# Patient Record
Sex: Female | Born: 1960 | Race: White | Hispanic: No | Marital: Married | Smoking: Current every day smoker
Health system: Southern US, Community
[De-identification: ages and names within clinical notes are randomized; demographics above are authoritative.]

## PROBLEM LIST (undated history)

## (undated) ENCOUNTER — Emergency Department (HOSPITAL_COMMUNITY): Discharge status: Absent without leave | Payer: 59

## (undated) DIAGNOSIS — F419 Anxiety disorder, unspecified: Secondary | ICD-10-CM

## (undated) DIAGNOSIS — J45909 Unspecified asthma, uncomplicated: Secondary | ICD-10-CM

## (undated) DIAGNOSIS — F329 Major depressive disorder, single episode, unspecified: Secondary | ICD-10-CM

## (undated) DIAGNOSIS — L509 Urticaria, unspecified: Secondary | ICD-10-CM

## (undated) DIAGNOSIS — L309 Dermatitis, unspecified: Secondary | ICD-10-CM

## (undated) DIAGNOSIS — F32A Depression, unspecified: Secondary | ICD-10-CM

## (undated) HISTORY — DX: Urticaria, unspecified: L50.9

## (undated) HISTORY — PX: TUBAL LIGATION: SHX77

## (undated) HISTORY — DX: Dermatitis, unspecified: L30.9

## (undated) HISTORY — DX: Unspecified asthma, uncomplicated: J45.909

## (undated) HISTORY — DX: Anxiety disorder, unspecified: F41.9

---

## 2001-12-15 ENCOUNTER — Encounter: Payer: Self-pay | Admitting: Obstetrics and Gynecology

## 2001-12-15 ENCOUNTER — Ambulatory Visit (HOSPITAL_COMMUNITY): Admission: RE | Admit: 2001-12-15 | Discharge: 2001-12-15 | Payer: Self-pay | Admitting: Internal Medicine

## 2005-05-24 ENCOUNTER — Ambulatory Visit (HOSPITAL_COMMUNITY): Admission: RE | Admit: 2005-05-24 | Discharge: 2005-05-24 | Payer: Self-pay | Admitting: Unknown Physician Specialty

## 2007-04-09 ENCOUNTER — Ambulatory Visit (HOSPITAL_COMMUNITY): Admission: RE | Admit: 2007-04-09 | Discharge: 2007-04-09 | Payer: Self-pay | Admitting: Unknown Physician Specialty

## 2008-10-06 ENCOUNTER — Ambulatory Visit (HOSPITAL_COMMUNITY): Admission: RE | Admit: 2008-10-06 | Discharge: 2008-10-06 | Payer: Self-pay | Admitting: Unknown Physician Specialty

## 2009-10-07 ENCOUNTER — Ambulatory Visit (HOSPITAL_COMMUNITY): Admission: RE | Admit: 2009-10-07 | Discharge: 2009-10-07 | Payer: Self-pay | Admitting: Unknown Physician Specialty

## 2009-10-20 ENCOUNTER — Ambulatory Visit (HOSPITAL_COMMUNITY): Admission: RE | Admit: 2009-10-20 | Discharge: 2009-10-20 | Payer: Self-pay | Admitting: Unknown Physician Specialty

## 2010-10-24 ENCOUNTER — Ambulatory Visit (HOSPITAL_COMMUNITY)
Admission: RE | Admit: 2010-10-24 | Discharge: 2010-10-24 | Payer: Self-pay | Source: Home / Self Care | Admitting: Unknown Physician Specialty

## 2011-09-27 ENCOUNTER — Other Ambulatory Visit (HOSPITAL_COMMUNITY): Payer: Self-pay | Admitting: Physician Assistant

## 2011-09-27 DIAGNOSIS — Z139 Encounter for screening, unspecified: Secondary | ICD-10-CM

## 2011-10-29 ENCOUNTER — Ambulatory Visit (HOSPITAL_COMMUNITY)
Admission: RE | Admit: 2011-10-29 | Discharge: 2011-10-29 | Disposition: A | Discharge status: Home / Self Care | Payer: 59 | Source: Ambulatory Visit | Attending: Physician Assistant | Admitting: Physician Assistant

## 2011-10-29 DIAGNOSIS — Z139 Encounter for screening, unspecified: Secondary | ICD-10-CM

## 2011-10-29 DIAGNOSIS — Z1231 Encounter for screening mammogram for malignant neoplasm of breast: Secondary | ICD-10-CM | POA: Insufficient documentation

## 2012-10-22 ENCOUNTER — Other Ambulatory Visit (HOSPITAL_COMMUNITY): Payer: Self-pay | Admitting: Physician Assistant

## 2012-10-22 DIAGNOSIS — Z139 Encounter for screening, unspecified: Secondary | ICD-10-CM

## 2012-11-03 ENCOUNTER — Ambulatory Visit (HOSPITAL_COMMUNITY)
Admission: RE | Admit: 2012-11-03 | Discharge: 2012-11-03 | Disposition: A | Discharge status: Home / Self Care | Payer: 59 | Source: Ambulatory Visit | Attending: Physician Assistant | Admitting: Physician Assistant

## 2012-11-03 DIAGNOSIS — Z1231 Encounter for screening mammogram for malignant neoplasm of breast: Secondary | ICD-10-CM | POA: Insufficient documentation

## 2012-11-03 DIAGNOSIS — Z139 Encounter for screening, unspecified: Secondary | ICD-10-CM

## 2013-03-25 ENCOUNTER — Encounter (INDEPENDENT_AMBULATORY_CARE_PROVIDER_SITE_OTHER): Payer: Self-pay | Admitting: *Deleted

## 2013-04-02 ENCOUNTER — Encounter (INDEPENDENT_AMBULATORY_CARE_PROVIDER_SITE_OTHER): Payer: Self-pay | Admitting: *Deleted

## 2013-04-02 ENCOUNTER — Telehealth (INDEPENDENT_AMBULATORY_CARE_PROVIDER_SITE_OTHER): Payer: Self-pay | Admitting: *Deleted

## 2013-04-02 ENCOUNTER — Other Ambulatory Visit (INDEPENDENT_AMBULATORY_CARE_PROVIDER_SITE_OTHER): Payer: Self-pay | Admitting: *Deleted

## 2013-04-02 DIAGNOSIS — Z1211 Encounter for screening for malignant neoplasm of colon: Secondary | ICD-10-CM

## 2013-04-02 NOTE — Telephone Encounter (Signed)
Patient needs movi prep 

## 2013-04-03 MED ORDER — PEG-KCL-NACL-NASULF-NA ASC-C 100 G PO SOLR: 1.0000 | Freq: Once | ORAL | Status: DC

## 2013-05-11 ENCOUNTER — Encounter (HOSPITAL_COMMUNITY): Payer: Self-pay | Admitting: Pharmacy Technician

## 2013-05-18 ENCOUNTER — Telehealth (INDEPENDENT_AMBULATORY_CARE_PROVIDER_SITE_OTHER): Payer: Self-pay | Admitting: *Deleted

## 2013-05-18 NOTE — Telephone Encounter (Signed)
agree

## 2013-05-18 NOTE — Telephone Encounter (Signed)
  Procedure: tcs  Reason/Indication:  screening  Has patient had this procedure before?  no  If so, when, by whom and where?    Is there a family history of colon cancer?  no  Who?  What age when diagnosed?    Is patient diabetic?   no      Does patient have prosthetic heart valve?  no  Do you have a pacemaker?  no  Has patient ever had endocarditis? no  Has patient had joint replacement within last 12 months?  no  Is patient on Coumadin, Plavix and/or Aspirin? no  Medications: goody powder daily, alprazolam 1 mg daily, zoloft 150 mg, vitamin, zyrtec daily  Allergies: Ceftin, codeine, PCN  Medication Adjustment: goody powder 2 days  Procedure date & time: 06/17/13 at 730

## 2013-06-17 ENCOUNTER — Ambulatory Visit (HOSPITAL_COMMUNITY)
Admission: RE | Admit: 2013-06-17 | Discharge: 2013-06-17 | Disposition: A | Discharge status: Home / Self Care | Payer: 59 | Source: Ambulatory Visit | Attending: Internal Medicine | Admitting: Internal Medicine

## 2013-06-17 ENCOUNTER — Encounter (HOSPITAL_COMMUNITY): Payer: Self-pay | Admitting: *Deleted

## 2013-06-17 ENCOUNTER — Encounter (HOSPITAL_COMMUNITY)
Admission: RE | Disposition: A | Discharge status: Home / Self Care | Payer: Self-pay | Source: Ambulatory Visit | Attending: Internal Medicine

## 2013-06-17 DIAGNOSIS — Z1211 Encounter for screening for malignant neoplasm of colon: Secondary | ICD-10-CM

## 2013-06-17 DIAGNOSIS — D126 Benign neoplasm of colon, unspecified: Secondary | ICD-10-CM | POA: Insufficient documentation

## 2013-06-17 DIAGNOSIS — Q438 Other specified congenital malformations of intestine: Secondary | ICD-10-CM | POA: Insufficient documentation

## 2013-06-17 DIAGNOSIS — K6389 Other specified diseases of intestine: Secondary | ICD-10-CM | POA: Insufficient documentation

## 2013-06-17 DIAGNOSIS — Z83719 Family history of colon polyps, unspecified: Secondary | ICD-10-CM | POA: Insufficient documentation

## 2013-06-17 DIAGNOSIS — K633 Ulcer of intestine: Secondary | ICD-10-CM

## 2013-06-17 DIAGNOSIS — Z8371 Family history of colonic polyps: Secondary | ICD-10-CM | POA: Insufficient documentation

## 2013-06-17 HISTORY — PX: COLONOSCOPY: SHX5424

## 2013-06-17 HISTORY — DX: Depression, unspecified: F32.A

## 2013-06-17 HISTORY — DX: Major depressive disorder, single episode, unspecified: F32.9

## 2013-06-17 SURGERY — COLONOSCOPY
Anesthesia: Moderate Sedation

## 2013-06-17 MED ORDER — MEPERIDINE HCL 50 MG/ML IJ SOLN
INTRAMUSCULAR | Status: AC
  Filled 2013-06-17: qty 1

## 2013-06-17 MED ORDER — MIDAZOLAM HCL 5 MG/5ML IJ SOLN
INTRAMUSCULAR | Status: DC | PRN
  Administered 2013-06-17: 1 mg via INTRAVENOUS
  Administered 2013-06-17: 3 mg via INTRAVENOUS
  Administered 2013-06-17 (×3): 2 mg via INTRAVENOUS

## 2013-06-17 MED ORDER — MEPERIDINE HCL 50 MG/ML IJ SOLN
INTRAMUSCULAR | Status: DC | PRN
  Administered 2013-06-17 (×2): 25 mg via INTRAVENOUS

## 2013-06-17 MED ORDER — STERILE WATER FOR IRRIGATION IR SOLN
Status: DC | PRN
  Administered 2013-06-17: 07:00:00

## 2013-06-17 MED ORDER — SODIUM CHLORIDE 0.9 % IV SOLN
INTRAVENOUS | Status: DC
  Administered 2013-06-17: 1000 mL via INTRAVENOUS

## 2013-06-17 MED ORDER — MIDAZOLAM HCL 5 MG/5ML IJ SOLN
INTRAMUSCULAR | Status: AC
  Filled 2013-06-17: qty 10

## 2013-06-17 NOTE — Op Note (Signed)
COLONOSCOPY PROCEDURE REPORT  PATIENT:  Christina Esparza  MR#:  [id number] Birthdate:  Jan 03, 1961, 52 y.o., female Endoscopist:  Dr. Malissa Hippo, MD Referred By:  Ms. Tanja Port, FNP Procedure Date: 06/17/2013  Procedure:   Colonoscopy  Indications:  Patient is 52 year-old Caucasian female who is undergoing average risk screening colonoscopy. Family history is negative for CRC but her mother has had colonic polyps removed.  Informed Consent:  The procedure and risks were reviewed with the patient and informed consent was obtained.  Medications:  Demerol 50 mg IV Versed 10 mg IV  Description of procedure:  After a digital rectal exam was performed, that colonoscope was advanced from the anus through the rectum and colon to the area of the cecum, ileocecal valve and appendiceal orifice. The cecum was deeply intubated. These structures were well-seen and photographed for the record. From the level of the cecum and ileocecal valve, the scope was slowly and cautiously withdrawn. The mucosal surfaces were carefully surveyed utilizing scope tip to flexion to facilitate fold flattening as needed. The scope was pulled down into the rectum where a thorough exam including retroflexion was performed.  Findings:   Prep satisfactory. Tortuous sigmoid colon. Single small cecal erosion possibly secondary to aspirin use. Two small polyps ablated via cold biopsy and submitted together. These are located at the descending and sigmoid colon. Normal rectal mucosa and anal rectal junction. Slim colonoscope used in order to advance scope through tortuous sigmoid colon.   Therapeutic/Diagnostic Maneuvers Performed:  See above  Complications:  None  Cecal Withdrawal Time:  17 minutes  Impression:  Examination performed to cecum. Single small cecal erosion possibly secondary to aspirin use. Two small polyps ablated via cold biopsy and submitted together(descending and sigmoid  colon).   Recommendations:  Standard instructions given. I will contact patient with biopsy results and further recommendations.  Christina Esparza  06/17/2013 8:18 AM  CC: Dr. Phillips Odor, Chancy Hurter, MD & Dr. Bonnetta Barry ref. provider found

## 2013-06-17 NOTE — H&P (Signed)
Christina Esparza is an 52 y.o. female.   Chief Complaint: Patient is here for colonoscopy. HPI: Patient is-year-old Caucasian female with screening examination. She denies rectal bleeding tarry or constipation abdominal pain. Family history is negative for colorectal carcinoma her mother for a few polyps removed.  Past Medical History  Diagnosis Date  . Depression     Past Surgical History  Procedure Laterality Date  . Tubal ligation      History reviewed. No pertinent family history. Social History:  reports that she has been smoking Cigarettes.  She has been smoking about 1.50 packs per day. She does not have any smokeless tobacco history on file. She reports that she does not drink alcohol or use illicit drugs.  Allergies:  Allergies  Allergen Reactions  . Ceftin (Cefuroxime) Itching  . Codeine Nausea And Vomiting  . Penicillins     Felt like she was choking    Medications Prior to Admission  Medication Sig Dispense Refill  . ALPRAZolam (XANAX) 0.5 MG tablet Take 0.5 mg by mouth every 6 (six) hours as needed for anxiety.      . Aspirin-Acetaminophen (GOODYS BODY PAIN PO) Take 1 packet by mouth every 6 (six) hours as needed (pain).      . cetirizine (ZYRTEC) 10 MG tablet Take 10 mg by mouth daily.      . sertraline (ZOLOFT) 100 MG tablet Take 150 mg by mouth daily.        No results found for this or any previous visit (from the past 48 hour(s)). No results found.  ROS  Blood pressure 116/71, pulse 70, temperature 97.7 F (36.5 C), temperature source Oral, resp. rate 19, height 5\' 1"  (1.549 m), weight 135 lb (61.236 kg), SpO2 92.00%. Physical Exam  Constitutional: She appears well-developed and well-nourished.  HENT:  Mouth/Throat: Oropharynx is clear and moist.  Eyes: Conjunctivae are normal. No scleral icterus.  Neck: No thyromegaly present.  Cardiovascular: Normal rate, regular rhythm and normal heart sounds.   No murmur heard. Respiratory: Effort normal and  breath sounds normal.  GI: Soft. She exhibits no distension and no mass. There is no tenderness.  Musculoskeletal: She exhibits no edema.  Lymphadenopathy:    She has no cervical adenopathy.  Neurological: She is alert.  Skin: Skin is warm and dry.     Assessment/Plan Average risk screening colonoscopy.  REHMAN,NAJEEB U 06/17/2013, 7:25 AM

## 2013-06-18 ENCOUNTER — Encounter (HOSPITAL_COMMUNITY): Payer: Self-pay | Admitting: Internal Medicine

## 2013-06-22 ENCOUNTER — Encounter (INDEPENDENT_AMBULATORY_CARE_PROVIDER_SITE_OTHER): Payer: Self-pay | Admitting: *Deleted

## 2013-06-25 ENCOUNTER — Telehealth (INDEPENDENT_AMBULATORY_CARE_PROVIDER_SITE_OTHER): Payer: Self-pay | Admitting: *Deleted

## 2013-06-25 NOTE — Telephone Encounter (Signed)
Christina Esparza had a TCS last Wed., 06/17/13, and is having a lot of gas. She is having trouble passing gas at times, her stomash just rolls. Would like to know what she should do. The return phone number is [phone].

## 2013-06-25 NOTE — Telephone Encounter (Signed)
She is passing gas at this time. She does not need to take Gas X.

## 2013-10-12 ENCOUNTER — Other Ambulatory Visit (HOSPITAL_COMMUNITY): Payer: Self-pay | Admitting: Unknown Physician Specialty

## 2013-10-12 DIAGNOSIS — Z139 Encounter for screening, unspecified: Secondary | ICD-10-CM

## 2013-11-05 ENCOUNTER — Ambulatory Visit (HOSPITAL_COMMUNITY)
Admission: RE | Admit: 2013-11-05 | Discharge: 2013-11-05 | Disposition: A | Discharge status: Home / Self Care | Payer: 59 | Source: Ambulatory Visit | Attending: Unknown Physician Specialty | Admitting: Unknown Physician Specialty

## 2013-11-05 DIAGNOSIS — Z1231 Encounter for screening mammogram for malignant neoplasm of breast: Secondary | ICD-10-CM | POA: Insufficient documentation

## 2013-11-05 DIAGNOSIS — Z139 Encounter for screening, unspecified: Secondary | ICD-10-CM

## 2013-12-15 ENCOUNTER — Telehealth (INDEPENDENT_AMBULATORY_CARE_PROVIDER_SITE_OTHER): Payer: Self-pay | Admitting: *Deleted

## 2013-12-15 NOTE — Telephone Encounter (Addendum)
Christina Esparza has been having gas since her las TCS. It feels like a knot of gas in her right side rolling around. It starts up right after she eats and has tried everything OTC. Her return phone number is [phone].

## 2013-12-15 NOTE — Telephone Encounter (Signed)
She needs upper abdominal ultrasound to rule out cholelithiasis. If ultrasound is negative she will need office visit. Her colonoscopy was 6 months ago; doubt that her flatulence has anything to do with that procedure. Please call patient

## 2013-12-15 NOTE — Telephone Encounter (Signed)
Forwarded to [place] for review/ recommendation.

## 2013-12-15 NOTE — Telephone Encounter (Signed)
I called the patient to give to her the recommendations per Dr.Rehman. She states that she already had trouble with constipation prior to her Colonoscopy. Since the procedure, when she eats she has pain on her right side and actually feels the stool or something per the patient moving. Eventually she has a BM , and feels better. Then the cycle starts all over again. She states that she has only taken OTC medications for Gas, nothing for Constipation. I ask the patient to take Colace 2 tonight , and that I would address this with Dr.Rehman as he may have further recommendations, Patient will be called tomorrow.

## 2013-12-16 NOTE — Telephone Encounter (Signed)
Per Dr.Rehman the patient may take 1/2 - 1 scoop daily of Miralax. Patient is to let us know via a progress report how she is doing, if the pain continues we will precede with the ultrasound. Patient was called and made aware.

## 2013-12-17 NOTE — Telephone Encounter (Signed)
Patient symptoms possibly related to constipation. She will try MiraLax. Please see Christina Esparza,s note for details the

## 2013-12-28 ENCOUNTER — Telehealth (INDEPENDENT_AMBULATORY_CARE_PROVIDER_SITE_OTHER): Payer: Self-pay | Admitting: *Deleted

## 2013-12-28 NOTE — Telephone Encounter (Signed)
Tanaiya said she needed to speak with [name]. She was to return her all a couple a weeks ago. Her return phone number is [phone]. A telephone encounter is noted for date 12/15/13.

## 2013-12-29 NOTE — Telephone Encounter (Signed)
Azaela returned [name]'s call. If [name] would please try her back.

## 2013-12-29 NOTE — Telephone Encounter (Signed)
Patient was called and I rec'd her voice mail. A message was left for her to call the office back.

## 2013-12-30 NOTE — Telephone Encounter (Signed)
Patient can take MiraLax up to one scoop daily. Please note that she is taking fiber supplement as well.

## 2013-12-30 NOTE — Telephone Encounter (Signed)
Patient states that she has been taking 1/2 scoop of the Miralax daily. It seems to be working. When she eats she does feel a knot/spasm but she feels that the knot has moved.. She is having bowel movements , and she is also able to pass gas now. She also ask if she should take more of the Miralax. Dr.Rehman's note reviewed and the patient ws advised that he had states that she could use 1/2 to 1 scoop. If further recommendations, patient will be called back.

## 2013-12-31 NOTE — Telephone Encounter (Signed)
Patient was called and made aware. 

## 2014-09-29 ENCOUNTER — Other Ambulatory Visit (HOSPITAL_COMMUNITY): Payer: Self-pay | Admitting: Family Medicine

## 2014-09-29 ENCOUNTER — Ambulatory Visit (HOSPITAL_COMMUNITY)
Admission: RE | Admit: 2014-09-29 | Discharge: 2014-09-29 | Disposition: A | Discharge status: Home / Self Care | Payer: 59 | Source: Ambulatory Visit | Attending: Family Medicine | Admitting: Family Medicine

## 2014-09-29 DIAGNOSIS — R05 Cough: Secondary | ICD-10-CM | POA: Insufficient documentation

## 2014-09-29 DIAGNOSIS — R0989 Other specified symptoms and signs involving the circulatory and respiratory systems: Secondary | ICD-10-CM | POA: Diagnosis not present

## 2014-09-29 DIAGNOSIS — J069 Acute upper respiratory infection, unspecified: Secondary | ICD-10-CM

## 2014-10-22 ENCOUNTER — Other Ambulatory Visit (HOSPITAL_COMMUNITY): Payer: Self-pay | Admitting: Unknown Physician Specialty

## 2014-10-22 DIAGNOSIS — Z1231 Encounter for screening mammogram for malignant neoplasm of breast: Secondary | ICD-10-CM

## 2014-11-08 ENCOUNTER — Ambulatory Visit (HOSPITAL_COMMUNITY)
Admission: RE | Admit: 2014-11-08 | Discharge: 2014-11-08 | Disposition: A | Discharge status: Home / Self Care | Payer: 59 | Source: Ambulatory Visit | Attending: Unknown Physician Specialty | Admitting: Unknown Physician Specialty

## 2014-11-08 DIAGNOSIS — Z1231 Encounter for screening mammogram for malignant neoplasm of breast: Secondary | ICD-10-CM | POA: Insufficient documentation

## 2015-05-16 ENCOUNTER — Encounter (INDEPENDENT_AMBULATORY_CARE_PROVIDER_SITE_OTHER): Payer: Self-pay | Admitting: Internal Medicine

## 2015-05-16 ENCOUNTER — Ambulatory Visit (INDEPENDENT_AMBULATORY_CARE_PROVIDER_SITE_OTHER): Payer: 59 | Admitting: Internal Medicine

## 2015-05-16 VITALS — BP 104/58 | HR 72 | Temp 98.1°F | Ht 61.0 in | Wt 127.7 lb

## 2015-05-16 DIAGNOSIS — A059 Bacterial foodborne intoxication, unspecified: Secondary | ICD-10-CM | POA: Diagnosis not present

## 2015-05-16 NOTE — Patient Instructions (Signed)
OV in 4 weeks.  

## 2015-05-16 NOTE — Progress Notes (Signed)
Subjective:    Patient ID: Christina Esparza, female    DOB: June 30, 1961, 54 y.o.   MRN: [id number]  HPI Presents today with c/o that 2 1/2 months ago, she ate eggs, and gravy. An hour she became sick and nauseated. The next day she had diarrhea. She says every thing she ate would come back up into her mouth. Symptoms lasted for about 2 weeks.  She saw her GYN  and was given Omeprazole which helped.  After starting the Omeprazole she felt better. She had diarrhea for 5 days.  Later she was treated for a sinus infection.She c/o headache, and some hearing loss.  Started on a Z pak by Dr. Hilma Favors.   She says her mouth was on fire. She followed up with her dentist Clotrimazole 10mg  which helped. Her appetite is good.  There has been no recent diarrhea.  She has no GI complaints today.       06/17/2013:  Colonoscopy  Indications: Patient is 54 year-old Caucasian female who is undergoing average risk screening colonoscopy. Family history is negative for CRC but her mother has had colonic polyps removed.  Impression:  Examination performed to cecum. Single small cecal erosion possibly secondary to aspirin use. Two small polyps ablated via cold biopsy and submitted together(descending and sigmoid colon).  She had 2 small polyps removed and there non-adenomatous. Results reviewed with patient. Will bring her back for her next exam in 7 years because of mother's history significant for advanced adenoma.   Review of Systems Married. No children. Unemployed.    Past Medical History  Diagnosis Date  . Depression     Past Surgical History  Procedure Laterality Date  . Tubal ligation    . Colonoscopy N/A 06/17/2013    Procedure: COLONOSCOPY;  Surgeon: Rogene Houston, MD;  Location: [facility];  Service: Endoscopy;  Laterality: N/A;  730    Allergies  Allergen Reactions  . Ceftin [Cefuroxime] Itching  . Codeine Nausea And Vomiting  . Levaquin [Levofloxacin In D5w]     dream  .  Penicillins     Felt like she was choking    Current Outpatient Prescriptions on File Prior to Visit  Medication Sig Dispense Refill  . ALPRAZolam (XANAX) 0.5 MG tablet Take 0.5 mg by mouth every 6 (six) hours as needed for anxiety.    . Aspirin-Acetaminophen (GOODYS BODY PAIN PO) Take 1 packet by mouth every 6 (six) hours as needed (pain).    . cetirizine (ZYRTEC) 10 MG tablet Take 10 mg by mouth daily.    . sertraline (ZOLOFT) 100 MG tablet Take 150 mg by mouth daily.     No current facility-administered medications on file prior to visit.     Objective:   Physical ExamBlood pressure 104/58, pulse 72, temperature 98.1 F (36.7 C), height 5\' 1"  (1.549 m), weight 127 lb 11.2 oz (57.924 kg).  Alert and oriented. Skin warm and dry. Oral mucosa is moist.   . Sclera anicteric, conjunctivae is pink. Thyroid not enlarged. No cervical lymphadenopathy. Lungs clear. Heart regular rate and rhythm.  Abdomen is soft. Bowel sounds are positive. No hepatomegaly. No abdominal masses felt. No tenderness.  No edema to lower extremities.         Assessment & Plan:  GERD. She possible had food poisoning. No problems at this times. She was recently treated for a sinus infection with a Z pak.  She is taking Omeprazole daily. OV 4 weeks to be sure all of  her symptoms have resolved.

## 2015-06-13 ENCOUNTER — Encounter (INDEPENDENT_AMBULATORY_CARE_PROVIDER_SITE_OTHER): Payer: Self-pay | Admitting: Internal Medicine

## 2015-06-13 ENCOUNTER — Ambulatory Visit (INDEPENDENT_AMBULATORY_CARE_PROVIDER_SITE_OTHER): Payer: 59 | Admitting: Internal Medicine

## 2015-06-13 VITALS — BP 118/54 | HR 80 | Temp 98.2°F | Ht 61.0 in | Wt 132.0 lb

## 2015-06-13 DIAGNOSIS — B370 Candidal stomatitis: Secondary | ICD-10-CM

## 2015-06-13 NOTE — Progress Notes (Signed)
   Subjective:    Patient ID: Christina Esparza, female    DOB: [birth year], 54 y.o.   MRN: [id number]  HPIHere today for f/u. She was last seen in June for nausea and diarrhea after eating eggs and gravy.   Her symptoms last for about 2 weeks. She says she thinks she had food poison.  When in office she had no GI complaints.   She tells me now she feels like she has a lot of acid in her mouth. She says her lips are cracked at the corners. She says she cannot eat tomatoes or drink diet Pepsi.  She has burning in her mouth and cheeks.  She tells me she saw Dr Hilma Favors and was treated for a sinus infection with a Z Pak.  She says her mouth has been on fire since she became sick in June. .  She followed up with her dentist Clotrimazole 10mg  which helped.  She has gained 5 pounds since her visit in June.  She denies acid reflux.   06/17/2013 Colonoscopy Indications: Patient is 54 year-old Caucasian female who is undergoing average risk screening colonoscopy. Family history is negative for CRC but her mother has had colonic polyps removed.  Impression:  Examination performed to cecum. Single small cecal erosion possibly secondary to aspirin use. Two small polyps ablated via cold biopsy and submitted together(descending and sigmoid colon).  Review of Systems Past Medical History  Diagnosis Date  . Depression     Past Surgical History  Procedure Laterality Date  . Tubal ligation    . Colonoscopy N/A 06/17/2013    Procedure: COLONOSCOPY;  Surgeon: Rogene Houston, MD;  Location: [facility];  Service: Endoscopy;  Laterality: N/A;  730    Allergies  Allergen Reactions  . Ceftin [Cefuroxime] Itching  . Codeine Nausea And Vomiting  . Levaquin [Levofloxacin In D5w]     dream  . Penicillins     Felt like she was choking    Current Outpatient Prescriptions on File Prior to Visit  Medication Sig Dispense Refill  . ALPRAZolam (XANAX) 0.5 MG tablet Take 0.5 mg by mouth every 6 (six) hours  as needed for anxiety.    . Aspirin-Acetaminophen (GOODYS BODY PAIN PO) Take 1 packet by mouth every 6 (six) hours as needed (pain).    . cetirizine (ZYRTEC) 10 MG tablet Take 10 mg by mouth daily.    Marland Kitchen omeprazole (PRILOSEC) 40 MG capsule Take 40 mg by mouth daily.    . sertraline (ZOLOFT) 100 MG tablet Take 150 mg by mouth daily.     No current facility-administered medications on file prior to visit.        Objective:   Physical ExamBlood pressure 118/54, pulse 80, temperature 98.2 F (36.8 C), height 5\' 1"  (1.549 m), weight 132 lb (59.875 kg).  Alert and oriented. Skin warm and dry. Oral mucosa is moist.  Angular cheilitis. White patches to tongues.   . Sclera anicteric, conjunctivae is pink. Thyroid not enlarged. No cervical lymphadenopathy. Lungs clear. Heart regular rate and rhythm.  Abdomen is soft. Bowel sounds are positive. No hepatomegaly. No abdominal masses felt. No tenderness.  No edema to lower extremities.         Assessment & Plan:  Soremouth syndrome.  Thrush. Magic Mouth Wash QID called to [facility] [facility].

## 2015-06-13 NOTE — Patient Instructions (Signed)
Magic Mouth Wash

## 2015-06-14 ENCOUNTER — Telehealth (INDEPENDENT_AMBULATORY_CARE_PROVIDER_SITE_OTHER): Payer: Self-pay | Admitting: *Deleted

## 2015-06-14 NOTE — Telephone Encounter (Signed)
Parish seen Terri yesterday and she forgot to call her medicines in. The return phone number is [phone].

## 2015-06-14 NOTE — Telephone Encounter (Signed)
I called her medicine in yesterday as I said I would do

## 2015-08-05 ENCOUNTER — Other Ambulatory Visit (INDEPENDENT_AMBULATORY_CARE_PROVIDER_SITE_OTHER): Payer: Self-pay | Admitting: Internal Medicine

## 2015-08-24 ENCOUNTER — Encounter (INDEPENDENT_AMBULATORY_CARE_PROVIDER_SITE_OTHER): Payer: Self-pay | Admitting: Internal Medicine

## 2015-08-24 ENCOUNTER — Ambulatory Visit (INDEPENDENT_AMBULATORY_CARE_PROVIDER_SITE_OTHER): Payer: 59 | Admitting: Internal Medicine

## 2015-08-24 VITALS — BP 126/70 | HR 84 | Temp 97.2°F | Ht 61.0 in | Wt 132.0 lb

## 2015-08-24 DIAGNOSIS — K5909 Other constipation: Secondary | ICD-10-CM

## 2015-08-24 DIAGNOSIS — F419 Anxiety disorder, unspecified: Secondary | ICD-10-CM | POA: Insufficient documentation

## 2015-08-24 DIAGNOSIS — F411 Generalized anxiety disorder: Secondary | ICD-10-CM | POA: Diagnosis not present

## 2015-08-24 MED ORDER — LUBIPROSTONE 24 MCG PO CAPS: 24.0000 ug | ORAL_CAPSULE | Freq: Two times a day (BID) | ORAL | Status: DC

## 2015-08-24 NOTE — Progress Notes (Signed)
   Subjective:    Patient ID: Christina Esparza, female    DOB: 1961/07/31, 54 y.o.   MRN: [id number]  HPI Presents today with c/o that for about a month. She tells me she is constipated.  She has a BM once a week if she takes something.  This is not a new symptom. She has tried Miralax for a couple of weeks but she was not satisfied with it. Miralax caused ger flatus. She has tried Colace for a week but did not help. She took a laxative but her stool was loose. No weight loss.  No melena or BRRB. She is not having any acid reflux.          06/17/2013 Colonoscopy  Indications: Patient is 54 year-old Caucasian female who is undergoing average risk screening colonoscopy. Family history is negative for CRC but her mother has had colonic polyps removed.  Impression:  Examination performed to cecum. Single small cecal erosion possibly secondary to aspirin use. Two small polyps ablated via cold biopsy and submitted together(descending and sigmoid colon). Biopsy She had 2 small polyps removed and there non-adenomatous. Results reviewed with patient. Will bring her back for her next exam in 7 years because of mother's history significant for advanced adenoma.     Review of Systems Past Medical History  Diagnosis Date  . Depression     Past Surgical History  Procedure Laterality Date  . Tubal ligation    . Colonoscopy N/A 06/17/2013    Procedure: COLONOSCOPY;  Surgeon: Rogene Houston, MD;  Location: [facility];  Service: Endoscopy;  Laterality: N/A;  730    Allergies  Allergen Reactions  . Ceftin [Cefuroxime] Itching  . Codeine Nausea And Vomiting  . Levaquin [Levofloxacin In D5w]     dream  . Penicillins     Felt like she was choking    Current Outpatient Prescriptions on File Prior to Visit  Medication Sig Dispense Refill  . ALPRAZolam (XANAX) 0.5 MG tablet Take 0.5 mg by mouth every 6 (six) hours as needed for anxiety.    . Aspirin-Acetaminophen (GOODYS BODY  PAIN PO) Take 1 packet by mouth every 6 (six) hours as needed (pain).    . cetirizine (ZYRTEC) 10 MG tablet Take 10 mg by mouth daily.    Marland Kitchen nystatin (MYCOSTATIN) 100000 UNIT/ML suspension USE 5 ML BY MOUTH EVERY 6 HOURS AS NEEDED 180 mL 0  . omeprazole (PRILOSEC) 40 MG capsule Take 40 mg by mouth daily.    . sertraline (ZOLOFT) 100 MG tablet Take 150 mg by mouth daily.     No current facility-administered medications on file prior to visit.        Objective:   Physical Exam Blood pressure 126/70, pulse 84, temperature 97.2 F (36.2 C), height 5\' 1"  (1.549 m), weight 132 lb (59.875 kg).  Alert and oriented. Skin warm and dry. Oral mucosa is moist.   . Sclera anicteric, conjunctivae is pink. Thyroid not enlarged. No cervical lymphadenopathy. Lungs clear. Heart regular rate and rhythm.  Abdomen is soft. Bowel sounds are positive. No hepatomegaly. No abdominal masses felt. No tenderness.  No edema to lower extremities.        Assessment & Plan:  Constipation. Am going to try her on Amitiza 8mg  BID.(Samples x 4 boxes). Rx for Amitiza 12mcg sent to her pharmacy. She will call with a PR in 2 weeks.

## 2015-08-24 NOTE — Patient Instructions (Signed)
Amitiza. Once a day. May use twice a day.

## 2015-09-07 ENCOUNTER — Telehealth (INDEPENDENT_AMBULATORY_CARE_PROVIDER_SITE_OTHER): Payer: Self-pay | Admitting: *Deleted

## 2015-09-07 NOTE — Telephone Encounter (Signed)
Christina Esparza would like to know if Christina Esparza has any more samples of Amitza 8? She is still itching and isn't sure it was that now.  The medication she changed to, isn't working as good. The return phone number is [phone] or [phone].

## 2015-09-09 NOTE — Telephone Encounter (Signed)
Samples of Amitiza to patient x 3 boxes

## 2015-09-09 NOTE — Telephone Encounter (Signed)
Samples of Amitiza to front desk. I spoke with patient

## 2015-09-13 ENCOUNTER — Encounter (INDEPENDENT_AMBULATORY_CARE_PROVIDER_SITE_OTHER): Payer: Self-pay | Admitting: *Deleted

## 2015-09-13 NOTE — Telephone Encounter (Signed)
Advised to try Miralax and stop the Amitiza

## 2015-09-13 NOTE — Telephone Encounter (Signed)
This encounter was created in error - please disregard.

## 2015-09-13 NOTE — Telephone Encounter (Addendum)
[place] has started burning/stinging and itching again after starting the Amitiza.  The return phone number is [phone].

## 2015-10-05 ENCOUNTER — Other Ambulatory Visit (HOSPITAL_COMMUNITY): Payer: Self-pay | Admitting: Unknown Physician Specialty

## 2015-10-05 DIAGNOSIS — Z1231 Encounter for screening mammogram for malignant neoplasm of breast: Secondary | ICD-10-CM

## 2015-11-01 ENCOUNTER — Ambulatory Visit (INDEPENDENT_AMBULATORY_CARE_PROVIDER_SITE_OTHER): Payer: 59 | Admitting: Internal Medicine

## 2015-11-01 ENCOUNTER — Encounter (INDEPENDENT_AMBULATORY_CARE_PROVIDER_SITE_OTHER): Payer: Self-pay | Admitting: *Deleted

## 2015-11-01 ENCOUNTER — Encounter (INDEPENDENT_AMBULATORY_CARE_PROVIDER_SITE_OTHER): Payer: Self-pay | Admitting: Internal Medicine

## 2015-11-01 VITALS — BP 130/80 | HR 78 | Temp 98.2°F | Resp 18 | Ht 61.0 in | Wt 133.5 lb

## 2015-11-01 DIAGNOSIS — K219 Gastro-esophageal reflux disease without esophagitis: Secondary | ICD-10-CM

## 2015-11-01 DIAGNOSIS — R1319 Other dysphagia: Secondary | ICD-10-CM

## 2015-11-01 DIAGNOSIS — K5900 Constipation, unspecified: Secondary | ICD-10-CM

## 2015-11-01 DIAGNOSIS — R1314 Dysphagia, pharyngoesophageal phase: Secondary | ICD-10-CM

## 2015-11-01 DIAGNOSIS — R1310 Dysphagia, unspecified: Secondary | ICD-10-CM

## 2015-11-01 NOTE — Progress Notes (Signed)
Presenting complaint;  Intermittent heartburn and epigastric pain and solid food dysphagia. Follow-up for chronic constipation.  Subjective:  Patient is 11 old Caucasian female who is here for scheduled visit. She was last seen on 08/24/2015 for constipation and given prescription for Amitiza. It work but she developed pins and needles all over her body and had to stopped this medication. She was then given Linzess by her PCP and every time she took it it made her sick on his stomach. She therefore stopped this medication and now is using OTC laxative on as-needed basis. She states that she take this medication she has a normal bowel movement and she is not experiencing any side effects. She has had problems with heartburn and epigastric pain ever since she had food poisoning in March 2016. She had nausea vomiting and diarrhea. While her acute symptoms are resolved she had postprandial regurgitation and vomiting. She also had rawness in her throat and retrosternal area. She was begun on omeprazole by Dr. Santo Held. She also be treated multiple times with Magic mouthwash and once with Mycostatin. Rawness in her throat has resolved but she has burning and retrosternal area couple of times a week and epigastric pain. She has some burping. For the last 3 months she has noted dysphagia primarily with solids. She points to lower sternal area site of bolus obstruction. She denies melena anorexia or weight loss. Last colonoscopy was in July 2014.    Current Medications: Outpatient Encounter Prescriptions as of 11/01/2015  Medication Sig  . ALPRAZolam (XANAX) 0.5 MG tablet Take 0.5 mg by mouth every 6 (six) hours as needed for anxiety.  . cetirizine (ZYRTEC) 10 MG tablet Take 10 mg by mouth daily.  . fexofenadine (ALLEGRA) 180 MG tablet Take 180 mg by mouth daily.  Marland Kitchen omeprazole (PRILOSEC) 40 MG capsule Take 40 mg by mouth daily.  Marland Kitchen OVER THE COUNTER MEDICATION Colon Clenz - Herbal Colon Formula -  Patient states that she takes 1 by mouth every other day.  . sertraline (ZOLOFT) 100 MG tablet Take 150 mg by mouth daily.  . SUMAtriptan (IMITREX) 50 MG tablet TK 1 T PO D PRF MIGRAINE  . [DISCONTINUED] lubiprostone (AMITIZA) 24 MCG capsule Take 1 capsule (24 mcg total) by mouth 2 (two) times daily with a meal. (Patient not taking: Reported on 11/01/2015)  . [DISCONTINUED] nystatin (MYCOSTATIN) 100000 UNIT/ML suspension USE 5 ML BY MOUTH EVERY 6 HOURS AS NEEDED (Patient not taking: Reported on 08/24/2015)   No facility-administered encounter medications on file as of 11/01/2015.     Objective: Blood pressure 130/80, pulse 78, temperature 98.2 F (36.8 C), temperature source Oral, resp. rate 18, height 5\' 1"  (1.549 m), weight 133 lb 8 oz (60.555 kg). Patient is alert and in no acute distress. Conjunctiva is pink. Sclera is nonicteric Oropharyngeal mucosa is normal. No neck masses or thyromegaly noted. Cardiac exam with regular rhythm normal S1 and S2. No murmur or gallop noted. Lungs are clear to auscultation. Abdomen is symmetrical soft and nontender without organomegaly or masses.  No LE edema or clubbing noted.   Assessment:  #1. GERD. Symptoms started back in March this year when she suffered an episode of gastroenteritis or food poisoning. She has been on PPI for 7 months and symptom control is not satisfactory as she still having intermittent heartburn and epigastric pain. #2. Solid food dysphagia. She may have developed esophageal ring and/or stricture. #3. Chronic constipation. Amitiza work but she developed side effects and similarly Linzess made  her sick. She is doing well with when necessary laxative but she needs to increase intake of fiber rich foods. Last colonoscopy was in July 2014 with removal of 2 small polyps and these were hyperplastic. Next colonoscopy due in July 2021.   Plan:  Esophagogastroduodenoscopy with esophageal dilation in near future. Patient advised  to increase intake of fiber rich foods. Office visit in 6 months.

## 2015-11-01 NOTE — Patient Instructions (Signed)
High fiber diet. Use Colon Clenz on as-needed basis or every third day. Esophagogastroduodenoscopy and esophageal dilation to be scheduled.

## 2015-11-10 ENCOUNTER — Ambulatory Visit (HOSPITAL_COMMUNITY)
Admission: RE | Admit: 2015-11-10 | Discharge: 2015-11-10 | Disposition: A | Discharge status: Home / Self Care | Payer: 59 | Source: Ambulatory Visit | Attending: Unknown Physician Specialty | Admitting: Unknown Physician Specialty

## 2015-11-10 DIAGNOSIS — Z1231 Encounter for screening mammogram for malignant neoplasm of breast: Secondary | ICD-10-CM | POA: Diagnosis not present

## 2015-11-15 ENCOUNTER — Encounter (HOSPITAL_COMMUNITY)
Admission: RE | Disposition: A | Discharge status: Home / Self Care | Payer: Self-pay | Source: Ambulatory Visit | Attending: Internal Medicine

## 2015-11-15 ENCOUNTER — Ambulatory Visit (HOSPITAL_COMMUNITY)
Admission: RE | Admit: 2015-11-15 | Discharge: 2015-11-15 | Disposition: A | Discharge status: Home / Self Care | Payer: 59 | Source: Ambulatory Visit | Attending: Internal Medicine | Admitting: Internal Medicine

## 2015-11-15 ENCOUNTER — Encounter (HOSPITAL_COMMUNITY): Payer: Self-pay | Admitting: *Deleted

## 2015-11-15 DIAGNOSIS — K2210 Ulcer of esophagus without bleeding: Secondary | ICD-10-CM | POA: Diagnosis not present

## 2015-11-15 DIAGNOSIS — K449 Diaphragmatic hernia without obstruction or gangrene: Secondary | ICD-10-CM | POA: Diagnosis not present

## 2015-11-15 DIAGNOSIS — Z79899 Other long term (current) drug therapy: Secondary | ICD-10-CM | POA: Insufficient documentation

## 2015-11-15 DIAGNOSIS — F329 Major depressive disorder, single episode, unspecified: Secondary | ICD-10-CM | POA: Diagnosis not present

## 2015-11-15 DIAGNOSIS — K259 Gastric ulcer, unspecified as acute or chronic, without hemorrhage or perforation: Secondary | ICD-10-CM | POA: Diagnosis not present

## 2015-11-15 DIAGNOSIS — K269 Duodenal ulcer, unspecified as acute or chronic, without hemorrhage or perforation: Secondary | ICD-10-CM | POA: Diagnosis not present

## 2015-11-15 DIAGNOSIS — K210 Gastro-esophageal reflux disease with esophagitis: Secondary | ICD-10-CM | POA: Diagnosis not present

## 2015-11-15 DIAGNOSIS — R1013 Epigastric pain: Secondary | ICD-10-CM | POA: Diagnosis not present

## 2015-11-15 DIAGNOSIS — F419 Anxiety disorder, unspecified: Secondary | ICD-10-CM | POA: Diagnosis not present

## 2015-11-15 DIAGNOSIS — K2960 Other gastritis without bleeding: Secondary | ICD-10-CM | POA: Diagnosis not present

## 2015-11-15 DIAGNOSIS — R1310 Dysphagia, unspecified: Secondary | ICD-10-CM | POA: Diagnosis not present

## 2015-11-15 DIAGNOSIS — K222 Esophageal obstruction: Secondary | ICD-10-CM | POA: Diagnosis not present

## 2015-11-15 DIAGNOSIS — F17210 Nicotine dependence, cigarettes, uncomplicated: Secondary | ICD-10-CM | POA: Diagnosis not present

## 2015-11-15 HISTORY — PX: ESOPHAGEAL DILATION: SHX303

## 2015-11-15 HISTORY — PX: ESOPHAGOGASTRODUODENOSCOPY: SHX5428

## 2015-11-15 LAB — COMPREHENSIVE METABOLIC PANEL
ALBUMIN: 4.1 g/dL (ref 3.5–5.0)
ALK PHOS: 54 U/L (ref 38–126)
ALT: 10 U/L — AB (ref 14–54)
AST: 16 U/L (ref 15–41)
Anion gap: 7 (ref 5–15)
BUN: 11 mg/dL (ref 6–20)
CHLORIDE: 108 mmol/L (ref 101–111)
CO2: 26 mmol/L (ref 22–32)
CREATININE: 0.73 mg/dL (ref 0.44–1.00)
Calcium: 8.9 mg/dL (ref 8.9–10.3)
GFR calc non Af Amer: >60 mL/min (ref >60)
GLUCOSE: 76 mg/dL (ref 65–99)
Potassium: 3.9 mmol/L (ref 3.5–5.1)
SODIUM: 141 mmol/L (ref 135–145)
Total Bilirubin: 0.4 mg/dL (ref 0.3–1.2)
Total Protein: 6.9 g/dL (ref 6.5–8.1)

## 2015-11-15 LAB — KOH PREP: KOH Prep: NONE SEEN

## 2015-11-15 SURGERY — EGD (ESOPHAGOGASTRODUODENOSCOPY)
Anesthesia: Moderate Sedation

## 2015-11-15 MED ORDER — STERILE WATER FOR IRRIGATION IR SOLN
Status: DC | PRN
  Administered 2015-11-15: 14:00:00

## 2015-11-15 MED ORDER — MIDAZOLAM HCL 5 MG/5ML IJ SOLN
INTRAMUSCULAR | Status: DC | PRN
  Administered 2015-11-15: 2 mg via INTRAVENOUS
  Administered 2015-11-15: 1 mg via INTRAVENOUS
  Administered 2015-11-15: 3 mg via INTRAVENOUS
  Administered 2015-11-15 (×4): 2 mg via INTRAVENOUS

## 2015-11-15 MED ORDER — SODIUM CHLORIDE 0.9 % IJ SOLN
INTRAMUSCULAR | Status: AC
  Filled 2015-11-15: qty 3

## 2015-11-15 MED ORDER — MIDAZOLAM HCL 5 MG/5ML IJ SOLN
INTRAMUSCULAR | Status: AC
  Filled 2015-11-15: qty 5

## 2015-11-15 MED ORDER — MEPERIDINE HCL 50 MG/ML IJ SOLN
INTRAMUSCULAR | Status: AC
  Filled 2015-11-15: qty 1

## 2015-11-15 MED ORDER — MEPERIDINE HCL 50 MG/ML IJ SOLN
INTRAMUSCULAR | Status: DC | PRN
  Administered 2015-11-15 (×2): 25 mg via INTRAVENOUS

## 2015-11-15 MED ORDER — PANTOPRAZOLE SODIUM 40 MG PO TBEC: 40.0000 mg | DELAYED_RELEASE_TABLET | Freq: Two times a day (BID) | ORAL | Status: DC

## 2015-11-15 MED ORDER — BUTAMBEN-TETRACAINE-BENZOCAINE 2-2-14 % EX AERO
INHALATION_SPRAY | CUTANEOUS | Status: DC | PRN
  Administered 2015-11-15: 2 via TOPICAL

## 2015-11-15 MED ORDER — MIDAZOLAM HCL 5 MG/5ML IJ SOLN
INTRAMUSCULAR | Status: AC
  Filled 2015-11-15: qty 10

## 2015-11-15 MED ORDER — SODIUM CHLORIDE 0.9 % IV SOLN
INTRAVENOUS | Status: DC
  Administered 2015-11-15: 14:00:00 via INTRAVENOUS

## 2015-11-15 NOTE — Op Note (Signed)
EGD PROCEDURE REPORT  PATIENT:  Christina Esparza  MR#:  [id number] Birthdate:  [birth year], 54 y.o., female Endoscopist:  Dr. Rogene Houston, MD Referred By:  Dr. Procedure Date: 11/15/2015  Procedure:   EGD duodenal stricture dilation.  Indications:  Patient is 81 old Caucasian female who presents with intermittent heartburn regurgitation solid food dysphagia as well as epigastric pain. She states symptoms started about 10 months ago when she had food poisoning but it has never fully recovered. She has been on omeprazole for over 6 months.            Informed Consent:  The risks, benefits, alternatives & imponderables which include, but are not limited to, bleeding, infection, perforation, drug reaction and potential missed lesion have been reviewed.  The potential for biopsy, lesion removal, esophageal dilation, etc. have also been discussed.  Questions have been answered.  All parties agreeable.  Please see history & physical in medical record for more information.  Medications:  Demerol 50 mg IV Versed 15 mg IV Cetacaine spray topically for oropharyngeal anesthesia  Description of procedure:  The endoscope was introduced through the mouth and advanced to the second portion of the duodenum without difficulty or limitations. The mucosal surfaces were surveyed very carefully during advancement of the scope and upon withdrawal.  Findings:   Esophagus: Mucosa of proximal middle segment was normal. Linear erosions noted involving distal 2 cm of esophageal mucosa. Was patchy coating of distal esophageal mucosa with whitish material.  GEJ:  36 cm Stomach:  Stomach was empty and distended very well with insufflation. Folds in the proximal stomach were normal. Examination mucosa gastric body was normal. Scattered antral erosions noted. Pyloric channel was patent. Angularis fundus and cardia were unremarkable. Duodenum:  Bulbar mucosa was normal. High-grade stricture noted at and below the duodenum  along with semi-lunar ulcer. Scope was slowly advanced across the stricture post dilation as reviewed below.  Therapeutic/Diagnostic Maneuvers Performed:   Brushing taken for KOH prep from distal esophagus. Post bulbar duodenal stricture was dilated with balloon dilator. Balloon.was advanced across the stricture under direct vision and insufflated to a diameter of 10 mm and subsequently to 12 mm and maintained for 60 seconds. Balloon was deflated and withdrawn. Post dilation now was able to pass the scope into second part of duodenum.  Complications:  none  EBL: minimal  Impression: Erosive reflux esophagitis involving distal 2 cm of esophagus. Brushing taken from mucosa of distal esophagus for KOH prep. Erosive antral gastritis. Post bulbar semilunar ulcer with high-grade stricture. This stricture was dilated with balloon dilator from 10 to 12 mm.  Recommendations:  H. pylori serology and comprehensive chemistry panel. Discontinue omeprazole. Pantoprazole 40 mg by mouth twice a day. Patient advised not to take OTC NSAIDs. I will be contacting patient with results of pending studies. Will plan to redilate duodenal stricture in 4-6 weeks.    REHMAN,NAJEEB U  11/15/2015  2:56 PM  CC: Dr. Hilma Favors, Betsy Coder, MD & Dr. Rayne Du ref. provider found

## 2015-11-15 NOTE — H&P (Signed)
Christina Esparza is an 54 y.o. female.   Chief Complaint: Patient's here for EGD and ED. HPI: She is 64 old Caucasian female who presents with three-month history of intermittent solid food dysphagia. She was in usual state of health until about 9 months ago when she developed nausea vomiting and diarrhea felt to be food poisoning. The diarrhea resolved she has remained with epigastric pain heartburn regurgitation rawness in her throat and dysphagia started few months ago as above. She has been on omeprazole since April 2016. She denies hematemesis melena nausea or vomiting.  Past Medical History  Diagnosis Date  . Depression   . Anxiety     Past Surgical History  Procedure Laterality Date  . Tubal ligation    . Colonoscopy N/A 06/17/2013    Procedure: COLONOSCOPY;  Surgeon: Rogene Houston, MD;  Location: [facility];  Service: Endoscopy;  Laterality: N/A;  730    Family History  Problem Relation Age of Onset  . Colon polyps Mother   . Colon cancer Neg Hx    Social History:  reports that she has been smoking Cigarettes.  She has been smoking about 1.50 packs per day. She has never used smokeless tobacco. She reports that she does not drink alcohol or use illicit drugs.  Allergies:  Allergies  Allergen Reactions  . Amitiza [Lubiprostone] Itching    Patient states that she experienced a pin sticking sensation, prickling all over.  . Linaclotide Nausea Only  . Ceftin [Cefuroxime] Itching  . Codeine Nausea And Vomiting  . Levaquin [Levofloxacin In D5w]     dream  . Penicillins     Felt like she was choking    Medications Prior to Admission  Medication Sig Dispense Refill  . ALPRAZolam (XANAX) 0.5 MG tablet Take 0.5 mg by mouth every 6 (six) hours as needed for anxiety.    . cetirizine (ZYRTEC) 10 MG tablet Take 10 mg by mouth daily.    Marland Kitchen omeprazole (PRILOSEC) 40 MG capsule Take 40 mg by mouth daily.    . sertraline (ZOLOFT) 100 MG tablet Take 150 mg by mouth daily.    .  fexofenadine (ALLEGRA) 180 MG tablet Take 180 mg by mouth daily.    Marland Kitchen OVER THE COUNTER MEDICATION Colon Clenz - Herbal Colon Formula - Patient states that she takes 1 by mouth every other day.    . SUMAtriptan (IMITREX) 50 MG tablet TK 1 T PO D PRF MIGRAINE  0    No results found for this or any previous visit (from the past 110 hour(s)). No results found.  ROS  Blood pressure 123/78, pulse 74, temperature 97.4 F (36.3 C), temperature source Oral, resp. rate 17, SpO2 96 %. Physical Exam  Constitutional: She appears well-developed and well-nourished.  HENT:  Mouth/Throat: Oropharynx is clear and moist.  Eyes: Conjunctivae are normal. No scleral icterus.  Neck: No thyromegaly present.  Cardiovascular: Normal rate, regular rhythm and normal heart sounds.   No murmur heard. Respiratory: Effort normal and breath sounds normal.  GI: Soft. She exhibits no distension and no mass. There is no tenderness.  Musculoskeletal: She exhibits no edema.  Lymphadenopathy:    She has no cervical adenopathy.  Neurological: She is alert.  Skin: Skin is warm and dry.     Assessment/Plan Solid food dysphagia. GERD symptoms and epigastric pain since episode of gastroenteritis over 9 months ago. EGD with ED.  REHMAN,NAJEEB U 11/15/2015, 2:00 PM

## 2015-11-15 NOTE — Discharge Instructions (Signed)
No aspirin or other NSAIDs. Discontinue omeprazole. Pantoprazole 40 mg by mouth 30 minutes before breakfast and evening meal daily. No driving for 24 hours. Physician will call with results of blood tests.  Gastrointestinal Endoscopy, Care After Refer to this sheet in the next few weeks. These instructions provide you with information on caring for yourself after your procedure. Your caregiver may also give you more specific instructions. Your treatment has been planned according to current medical practices, but problems sometimes occur. Call your caregiver if you have any problems or questions after your procedure. HOME CARE INSTRUCTIONS  If you were given medicine to help you relax (sedative), do not drive, operate machinery, or sign important documents for 24 hours.  Avoid alcohol and hot or warm beverages for the first 24 hours after the procedure.  Only take over-the-counter or prescription medicines for pain, discomfort, or fever as directed by your caregiver. You may resume taking your normal medicines unless your caregiver tells you otherwise. Ask your caregiver when you may resume taking medicines that may cause bleeding, such as aspirin, clopidogrel, or warfarin.  You may return to your normal diet and activities on the day after your procedure, or as directed by your caregiver. Walking may help to reduce any bloated feeling in your abdomen.  Drink enough fluids to keep your urine clear or pale yellow.  You may gargle with salt water if you have a sore throat. SEEK IMMEDIATE MEDICAL CARE IF:  You have severe nausea or vomiting.  You have severe abdominal pain, abdominal cramps that last longer than 6 hours, or abdominal swelling (distention).  You have severe shoulder or back pain.  You have trouble swallowing.  You have shortness of breath, your breathing is shallow, or you are breathing faster than normal.  You have a fever or a rapid heartbeat.  You vomit blood or  material that looks like coffee grounds.  You have bloody, black, or tarry stools. MAKE SURE YOU:  Understand these instructions.  Will watch your condition.  Will get help right away if you are not doing well or get worse.   This information is not intended to replace advice given to you by your health care provider. Make sure you discuss any questions you have with your health care provider.   Document Released: 06/19/2004 Document Revised: 11/26/2014 Document Reviewed: 02/05/2012 Elsevier Interactive Patient Education [employer].

## 2015-11-16 LAB — H. PYLORI ANTIBODY, IGG

## 2015-11-22 ENCOUNTER — Encounter (HOSPITAL_COMMUNITY): Payer: Self-pay | Admitting: Internal Medicine

## 2015-12-09 ENCOUNTER — Telehealth (INDEPENDENT_AMBULATORY_CARE_PROVIDER_SITE_OTHER): Payer: Self-pay | Admitting: *Deleted

## 2015-12-09 ENCOUNTER — Other Ambulatory Visit (INDEPENDENT_AMBULATORY_CARE_PROVIDER_SITE_OTHER): Payer: Self-pay | Admitting: *Deleted

## 2015-12-09 MED ORDER — OMEPRAZOLE 20 MG PO CPDR: 20.0000 mg | DELAYED_RELEASE_CAPSULE | Freq: Two times a day (BID) | ORAL | Status: DC

## 2015-12-09 NOTE — Telephone Encounter (Signed)
Per Dr.Rehman - May call change to Omeprazole 20 mg BID. This has been e scribed.

## 2015-12-09 NOTE — Telephone Encounter (Signed)
Per Dr.Rehman patient is to stop the Pantoprazole. She may resume the Omeprazole 40 mg take 1 by mouth BID. Patient was called and made aware.

## 2015-12-09 NOTE — Telephone Encounter (Signed)
Talked with the patient. She states that she had a procedure about 3 weeks ago. She was put on Pantoprazole 40 mg - she has been taking it twice a day. She states that she has experineced Chest Pain , Fast Heart Rate and weakness. I ask that she not take anymore until this is addressed with Dr.Rehman.

## 2015-12-28 ENCOUNTER — Telehealth (INDEPENDENT_AMBULATORY_CARE_PROVIDER_SITE_OTHER): Payer: Self-pay | Admitting: *Deleted

## 2015-12-28 NOTE — Telephone Encounter (Signed)
I would suggest she should not take NSAID until next EGD. She needs to talk with PCP about other treatment options. [name], please call patient.

## 2015-12-28 NOTE — Telephone Encounter (Signed)
Patient went to PCP today and was told she needed to take an antiflammatory medicine for arthritis -- PCP suggested mobic 15 mg, can she take this, if not what can she take -- please advise

## 2016-01-04 NOTE — Telephone Encounter (Signed)
Patient was called and made aware via a message that Dr.Rehman ask that she talk with her PCP. Prefers that she not take NSAIDS until after the next EGD.

## 2016-05-01 ENCOUNTER — Ambulatory Visit (INDEPENDENT_AMBULATORY_CARE_PROVIDER_SITE_OTHER): Payer: 59 | Admitting: Internal Medicine

## 2016-11-06 ENCOUNTER — Other Ambulatory Visit (HOSPITAL_COMMUNITY): Payer: Self-pay | Admitting: Unknown Physician Specialty

## 2016-11-06 DIAGNOSIS — Z1231 Encounter for screening mammogram for malignant neoplasm of breast: Secondary | ICD-10-CM

## 2016-11-15 ENCOUNTER — Ambulatory Visit (HOSPITAL_COMMUNITY)
Admission: RE | Admit: 2016-11-15 | Discharge: 2016-11-15 | Disposition: A | Discharge status: Home / Self Care | Payer: 59 | Source: Ambulatory Visit | Attending: Unknown Physician Specialty | Admitting: Unknown Physician Specialty

## 2016-11-15 DIAGNOSIS — R928 Other abnormal and inconclusive findings on diagnostic imaging of breast: Secondary | ICD-10-CM | POA: Insufficient documentation

## 2016-11-15 DIAGNOSIS — Z1231 Encounter for screening mammogram for malignant neoplasm of breast: Secondary | ICD-10-CM | POA: Insufficient documentation

## 2016-11-16 ENCOUNTER — Other Ambulatory Visit: Payer: Self-pay | Admitting: Unknown Physician Specialty

## 2016-11-16 DIAGNOSIS — R928 Other abnormal and inconclusive findings on diagnostic imaging of breast: Secondary | ICD-10-CM

## 2016-11-22 ENCOUNTER — Ambulatory Visit
Admission: RE | Admit: 2016-11-22 | Discharge: 2016-11-22 | Disposition: A | Discharge status: Home / Self Care | Payer: 59 | Source: Ambulatory Visit | Attending: Unknown Physician Specialty | Admitting: Unknown Physician Specialty

## 2016-11-22 DIAGNOSIS — R928 Other abnormal and inconclusive findings on diagnostic imaging of breast: Secondary | ICD-10-CM

## 2016-11-27 ENCOUNTER — Encounter (HOSPITAL_COMMUNITY): Payer: 59

## 2017-09-17 DIAGNOSIS — M1811 Unilateral primary osteoarthritis of first carpometacarpal joint, right hand: Secondary | ICD-10-CM | POA: Insufficient documentation

## 2017-11-15 ENCOUNTER — Other Ambulatory Visit (HOSPITAL_COMMUNITY): Payer: Self-pay | Admitting: Family Medicine

## 2017-11-15 ENCOUNTER — Ambulatory Visit (HOSPITAL_COMMUNITY)
Admission: RE | Admit: 2017-11-15 | Discharge: 2017-11-15 | Disposition: A | Discharge status: Home / Self Care | Payer: POS | Source: Ambulatory Visit | Attending: Family Medicine | Admitting: Family Medicine

## 2017-11-15 DIAGNOSIS — J069 Acute upper respiratory infection, unspecified: Secondary | ICD-10-CM

## 2018-01-02 ENCOUNTER — Other Ambulatory Visit (HOSPITAL_COMMUNITY): Payer: Self-pay | Admitting: Unknown Physician Specialty

## 2018-01-02 DIAGNOSIS — Z1231 Encounter for screening mammogram for malignant neoplasm of breast: Secondary | ICD-10-CM

## 2018-01-08 ENCOUNTER — Ambulatory Visit (HOSPITAL_COMMUNITY)
Admission: RE | Admit: 2018-01-08 | Discharge: 2018-01-08 | Disposition: A | Discharge status: Home / Self Care | Payer: POS | Source: Ambulatory Visit | Attending: Unknown Physician Specialty | Admitting: Unknown Physician Specialty

## 2018-01-08 DIAGNOSIS — Z1231 Encounter for screening mammogram for malignant neoplasm of breast: Secondary | ICD-10-CM | POA: Diagnosis not present

## 2018-03-04 ENCOUNTER — Ambulatory Visit (HOSPITAL_COMMUNITY)
Admission: RE | Admit: 2018-03-04 | Discharge: 2018-03-04 | Disposition: A | Discharge status: Home / Self Care | Payer: POS | Source: Ambulatory Visit | Attending: Physician Assistant | Admitting: Physician Assistant

## 2018-03-04 ENCOUNTER — Other Ambulatory Visit (HOSPITAL_COMMUNITY): Payer: Self-pay | Admitting: Physician Assistant

## 2018-03-04 DIAGNOSIS — M25461 Effusion, right knee: Secondary | ICD-10-CM | POA: Diagnosis not present

## 2018-03-04 DIAGNOSIS — M25561 Pain in right knee: Secondary | ICD-10-CM | POA: Insufficient documentation

## 2018-03-04 DIAGNOSIS — Z1389 Encounter for screening for other disorder: Secondary | ICD-10-CM | POA: Insufficient documentation

## 2018-08-11 ENCOUNTER — Ambulatory Visit (INDEPENDENT_AMBULATORY_CARE_PROVIDER_SITE_OTHER): Payer: POS | Admitting: Otolaryngology

## 2018-08-11 ENCOUNTER — Other Ambulatory Visit (INDEPENDENT_AMBULATORY_CARE_PROVIDER_SITE_OTHER): Payer: Self-pay | Admitting: Otolaryngology

## 2018-08-11 DIAGNOSIS — J342 Deviated nasal septum: Secondary | ICD-10-CM | POA: Diagnosis not present

## 2018-08-11 DIAGNOSIS — J310 Chronic rhinitis: Secondary | ICD-10-CM

## 2018-08-11 DIAGNOSIS — Q759 Congenital malformation of skull and face bones, unspecified: Secondary | ICD-10-CM

## 2018-08-11 DIAGNOSIS — J343 Hypertrophy of nasal turbinates: Secondary | ICD-10-CM

## 2018-08-19 ENCOUNTER — Ambulatory Visit (HOSPITAL_COMMUNITY)
Admission: RE | Admit: 2018-08-19 | Discharge: 2018-08-19 | Disposition: A | Discharge status: Home / Self Care | Payer: POS | Source: Ambulatory Visit | Attending: Otolaryngology | Admitting: Otolaryngology

## 2018-08-19 DIAGNOSIS — J3489 Other specified disorders of nose and nasal sinuses: Secondary | ICD-10-CM | POA: Insufficient documentation

## 2018-08-19 DIAGNOSIS — Q759 Congenital malformation of skull and face bones, unspecified: Secondary | ICD-10-CM | POA: Insufficient documentation

## 2018-08-19 DIAGNOSIS — J342 Deviated nasal septum: Secondary | ICD-10-CM | POA: Insufficient documentation

## 2018-09-15 ENCOUNTER — Ambulatory Visit (INDEPENDENT_AMBULATORY_CARE_PROVIDER_SITE_OTHER): Payer: POS | Admitting: Otolaryngology

## 2018-09-15 DIAGNOSIS — J342 Deviated nasal septum: Secondary | ICD-10-CM | POA: Diagnosis not present

## 2018-09-15 DIAGNOSIS — J343 Hypertrophy of nasal turbinates: Secondary | ICD-10-CM | POA: Diagnosis not present

## 2018-09-15 DIAGNOSIS — J310 Chronic rhinitis: Secondary | ICD-10-CM | POA: Diagnosis not present

## 2018-10-02 ENCOUNTER — Ambulatory Visit (INDEPENDENT_AMBULATORY_CARE_PROVIDER_SITE_OTHER): Payer: POS | Admitting: Otolaryngology

## 2018-10-02 DIAGNOSIS — J310 Chronic rhinitis: Secondary | ICD-10-CM | POA: Diagnosis not present

## 2018-10-02 DIAGNOSIS — J342 Deviated nasal septum: Secondary | ICD-10-CM

## 2018-10-02 DIAGNOSIS — J343 Hypertrophy of nasal turbinates: Secondary | ICD-10-CM | POA: Diagnosis not present

## 2018-11-04 ENCOUNTER — Other Ambulatory Visit: Payer: Self-pay | Admitting: Otolaryngology

## 2018-11-06 ENCOUNTER — Other Ambulatory Visit: Payer: Self-pay

## 2018-11-06 ENCOUNTER — Encounter (HOSPITAL_BASED_OUTPATIENT_CLINIC_OR_DEPARTMENT_OTHER): Payer: Self-pay | Admitting: *Deleted

## 2018-11-17 NOTE — Anesthesia Preprocedure Evaluation (Addendum)
Anesthesia Evaluation  Patient identified by MRN, date of birth, ID band Patient awake    Reviewed: Allergy & Precautions, NPO status , Patient's Chart, lab work & pertinent test results  Airway Mallampati: II  TM Distance: >3 FB Neck ROM: Full    Dental  (+) Chipped, Dental Advisory Given, Teeth Intact,    Pulmonary neg pulmonary ROS, Current Smoker,    Pulmonary exam normal breath sounds clear to auscultation       Cardiovascular negative cardio ROS Normal cardiovascular exam Rhythm:Regular Rate:Normal     Neuro/Psych PSYCHIATRIC DISORDERS Anxiety Depression negative neurological ROS     GI/Hepatic negative GI ROS, Neg liver ROS,   Endo/Other  negative endocrine ROS  Renal/GU negative Renal ROS     Musculoskeletal negative musculoskeletal ROS (+)   Abdominal   Peds  Hematology negative hematology ROS (+)   Anesthesia Other Findings   Reproductive/Obstetrics negative OB ROS                           Anesthesia Physical Anesthesia Plan  ASA: II  Anesthesia Plan: General   Post-op Pain Management:    Induction: Intravenous  PONV Risk Score and Plan: 4 or greater and Ondansetron, Dexamethasone, Scopolamine patch - Pre-op and Midazolam  Airway Management Planned: Oral ETT  Additional Equipment: None  Intra-op Plan:   Post-operative Plan: Extubation in OR  Informed Consent: I have reviewed the patients History and Physical, chart, labs and discussed the procedure including the risks, benefits and alternatives for the proposed anesthesia with the patient or authorized representative who has indicated his/her understanding and acceptance.   Dental advisory given  Plan Discussed with: CRNA  Anesthesia Plan Comments:         Anesthesia Quick Evaluation

## 2018-11-18 ENCOUNTER — Encounter (HOSPITAL_BASED_OUTPATIENT_CLINIC_OR_DEPARTMENT_OTHER)
Admission: RE | Disposition: A | Discharge status: Home / Self Care | Payer: Self-pay | Source: Home / Self Care | Attending: Otolaryngology

## 2018-11-18 ENCOUNTER — Encounter (HOSPITAL_BASED_OUTPATIENT_CLINIC_OR_DEPARTMENT_OTHER): Payer: Self-pay | Admitting: Anesthesiology

## 2018-11-18 ENCOUNTER — Ambulatory Visit (HOSPITAL_BASED_OUTPATIENT_CLINIC_OR_DEPARTMENT_OTHER): Payer: Managed Care, Other (non HMO) | Admitting: Anesthesiology

## 2018-11-18 ENCOUNTER — Ambulatory Visit (HOSPITAL_BASED_OUTPATIENT_CLINIC_OR_DEPARTMENT_OTHER)
Admission: RE | Admit: 2018-11-18 | Discharge: 2018-11-18 | Disposition: A | Discharge status: Home / Self Care | Payer: Managed Care, Other (non HMO) | Attending: Otolaryngology | Admitting: Otolaryngology

## 2018-11-18 DIAGNOSIS — F418 Other specified anxiety disorders: Secondary | ICD-10-CM | POA: Diagnosis not present

## 2018-11-18 DIAGNOSIS — J342 Deviated nasal septum: Secondary | ICD-10-CM | POA: Insufficient documentation

## 2018-11-18 DIAGNOSIS — J310 Chronic rhinitis: Secondary | ICD-10-CM | POA: Diagnosis not present

## 2018-11-18 DIAGNOSIS — J343 Hypertrophy of nasal turbinates: Secondary | ICD-10-CM | POA: Diagnosis not present

## 2018-11-18 DIAGNOSIS — Z7951 Long term (current) use of inhaled steroids: Secondary | ICD-10-CM | POA: Diagnosis not present

## 2018-11-18 DIAGNOSIS — Z79899 Other long term (current) drug therapy: Secondary | ICD-10-CM | POA: Diagnosis not present

## 2018-11-18 DIAGNOSIS — F17200 Nicotine dependence, unspecified, uncomplicated: Secondary | ICD-10-CM | POA: Diagnosis not present

## 2018-11-18 HISTORY — PX: NASAL SEPTOPLASTY W/ TURBINOPLASTY: SHX2070

## 2018-11-18 SURGERY — SEPTOPLASTY, NOSE, WITH NASAL TURBINATE REDUCTION
Anesthesia: General | Site: Nose | Laterality: Bilateral

## 2018-11-18 MED ORDER — MEPERIDINE HCL 25 MG/ML IJ SOLN: 6.2500 mg | INTRAMUSCULAR | Status: DC | PRN

## 2018-11-18 MED ORDER — ONDANSETRON HCL 4 MG/2ML IJ SOLN
INTRAMUSCULAR | Status: AC
  Filled 2018-11-18: qty 2

## 2018-11-18 MED ORDER — ROCURONIUM BROMIDE 50 MG/5ML IV SOSY
PREFILLED_SYRINGE | INTRAVENOUS | Status: AC
  Filled 2018-11-18: qty 5

## 2018-11-18 MED ORDER — LIDOCAINE HCL (CARDIAC) PF 100 MG/5ML IV SOSY
PREFILLED_SYRINGE | INTRAVENOUS | Status: DC | PRN
  Administered 2018-11-18: 100 mg via INTRAVENOUS

## 2018-11-18 MED ORDER — ROCURONIUM BROMIDE 100 MG/10ML IV SOLN
INTRAVENOUS | Status: DC | PRN
  Administered 2018-11-18: 30 mg via INTRAVENOUS

## 2018-11-18 MED ORDER — CLINDAMYCIN PHOSPHATE 600 MG/50ML IV SOLN
INTRAVENOUS | Status: AC
  Filled 2018-11-18: qty 50

## 2018-11-18 MED ORDER — OXYCODONE HCL 5 MG/5ML PO SOLN: 5.0000 mg | Freq: Once | ORAL | Status: DC | PRN

## 2018-11-18 MED ORDER — FENTANYL CITRATE (PF) 100 MCG/2ML IJ SOLN
50.0000 ug | INTRAMUSCULAR | Status: DC | PRN
  Administered 2018-11-18: 50 ug via INTRAVENOUS

## 2018-11-18 MED ORDER — PROPOFOL 500 MG/50ML IV EMUL
INTRAVENOUS | Status: AC
  Filled 2018-11-18: qty 50

## 2018-11-18 MED ORDER — PROMETHAZINE HCL 25 MG/ML IJ SOLN: 6.2500 mg | INTRAMUSCULAR | Status: DC | PRN

## 2018-11-18 MED ORDER — DEXAMETHASONE SODIUM PHOSPHATE 10 MG/ML IJ SOLN
INTRAMUSCULAR | Status: AC
  Filled 2018-11-18: qty 1

## 2018-11-18 MED ORDER — OXYMETAZOLINE HCL 0.05 % NA SOLN
NASAL | Status: AC
  Filled 2018-11-18: qty 15

## 2018-11-18 MED ORDER — FENTANYL CITRATE (PF) 100 MCG/2ML IJ SOLN
INTRAMUSCULAR | Status: AC
  Filled 2018-11-18: qty 2

## 2018-11-18 MED ORDER — LACTATED RINGERS IV SOLN
INTRAVENOUS | Status: DC
  Administered 2018-11-18 (×2): via INTRAVENOUS

## 2018-11-18 MED ORDER — MIDAZOLAM HCL 2 MG/2ML IJ SOLN
1.0000 mg | INTRAMUSCULAR | Status: DC | PRN
  Administered 2018-11-18: 2 mg via INTRAVENOUS

## 2018-11-18 MED ORDER — ARTIFICIAL TEARS OPHTHALMIC OINT
TOPICAL_OINTMENT | OPHTHALMIC | Status: AC
  Filled 2018-11-18: qty 3.5

## 2018-11-18 MED ORDER — PROPOFOL 10 MG/ML IV BOLUS
INTRAVENOUS | Status: DC | PRN
  Administered 2018-11-18: 150 mg via INTRAVENOUS

## 2018-11-18 MED ORDER — LIDOCAINE 2% (20 MG/ML) 5 ML SYRINGE
INTRAMUSCULAR | Status: AC
  Filled 2018-11-18: qty 5

## 2018-11-18 MED ORDER — LIDOCAINE-EPINEPHRINE 1 %-1:100000 IJ SOLN
INTRAMUSCULAR | Status: AC
  Filled 2018-11-18: qty 1

## 2018-11-18 MED ORDER — SUGAMMADEX SODIUM 200 MG/2ML IV SOLN
INTRAVENOUS | Status: AC
  Filled 2018-11-18: qty 2

## 2018-11-18 MED ORDER — CLINDAMYCIN PHOSPHATE 600 MG/50ML IV SOLN
INTRAVENOUS | Status: DC | PRN
  Administered 2018-11-18: 600 mg via INTRAVENOUS

## 2018-11-18 MED ORDER — MIDAZOLAM HCL 2 MG/2ML IJ SOLN
INTRAMUSCULAR | Status: AC
  Filled 2018-11-18: qty 2

## 2018-11-18 MED ORDER — CLINDAMYCIN HCL 300 MG PO CAPS: 300.0000 mg | ORAL_CAPSULE | Freq: Three times a day (TID) | ORAL | 0 refills | Status: AC

## 2018-11-18 MED ORDER — SUGAMMADEX SODIUM 200 MG/2ML IV SOLN
INTRAVENOUS | Status: DC | PRN
  Administered 2018-11-18: 200 mg via INTRAVENOUS

## 2018-11-18 MED ORDER — MUPIROCIN 2 % EX OINT
TOPICAL_OINTMENT | CUTANEOUS | Status: DC | PRN
  Administered 2018-11-18: 1 via NASAL

## 2018-11-18 MED ORDER — ONDANSETRON HCL 4 MG/2ML IJ SOLN
INTRAMUSCULAR | Status: DC | PRN
  Administered 2018-11-18: 4 mg via INTRAVENOUS

## 2018-11-18 MED ORDER — OXYCODONE-ACETAMINOPHEN 5-325 MG PO TABS: 1.0000 | ORAL_TABLET | ORAL | 0 refills | Status: DC | PRN

## 2018-11-18 MED ORDER — DEXAMETHASONE SODIUM PHOSPHATE 4 MG/ML IJ SOLN
INTRAMUSCULAR | Status: DC | PRN
  Administered 2018-11-18: 10 mg via INTRAVENOUS

## 2018-11-18 MED ORDER — OXYMETAZOLINE HCL 0.05 % NA SOLN
NASAL | Status: DC | PRN
  Administered 2018-11-18: 1 via TOPICAL

## 2018-11-18 MED ORDER — SCOPOLAMINE 1 MG/3DAYS TD PT72
1.0000 | MEDICATED_PATCH | Freq: Once | TRANSDERMAL | Status: AC | PRN
  Administered 2018-11-18: 1 via TRANSDERMAL

## 2018-11-18 MED ORDER — LIDOCAINE-EPINEPHRINE 1 %-1:100000 IJ SOLN
INTRAMUSCULAR | Status: DC | PRN
  Administered 2018-11-18: 4 mL

## 2018-11-18 MED ORDER — PHENYLEPHRINE HCL 10 MG/ML IJ SOLN
INTRAMUSCULAR | Status: AC
  Filled 2018-11-18: qty 1

## 2018-11-18 MED ORDER — SCOPOLAMINE 1 MG/3DAYS TD PT72
MEDICATED_PATCH | TRANSDERMAL | Status: AC
  Filled 2018-11-18: qty 1

## 2018-11-18 MED ORDER — PHENYLEPHRINE 40 MCG/ML (10ML) SYRINGE FOR IV PUSH (FOR BLOOD PRESSURE SUPPORT)
PREFILLED_SYRINGE | INTRAVENOUS | Status: DC | PRN
  Administered 2018-11-18 (×2): 80 ug via INTRAVENOUS

## 2018-11-18 MED ORDER — OXYCODONE HCL 5 MG PO TABS: 5.0000 mg | ORAL_TABLET | Freq: Once | ORAL | Status: DC | PRN

## 2018-11-18 MED ORDER — FENTANYL CITRATE (PF) 100 MCG/2ML IJ SOLN: 25.0000 ug | INTRAMUSCULAR | Status: DC | PRN

## 2018-11-18 MED ORDER — MUPIROCIN 2 % EX OINT
TOPICAL_OINTMENT | CUTANEOUS | Status: AC
  Filled 2018-11-18: qty 22

## 2018-11-18 SURGICAL SUPPLY — 35 items
ATTRACTOMAT 16X20 MAGNETIC DRP (DRAPES) IMPLANT
CANISTER SUCT 1200ML W/VALVE (MISCELLANEOUS) ×2 IMPLANT
COAGULATOR SUCT 8FR VV (MISCELLANEOUS) ×2 IMPLANT
COVER WAND RF STERILE (DRAPES) IMPLANT
DECANTER SPIKE VIAL GLASS SM (MISCELLANEOUS) IMPLANT
DRSG NASOPORE 8CM (GAUZE/BANDAGES/DRESSINGS) IMPLANT
DRSG TELFA 3X8 NADH (GAUZE/BANDAGES/DRESSINGS) IMPLANT
ELECT REM PT RETURN 9FT ADLT (ELECTROSURGICAL) ×2
ELECTRODE REM PT RTRN 9FT ADLT (ELECTROSURGICAL) ×1 IMPLANT
GLOVE BIO SURGEON STRL SZ 6.5 (GLOVE) ×1 IMPLANT
GLOVE BIO SURGEON STRL SZ7.5 (GLOVE) ×2 IMPLANT
GLOVE BIOGEL PI IND STRL 7.0 (GLOVE) IMPLANT
GLOVE BIOGEL PI INDICATOR 7.0 (GLOVE) ×1
GOWN STRL REUS W/ TWL LRG LVL3 (GOWN DISPOSABLE) ×2 IMPLANT
GOWN STRL REUS W/TWL LRG LVL3 (GOWN DISPOSABLE) ×4
NDL HYPO 25X1 1.5 SAFETY (NEEDLE) ×1 IMPLANT
NEEDLE HYPO 25X1 1.5 SAFETY (NEEDLE) ×2 IMPLANT
NS IRRIG 1000ML POUR BTL (IV SOLUTION) ×2 IMPLANT
PACK BASIN DAY SURGERY FS (CUSTOM PROCEDURE TRAY) ×2 IMPLANT
PACK ENT DAY SURGERY (CUSTOM PROCEDURE TRAY) ×2 IMPLANT
PAD DRESSING TELFA 3X8 NADH (GAUZE/BANDAGES/DRESSINGS) IMPLANT
SLEEVE SCD COMPRESS KNEE MED (MISCELLANEOUS) IMPLANT
SOLUTION BUTLER CLEAR DIP (MISCELLANEOUS) ×2 IMPLANT
SPLINT NASAL AIRWAY SILICONE (MISCELLANEOUS) IMPLANT
SPONGE GAUZE 2X2 8PLY STRL LF (GAUZE/BANDAGES/DRESSINGS) ×2 IMPLANT
SPONGE NEURO XRAY DETECT 1X3 (DISPOSABLE) ×2 IMPLANT
SUT CHROMIC 4 0 P 3 18 (SUTURE) ×2 IMPLANT
SUT PLAIN 4 0 ~~LOC~~ 1 (SUTURE) ×2 IMPLANT
SUT PROLENE 3 0 PS 2 (SUTURE) ×2 IMPLANT
SUT VIC AB 4-0 P-3 18XBRD (SUTURE) IMPLANT
SUT VIC AB 4-0 P3 18 (SUTURE)
TOWEL GREEN STERILE FF (TOWEL DISPOSABLE) ×3 IMPLANT
TUBE SALEM SUMP 12R W/ARV (TUBING) IMPLANT
TUBE SALEM SUMP 16 FR W/ARV (TUBING) ×2 IMPLANT
YANKAUER SUCT BULB TIP NO VENT (SUCTIONS) ×2 IMPLANT

## 2018-11-18 NOTE — Anesthesia Procedure Notes (Signed)
Procedure Name: Intubation Date/Time: 11/18/2018 7:37 AM Performed by: Lyndee Leo, CRNA Pre-anesthesia Checklist: Patient identified, Emergency Drugs available, Suction available and Patient being monitored Patient Re-evaluated:Patient Re-evaluated prior to induction Oxygen Delivery Method: Circle system utilized Preoxygenation: Pre-oxygenation with 100% oxygen Induction Type: IV induction Ventilation: Mask ventilation without difficulty Laryngoscope Size: Mac and 3 Grade View: Grade I Tube type: Oral Rae Tube size: 7.0 mm Number of attempts: 1 Airway Equipment and Method: Stylet and Oral airway Placement Confirmation: ETT inserted through vocal cords under direct vision,  positive ETCO2 and breath sounds checked- equal and bilateral Secured at: 20 cm Tube secured with: Tape Dental Injury: Teeth and Oropharynx as per pre-operative assessment

## 2018-11-18 NOTE — Discharge Instructions (Addendum)

## 2018-11-18 NOTE — H&P (Signed)
Cc: Chronic nasal obstruction  HPI: The patient is a 57 year old female who returns today with questions regarding surgery and new complaint of left ear discomfort. The patient was last seen 2 weeks ago to review her sinus CT. The CT showed no acute or chronic sinus diseases. However, she has significant leftward nasal septal deviation and septal spur. The option of septoplasty and bilateral inferior turbinate reduction was discussed. The patient returns today noting persistent nasal congestion. This is causing dry mouth and loud snoring. The patient is also concerned that something is wrong with her left ear. She feels like there is a mass behind her ear. No otorrhea or hearing loss is noted. No other ENT, GI, or respiratory issue noted since the last visit.   Exam: General: Communicates without difficulty, well nourished, no acute distress. Head: Normocephalic, no evidence injury, no tenderness, facial buttresses intact without stepoff. Eyes: PERRL, EOMI. No scleral icterus, conjunctivae clear. Neuro: CN II exam reveals vision grossly intact. No nystagmus at any point of gaze. Ears: Auricles well formed without lesions. Ear canals are intact without mass or lesion. No erythema or edema is appreciated. The TMs are intact without fluid. Nose: External evaluation reveals normal support and skin without lesions. Dorsum is intact. Anterior rhinoscopy reveals congested and edematous mucosa over anterior aspect of the inferior turbinates and deviated nasal septum. No purulence is noted. Oral:  Oral cavity and oropharynx are intact, symmetric, without erythema or edema. Mucosa is moist without lesions. Neck: Full range of motion without pain. There is no significant lymphadenopathy. No masses palpable. Thyroid bed within normal limits to palpation. Parotid glands and submandibular glands equal bilaterally without mass. Trachea is midline. Neuro:  CN 2-12 grossly intact. Gait normal. Vestibular: No nystagmus at any  point of gaze.   Assessment  1.  Chronic rhinitis, with nasal septal deviation and bilateral inferior turbinate hypertrophy.   2.  No acute or chronic sinusitis is noted on sinus CT scan.  3.  The patient has a normal otologic evaluation today.   Plan: 1.  The physical exam findings are reviewed with the patient. The patient is reassured that her otologic exam is normal. 2.  The patient should continue with her Flonase nasal spray and Singulair.  3.  Nasal saline irrigation prn nasal symptoms.  4.  The option of septoplasty/turbinate reduction to improve her nasal passageways is discussed.  The risks, benefits, and details of the procedure are reviewed with the patient.  5.  The patient would like to proceed with the procedure.

## 2018-11-18 NOTE — Anesthesia Postprocedure Evaluation (Signed)
Anesthesia Post Note  Patient: Christina Esparza  Procedure(s) Performed: NASAL SEPTOPLASTY WITH BILATERAL TURBINATE REDUCTION (Bilateral Nose)     Patient location during evaluation: PACU Anesthesia Type: General Level of consciousness: sedated and patient cooperative Pain management: pain level controlled Vital Signs Assessment: post-procedure vital signs reviewed and stable Respiratory status: spontaneous breathing Cardiovascular status: stable Anesthetic complications: no    Last Vitals:  Vitals:   11/18/18 0915 11/18/18 0930  BP: 132/79 131/81  Pulse: 73 73  Resp: 19 16  Temp:  36.7 C  SpO2: 92% 96%    Last Pain:  Vitals:   11/18/18 0930  TempSrc:   PainSc: [place]

## 2018-11-18 NOTE — Transfer of Care (Signed)
Immediate Anesthesia Transfer of Care Note  Patient: Christina Esparza  Procedure(s) Performed: NASAL SEPTOPLASTY WITH BILATERAL TURBINATE REDUCTION (Bilateral Nose)  Patient Location: PACU  Anesthesia Type:General  Level of Consciousness: awake, oriented and sedated  Airway & Oxygen Therapy: Patient Spontanous Breathing and Patient connected to face mask oxygen  Post-op Assessment: Report given to RN and Post -op Vital signs reviewed and stable  Post vital signs: Reviewed and stable  Last Vitals:  Vitals Value Taken Time  BP    Temp    Pulse    Resp    SpO2      Last Pain:  Vitals:   11/18/18 0653  TempSrc: Oral  PainSc: 0-No pain         Complications: No apparent anesthesia complications

## 2018-11-18 NOTE — Op Note (Signed)
DATE OF PROCEDURE: 11/18/2018  OPERATIVE REPORT   SURGEON: Leta Baptist, MD   PREOPERATIVE DIAGNOSES:  1. Severe nasal septal deviation.  2. Bilateral inferior turbinate hypertrophy.  3. Chronic nasal obstruction.  POSTOPERATIVE DIAGNOSES:  1. Severe nasal septal deviation.  2. Bilateral inferior turbinate hypertrophy.  3. Chronic nasal obstruction.  PROCEDURE PERFORMED:  1. Septoplasty.  2. Bilateral partial inferior turbinate resection.   ANESTHESIA: General endotracheal tube anesthesia.   COMPLICATIONS: None.   ESTIMATED BLOOD LOSS: 150 mL.   INDICATION FOR PROCEDURE: Christina Esparza is a 57 y.o. female with a history of chronic nasal obstruction. The patient was  treated with antihistamine, decongestant, and steroid nasal spray. However, the patient continued to be symptomatic. On examination, the patient was noted to have bilateral severe inferior turbinate hypertrophy and significant nasal septal deviation, causing significant nasal obstruction. Based on the above findings, the decision was made for the patient to undergo the above-stated procedures. The risks, benefits, alternatives, and details of the procedures were discussed with the patient. Questions were invited and answered. Informed consent was obtained.   DESCRIPTION OF PROCEDURE: The patient was taken to the operating room and placed supine on the operating table. General endotracheal tube anesthesia was administered by the anesthesiologist. The patient was positioned, and prepped and draped in the standard fashion for nasal surgery. Pledgets soaked with Afrin were placed in both nasal cavities for decongestion. The pledgets were subsequently removed. The above mentioned severe septal deviation was again noted. 1% lidocaine with 1:100,000 epinephrine was injected onto the nasal septum bilaterally. A hemitransfixion incision was made on the left side. The mucosal flap was carefully elevated on the left side. A cartilaginous  incision was made 1 cm superior to the caudal margin of the nasal septum. Mucosal flap was also elevated on the right side in the similar fashion. It should be noted that due to the severe septal deviation, the deviated portion of the cartilaginous and bony septum had to be removed in piecemeal fashion. Once the deviated portions were removed, a straight midline septum was achieved. The septum was then quilted with 4-0 plain gut sutures. The hemitransfixion incision was closed with interrupted 4-0 chromic sutures. Doyle splints were applied.   Prior to the [facility] splint application, the inferior one half of both hypertrophied inferior turbinate was crossclamped with a Kelly clamp. The inferior one half of each inferior turbinate was then resected with a pair of cross cutting scissors. Hemostasis was achieved with a suction cautery device.   The care of the patient was turned over to the anesthesiologist. The patient was awakened from anesthesia without difficulty. The patient was extubated and transferred to the recovery room in good condition.   OPERATIVE FINDINGS: Severe nasal septal deviation and bilateral inferior turbinate hypertrophy.   SPECIMEN: None.   FOLLOWUP CARE: The patient be discharged home once she is awake and alert. The patient will be placed on Percocet 1 tablets p.o. q.4 hours p.r.n. pain, and clindamycin TID for 3 days. The patient will follow up in my office in 2 days for splint removal.   Hermen Mario Raynelle Bring, MD

## 2018-11-20 ENCOUNTER — Ambulatory Visit (INDEPENDENT_AMBULATORY_CARE_PROVIDER_SITE_OTHER): Payer: Managed Care, Other (non HMO) | Admitting: Otolaryngology

## 2018-11-20 ENCOUNTER — Encounter (HOSPITAL_BASED_OUTPATIENT_CLINIC_OR_DEPARTMENT_OTHER): Payer: Self-pay | Admitting: Otolaryngology

## 2018-12-04 ENCOUNTER — Ambulatory Visit (INDEPENDENT_AMBULATORY_CARE_PROVIDER_SITE_OTHER): Payer: Managed Care, Other (non HMO) | Admitting: Otolaryngology

## 2019-02-17 IMAGING — CT CT MAXILLOFACIAL W/O CM
3 series · 14 of 47 positions shown, 16 images · non-contrast
Comparison: None.

CLINICAL DATA: "Fusion, skull, imperfect."

Maxillary sinus pain and pressure with nasal sores for 1 year
EXAM:
CT MAXILLOFACIAL WITHOUT CONTRAST
TECHNIQUE: Multidetector CT images of the paranasal sinuses were obtained using
the standard protocol without intravenous contrast.

[Series 2: standard · axial · 0.33mm/px · z∈[+1321,+1420]mm · 8 of 115 slices shown, 10 images]
[im 8/115  brain]
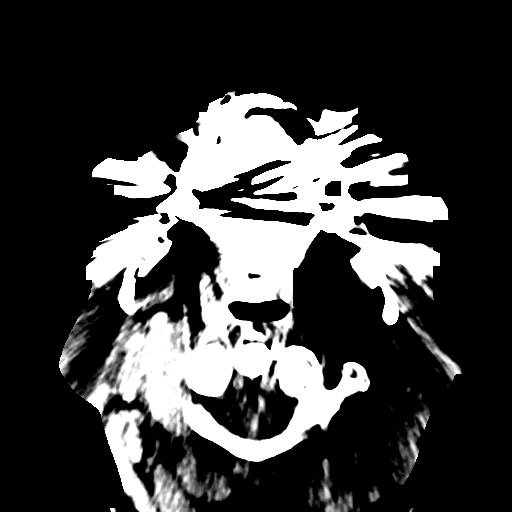
[im 8/115  bone]
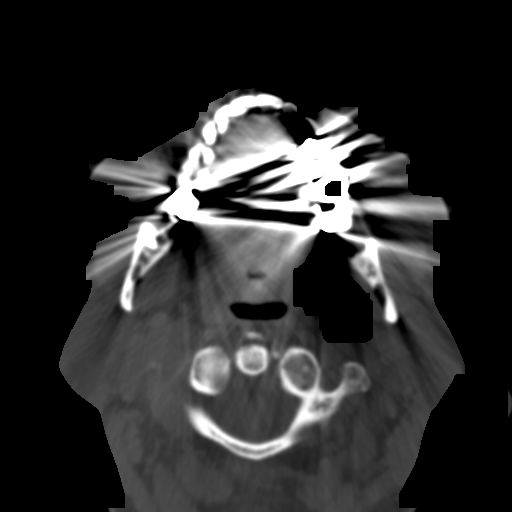
[im 24/115  bone]
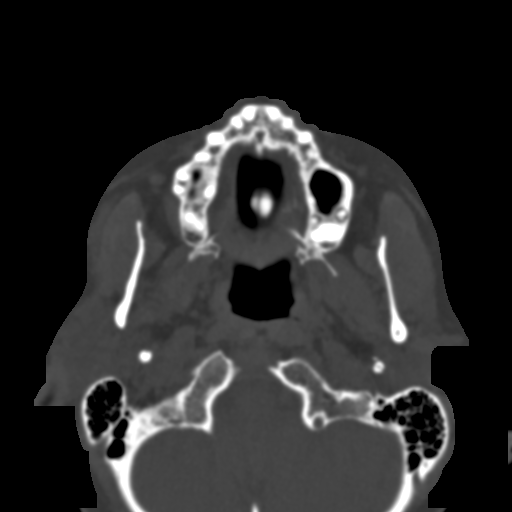
[im 36/115  bone]
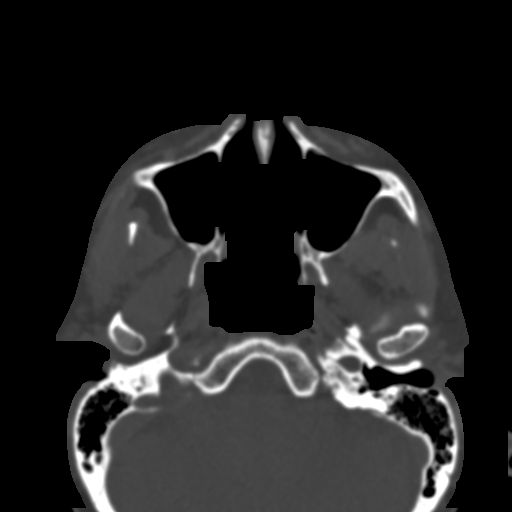
[im 52/115  bone]
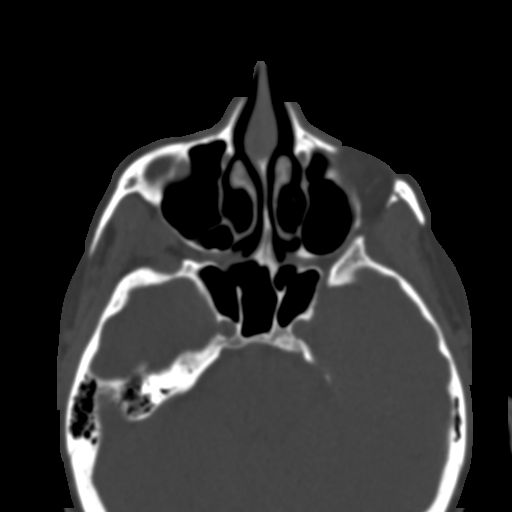
[im 63/115  brain]
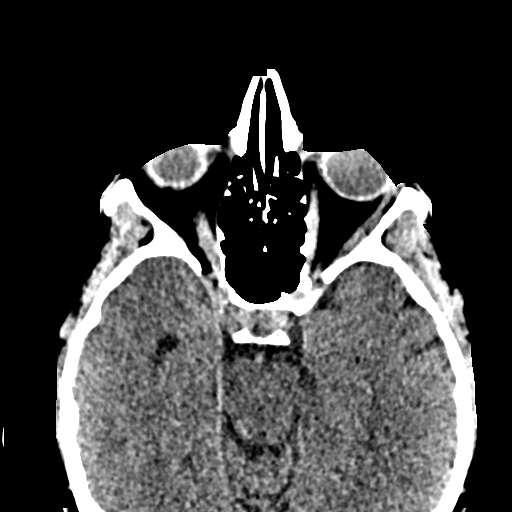
[im 63/115  bone]
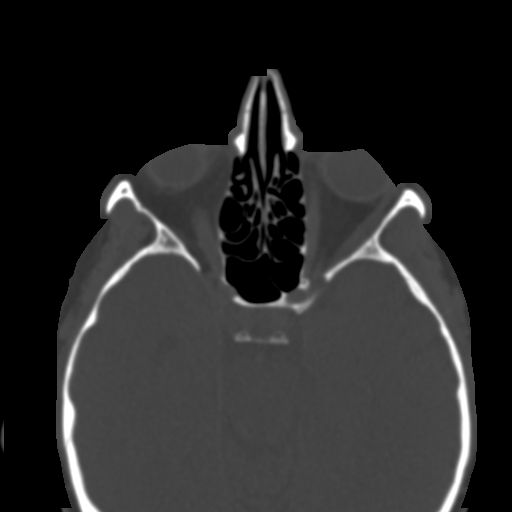
[im 79/115  bone]
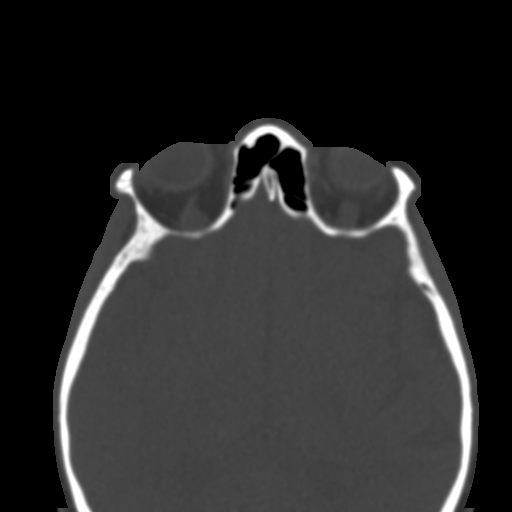
[im 91/115  bone]
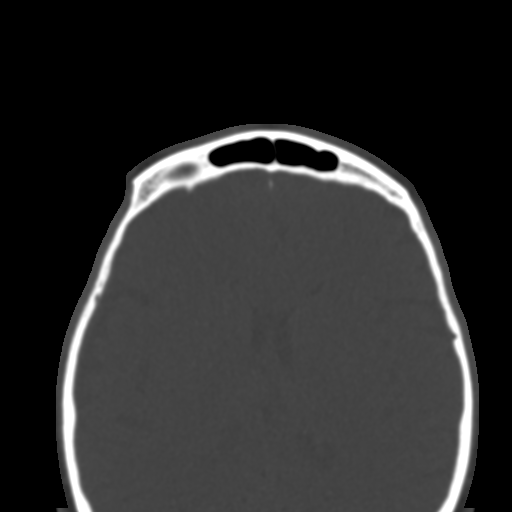
[im 107/115  bone]
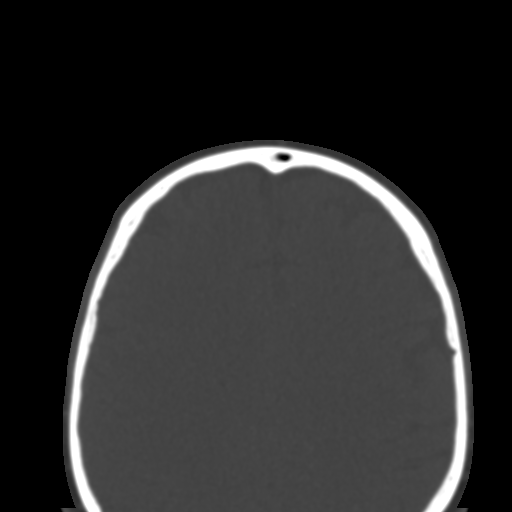

[Series 4: coronal · coronal · 0.22mm/px · 3 of 81 slices shown]
[im 36/81  bone]
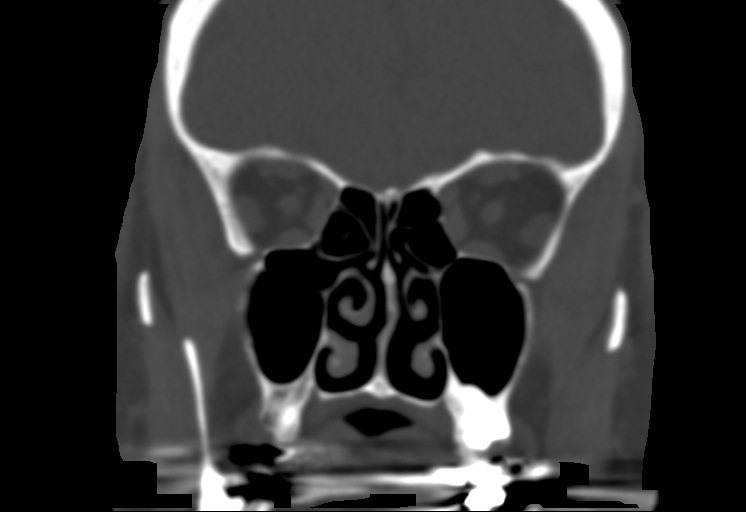
[im 45/81  bone]
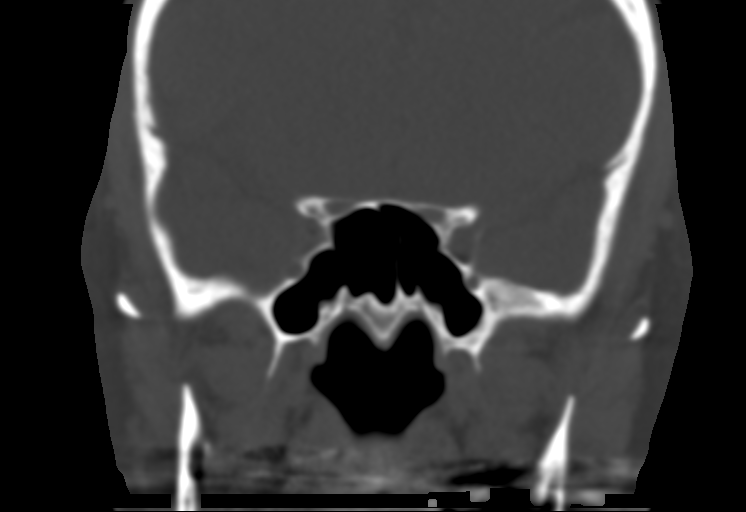
[im 54/81  bone]
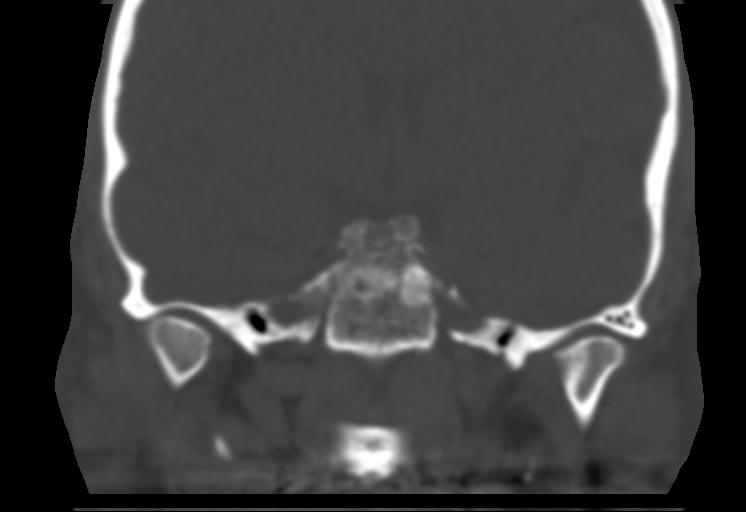

[Series 5: sagittal · sagittal · 0.22mm/px · 3 of 84 slices shown]
[im 28/84  bone]
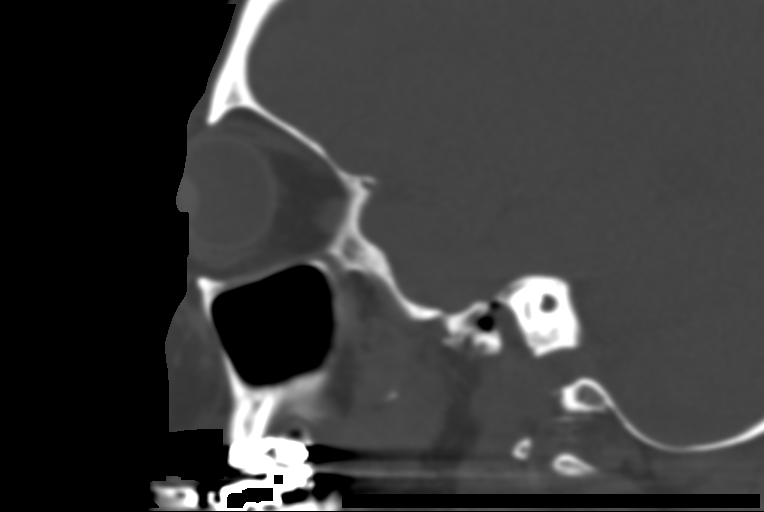
[im 42/84  bone]
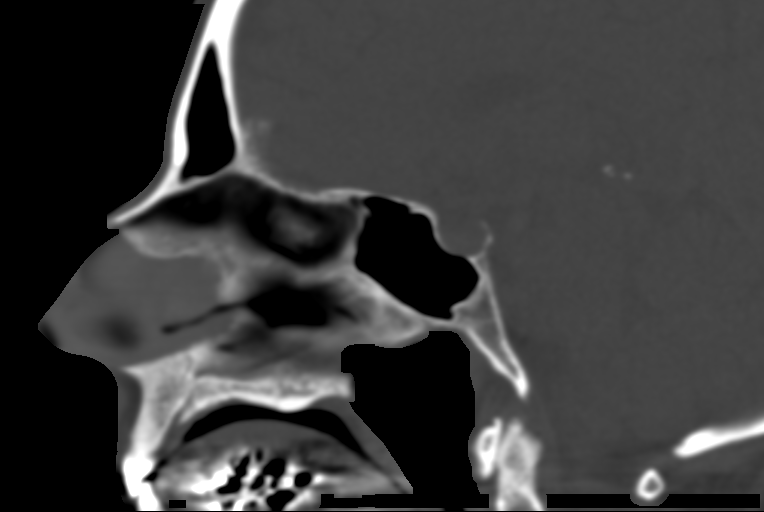
[im 56/84  bone]
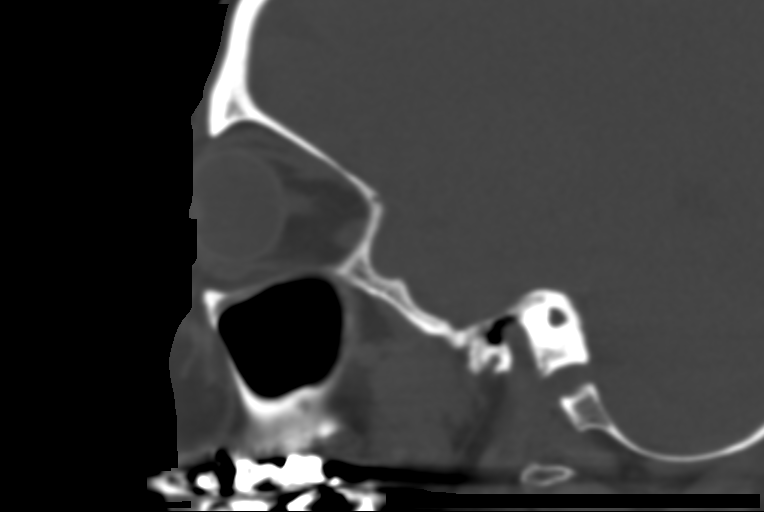

[14 of 47 positions shown; findings below may reference images not displayed]

FINDINGS: Paranasal sinuses:

Frontal: Normally aerated. Patent frontal sinus drainage pathways.

Ethmoid: Normally aerated.

Maxillary: Normally aerated.

Sphenoid: Normally aerated. Patent sphenoethmoidal recesses.

Right ostiomeatal unit: Patent.

Left ostiomeatal unit: Patent.

Nasal passages: Patent. Nasal septum is deviated to the left with
spur contacting the middle turbinate.

Anatomy: No pneumatization superior to anterior ethmoid notches.
Sellar sphenoid pneumatization pattern. No dehiscence of carotid or
optic canals. No onodi cell.

Other: Orbits and intracranial compartment are unremarkable. Visible
mastoid air cells are normally aerated. Small sclerotic focus in the
left upper clivus, benign and stable.

No bone windows, local protocol.
IMPRESSION: 1. Normally aerated paranasal sinuses. Patent sinus drainage
pathways.
2. Leftward nasal septal deviation and spurring.

## 2019-04-01 ENCOUNTER — Other Ambulatory Visit: Payer: Self-pay

## 2019-04-01 ENCOUNTER — Other Ambulatory Visit (HOSPITAL_COMMUNITY): Payer: Self-pay | Admitting: Internal Medicine

## 2019-04-01 ENCOUNTER — Ambulatory Visit (HOSPITAL_COMMUNITY)
Admission: RE | Admit: 2019-04-01 | Discharge: 2019-04-01 | Disposition: A | Discharge status: Home / Self Care | Payer: 59 | Source: Ambulatory Visit | Attending: Internal Medicine | Admitting: Internal Medicine

## 2019-04-01 DIAGNOSIS — R0600 Dyspnea, unspecified: Secondary | ICD-10-CM

## 2019-04-09 ENCOUNTER — Other Ambulatory Visit: Payer: Self-pay

## 2019-04-09 ENCOUNTER — Ambulatory Visit (INDEPENDENT_AMBULATORY_CARE_PROVIDER_SITE_OTHER): Payer: 59 | Admitting: Otolaryngology

## 2019-04-09 DIAGNOSIS — J310 Chronic rhinitis: Secondary | ICD-10-CM | POA: Diagnosis not present

## 2019-04-09 DIAGNOSIS — K219 Gastro-esophageal reflux disease without esophagitis: Secondary | ICD-10-CM | POA: Diagnosis not present

## 2019-04-20 ENCOUNTER — Other Ambulatory Visit (HOSPITAL_COMMUNITY): Payer: Self-pay | Admitting: Unknown Physician Specialty

## 2019-04-20 DIAGNOSIS — Z1231 Encounter for screening mammogram for malignant neoplasm of breast: Secondary | ICD-10-CM

## 2019-05-06 ENCOUNTER — Ambulatory Visit (HOSPITAL_COMMUNITY)
Admission: RE | Admit: 2019-05-06 | Discharge: 2019-05-06 | Disposition: A | Discharge status: Home / Self Care | Payer: 59 | Source: Ambulatory Visit | Attending: Unknown Physician Specialty | Admitting: Unknown Physician Specialty

## 2019-05-06 ENCOUNTER — Other Ambulatory Visit: Payer: Self-pay

## 2019-05-06 DIAGNOSIS — Z1231 Encounter for screening mammogram for malignant neoplasm of breast: Secondary | ICD-10-CM | POA: Insufficient documentation

## 2019-10-20 ENCOUNTER — Ambulatory Visit
Admission: EM | Admit: 2019-10-20 | Discharge: 2019-10-20 | Disposition: A | Discharge status: Home / Self Care | Payer: 59

## 2019-10-20 ENCOUNTER — Other Ambulatory Visit: Payer: Self-pay

## 2019-10-20 DIAGNOSIS — K219 Gastro-esophageal reflux disease without esophagitis: Secondary | ICD-10-CM | POA: Diagnosis not present

## 2019-10-20 DIAGNOSIS — R0602 Shortness of breath: Secondary | ICD-10-CM

## 2019-10-20 NOTE — Discharge Instructions (Signed)
Continue with acid reflux medication as directed Trial prednisone as prescribed Avoid eating 2-3 hours before bed Elevate head bed.  Avoid chocolate, caffeine, alcohol, onion, and mint prior to bed.  This relaxes the bottom part of your esophagus and can make your symptoms worse.  Follow up with GI specialist for further evaluation and managment Return or go to the ED if you have any new or worsening symptoms fever, chills, nausea, vomiting, abdominal pain, changes in bowel or bladder function, chest pain, worsening shortness of breath, etc..Marland Kitchen

## 2019-10-20 NOTE — ED Triage Notes (Signed)
Pt presents with c/o of shortness of breath that she has had for a while, pt had septoplasty last year and has had recurrent episodes of sob , pt states she feels like she cant get a deep breath

## 2019-10-20 NOTE — ED Provider Notes (Signed)
[place]   [id number] 10/20/19 Arrival Time: [id number]  CC: SOB and esophageal spasm  SUBJECTIVE:  Christina Esparza is a 58 y.o. female who presents with complaint of subjective SOB, and "esophageal spasm" x couple of months with recent exacerbation over the last few weeks.  Denies a precipitating event, trauma.  Patient's hx is significant for acid reflux.  Patient also states she has been under a "tremendous" amount of stress recently.  Localizes discomfort to esophagus  Describes as worsening, intermittent and spasm in character.  Was seen in the ED on 09/13/2019 for similar symptoms and had blood work, EKG, and CXR done.  All were normal per patient.  ED had recommended follow up with PCP for pulmonary function test, concern for COPD with smoking hx.  PFTs were within normal limits, and patient received a steroid shot with temporary relief of symptoms with PCP.  Patient then scheduled an appt with GI, but unable to be seen until next month.  Has been taking acid reflux medication without relief.  PCP called in RX of prednisone, has not started taking.  Denies aggravating factors.    Of note, patient was also seen by ENT and had a limited scope "just to the throat," which was normal other than some acid reflux "pooling" in the back of her throat.  Also states symptoms worsened symptoms after having scope.    Denies fever, chills, rhinorrhea, congestion, sore throat, nausea, vomiting, chest pain, diarrhea, constipation, hematochezia, melena, dysuria, difficulty urinating, increased frequency or urgency.    No LMP recorded. Patient is postmenopausal.  ROS: As per HPI.  All other pertinent ROS negative.     Past Medical History:  Diagnosis Date  . Anxiety   . Depression    Past Surgical History:  Procedure Laterality Date  . COLONOSCOPY N/A 06/17/2013   Procedure: COLONOSCOPY;  Surgeon: Rogene Houston, MD;  [facility];  Service: Endoscopy;  Laterality: N/A;  730  .  ESOPHAGEAL DILATION N/A 11/15/2015   Procedure: ESOPHAGEAL DILATION;  Surgeon: Rogene Houston, MD;  [facility];  Service: Endoscopy;  Laterality: N/A;  . ESOPHAGOGASTRODUODENOSCOPY N/A 11/15/2015   Procedure: ESOPHAGOGASTRODUODENOSCOPY (EGD);  Surgeon: Rogene Houston, MD;  [facility];  Service: Endoscopy;  Laterality: N/A;  . NASAL SEPTOPLASTY W/ TURBINOPLASTY Bilateral 11/18/2018   Procedure: NASAL SEPTOPLASTY WITH BILATERAL TURBINATE REDUCTION;  Surgeon: Leta Baptist, MD;  Location: [facility];  Service: ENT;  Laterality: Bilateral;  . TUBAL LIGATION     Allergies  Allergen Reactions  . Amitiza [Lubiprostone] Itching    Patient states that she experienced a pin sticking sensation, prickling all over.  . Linaclotide Nausea Only  . Ceftin [Cefuroxime] Itching  . Codeine Nausea And Vomiting  . Levaquin [Levofloxacin In D5w]     dream  . Penicillins     Felt like she was choking   No current facility-administered medications on file prior to encounter.    Current Outpatient Medications on File Prior to Encounter  Medication Sig Dispense Refill  . ALPRAZolam (XANAX) 0.5 MG tablet Take 0.5 mg by mouth every 6 (six) hours as needed for anxiety.    . cetirizine (ZYRTEC) 10 MG tablet Take 10 mg by mouth daily.    . fexofenadine (ALLEGRA) 180 MG tablet Take 180 mg by mouth daily.    Marland Kitchen omeprazole (PRILOSEC) 20 MG capsule Take 1 capsule (20 mg total) by mouth 2 (two) times daily before a meal. 60  capsule 5  . sertraline (ZOLOFT) 100 MG tablet Take 150 mg by mouth daily.    . SUMAtriptan (IMITREX) 50 MG tablet TK 1 T PO D PRF MIGRAINE  0   Social History   Socioeconomic History  . Marital status: Married    Spouse name: Not on file  . Number of children: Not on file  . Years of education: Not on file  . Highest education level: Not on file  Occupational History  . Not on file  Social Needs  . Financial resource strain: Not on file  . Food  insecurity    Worry: Not on file    Inability: Not on file  . Transportation needs    Medical: Not on file    Non-medical: Not on file  Tobacco Use  . Smoking status: Current Every Day Smoker    Packs/day: 1.00    Types: Cigarettes  . Smokeless tobacco: Never Used  . Tobacco comment: over 20 yrs.   Substance and Sexual Activity  . Alcohol use: No    Alcohol/week: 0.0 standard drinks  . Drug use: No  . Sexual activity: Not on file  Lifestyle  . Physical activity    Days per week: Not on file    Minutes per session: Not on file  . Stress: Not on file  Relationships  . Social Herbalist on phone: Not on file    Gets together: Not on file    Attends religious service: Not on file    Active member of club or organization: Not on file    Attends meetings of clubs or organizations: Not on file    Relationship status: Not on file  . Intimate partner violence    Fear of current or ex partner: Not on file    Emotionally abused: Not on file    Physically abused: Not on file    Forced sexual activity: Not on file  Other Topics Concern  . Not on file  Social History Narrative  . Not on file   Family History  Problem Relation Age of Onset  . Colon polyps Mother   . Colon cancer Neg Hx      OBJECTIVE:  Vitals:   10/20/19 1448  BP: 125/78  Pulse: (!) 102  Resp: 20  Temp: 98 F (36.7 C)  SpO2: 98%    General appearance: Alert; NAD HEENT: NCAT.  PERRL, EOMI grossly; Oropharynx clear.  Lungs: clear to auscultation bilaterally without adventitious breath sounds Heart: regular rate and rhythm.  Radial pulses 2+ symmetrical bilaterally Abdomen: soft, non-distended; normal active bowel sounds; non-tender to light and deep palpation; nontender at McBurney's point; negative Murphy's sign; no guarding Extremities: no edema; symmetrical with no gross deformities Skin: warm and dry Neurologic: normal gait Psychological: alert and cooperative; normal mood and affect   ASSESSMENT & PLAN:  1. Gastroesophageal reflux disease, unspecified whether esophagitis present   2. Shortness of breath    Continue with acid reflux medication as directed Trial prednisone as prescribed Avoid eating 2-3 hours before bed Elevate head bed.  Avoid chocolate, caffeine, alcohol, onion, and mint prior to bed.  This relaxes the bottom part of your esophagus and can make your symptoms worse.  Follow up with GI specialist for further evaluation and managment Return or go to the ED if you have any new or worsening symptoms fever, chills, nausea, vomiting, abdominal pain, changes in bowel or bladder function, chest pain, worsening shortness of breath, etc..Marland Kitchen  Reviewed expectations re: course of current medical issues. Questions answered. Outlined signs and symptoms indicating need for more acute intervention. Patient verbalized understanding. After Visit Summary given.   Lestine Box, PA-C 10/20/19 1626

## 2019-10-21 ENCOUNTER — Encounter (INDEPENDENT_AMBULATORY_CARE_PROVIDER_SITE_OTHER): Payer: Self-pay | Admitting: Nurse Practitioner

## 2019-10-21 ENCOUNTER — Ambulatory Visit (INDEPENDENT_AMBULATORY_CARE_PROVIDER_SITE_OTHER): Payer: 59 | Admitting: Nurse Practitioner

## 2019-10-21 DIAGNOSIS — R0602 Shortness of breath: Secondary | ICD-10-CM | POA: Insufficient documentation

## 2019-10-21 DIAGNOSIS — K219 Gastro-esophageal reflux disease without esophagitis: Secondary | ICD-10-CM

## 2019-10-21 MED ORDER — PANTOPRAZOLE SODIUM 40 MG PO TBEC: 40.0000 mg | DELAYED_RELEASE_TABLET | Freq: Two times a day (BID) | ORAL | 1 refills | Status: DC

## 2019-10-21 NOTE — Patient Instructions (Signed)
1.  Stop taking Goody powder.  No aspirin, Advil, Motrin, Aleve or Naproxen.  2.  Schedule a chest CT with IV contrast  3.  Schedule an EGD  4.  Increase pantoprazole 40 mg 1 capsule by mouth to be taken 30 minutes before breakfast and 1 capsule 30 minutes before dinner  5.  Smoking cessation encouraged  6.  Further follow-up to be determined after the above evaluation completed

## 2019-10-21 NOTE — Progress Notes (Addendum)
Subjective:    Patient ID: Christina Esparza, female    DOB: 03/06/1961, 58 y.o.   MRN: [id number]  HPI Christina Esparza is a 58 year old female with a past medical history of anxiety and depression. Past surgical history deviated septum 11/18/2018 and tubal ligation.  She presents today with complaints of reflux symptoms that have progressively worsened.  She reported having shortness of breath, describes having difficulty getting sufficient amount of air in with each breath which has persisted for the past year.  She reports having a nonproductive cough, her throat constantly feels raw and she has voice hoarseness.  She attributes all of the symptoms to having acid reflux.  She was seen by an ENT 10/2018 who recommended a deviated septal repair surgery which was done 11/18/2018.  She stated her symptoms did not improve after having this surgery.  Was taking omeprazole 40 mg once daily.  She recently saw another ENT in [place] who changed her Omeprazole to Pantoprazole 40 mg once daily.  A laryngoscopy was done which identified evidence of acid reflux.  She was referred to a pulmonologist, however, her primary care physician was able to complete PFTs in the office which were normal.  Therefore, she  did not pursue any further pulmonary evaluation.  She smokes 1 pack of cigarettes daily for the past 40 years.  She presented to [facility] emergency room on 09/13/2019 with shortness of breath.  No chest pain.  An EKG and chest x-ray were normal.  Her lab results including a D-dimer were normal as well.  She is quite frustrated regarding her continued shortness of breath and reflux symptoms.  She was only prescribed a course of Prednisone by her PCP without improvement.  She has a history of arthritis specifically to her hands.  She reports taking 2 packets of Goody powder daily for the past 2 to 3 years.  No alcohol use.  No drug use. She denies having any upper or lower abdominal pain.  She is  passing a normal formed brown bowel movement daily.  No rectal bleeding or melena.  She underwent a colonoscopy by Dr. Melony Overly 06/17/2013 which identified one polyp, however, biopsy showed polypoid tissue not a true polyp.  No family history of colorectal cancer.  Her mother is age 9 with Lewy body dementia and history of colon polyps.  Her father is age 71 with history of diabetes and sepsis.   Colonoscopy by Dr. Laural Golden 06/17/2013: Examination performed to cecum. Single small cecal erosion possibly secondary to aspirin use. Two small polyps ablated via cold biopsy and submitted together(descending and sigmoid colon). Biopsy showed polypoid mucosa and not true colon polyps Colonoscopy recall in 7 years  Past Medical History:  Diagnosis Date   Anxiety    Depression    Past Surgical History:  Procedure Laterality Date   COLONOSCOPY N/A 06/17/2013   Procedure: COLONOSCOPY;  Surgeon: Rogene Houston, MD;  [facility];  Service: Endoscopy;  Laterality: N/A;  730   ESOPHAGEAL DILATION N/A 11/15/2015   Procedure: ESOPHAGEAL DILATION;  Surgeon: Rogene Houston, MD;  [facility];  Service: Endoscopy;  Laterality: N/A;   ESOPHAGOGASTRODUODENOSCOPY N/A 11/15/2015   Procedure: ESOPHAGOGASTRODUODENOSCOPY (EGD);  Surgeon: Rogene Houston, MD;  [facility];  Service: Endoscopy;  Laterality: N/A;   NASAL SEPTOPLASTY W/ TURBINOPLASTY Bilateral 11/18/2018   Procedure: NASAL SEPTOPLASTY WITH BILATERAL TURBINATE REDUCTION;  Surgeon: Leta Baptist, MD;  Location: [facility];  Service:  ENT;  Laterality: Bilateral;   TUBAL LIGATION     Current Outpatient Medications on File Prior to Visit  Medication Sig Dispense Refill   ALPRAZolam (XANAX) 0.5 MG tablet Take 0.5 mg by mouth every 6 (six) hours as needed for anxiety.     predniSONE (DELTASONE) 5 MG tablet Take 5 mg by mouth daily with breakfast. Patient is currently taking 6 per day     sertraline (ZOLOFT)  100 MG tablet Take 150 mg by mouth daily.     SUMAtriptan (IMITREX) 50 MG tablet TK 1 T PO D PRF MIGRAINE  0   No current facility-administered medications on file prior to visit.    Allergies  Allergen Reactions   Amitiza [Lubiprostone] Itching    Patient states that she experienced a pin sticking sensation, prickling all over.   Linaclotide Nausea Only   Ceftin [Cefuroxime] Itching   Codeine Nausea And Vomiting   Levaquin [Levofloxacin In D5w]     dream   Penicillins     Felt like she was choking    Family History  Problem Relation Age of Onset   Colon polyps Mother    Colon cancer Neg Hx    Social History   Socioeconomic History   Marital status: Married    Spouse name: Not on file   Number of children: Not on file   Years of education: Not on file   Highest education level: Not on file  Occupational History   Not on file  Social Needs   Financial resource strain: Not on file   Food insecurity    Worry: Not on file    Inability: Not on file   Transportation needs    Medical: Not on file    Non-medical: Not on file  Tobacco Use   Smoking status: Current Every Day Smoker    Packs/day: 1.00    Types: Cigarettes   Smokeless tobacco: Never Used   Tobacco comment: over 20 yrs.   Substance and Sexual Activity   Alcohol use: No    Alcohol/week: 0.0 standard drinks   Drug use: No   Sexual activity: Not on file  Lifestyle   Physical activity    Days per week: Not on file    Minutes per session: Not on file   Stress: Not on file  Relationships   Social connections    Talks on phone: Not on file    Gets together: Not on file    Attends religious service: Not on file    Active member of club or organization: Not on file    Attends meetings of clubs or organizations: Not on file    Relationship status: Not on file   Intimate partner violence    Fear of current or ex partner: Not on file    Emotionally abused: Not on file     Physically abused: Not on file    Forced sexual activity: Not on file  Other Topics Concern   Not on file  Social History Narrative   Not on file    Review of Systems see HPI, all other systems reviewed and are negative     Objective:   Physical Exam  BP 126/83 (BP Location: Right Arm, Patient Position: Sitting, Cuff Size: Normal)    Pulse (!) 103    Temp 98.1 F (36.7 C) (Oral)    Ht 5\' 1"  (1.549 m)    Wt 117 lb 6.4 oz (53.3 kg)    BMI 22.18 kg/m  General: 58 year old female alert in no acute distress Eyes: Sclera nonicteric, conjunctiva pink Mouth: No ulcers or lesions Neck: Supple, no lymphadenopathy or thyromegaly Heart: Regular rate and rhythm, no murmurs Lungs: Breath sounds clear throughout Abdomen: Soft, nontender, no masses or organomegaly Extremities: No edema Neuro: Alert and oriented x4, no focal deficits     Assessment & Plan:   44.  58 year old female with laryngeal reflux confirmed by laryngeal Skippy by ENT.   -EGD to rule out reflux esophagitis, eosinophilic esophagitis, candidiasis esophagitis and upper GI malignancy.  EGD benefits and risk discussed including risk with sedation, risk of bleeding, perforation and infection -Pantoprazole 40 mg 1 p.o. twice daily -Stop Goody powder.  No NSAIDs discussed in full detail with patient. -Further follow-up to be determined after EGD completed  2.  Chronic shortness of breath.  Smoker 1 pack/day x 40 years. -Chest CT with IV contrast -smoking cessation encouraged  3.  Family history of colon polyp -Next colonoscopy due July 2021

## 2019-10-22 ENCOUNTER — Encounter (INDEPENDENT_AMBULATORY_CARE_PROVIDER_SITE_OTHER): Payer: Self-pay | Admitting: *Deleted

## 2019-10-22 ENCOUNTER — Other Ambulatory Visit (INDEPENDENT_AMBULATORY_CARE_PROVIDER_SITE_OTHER): Payer: Self-pay | Admitting: *Deleted

## 2019-10-22 DIAGNOSIS — K219 Gastro-esophageal reflux disease without esophagitis: Secondary | ICD-10-CM | POA: Insufficient documentation

## 2019-10-27 ENCOUNTER — Telehealth (INDEPENDENT_AMBULATORY_CARE_PROVIDER_SITE_OTHER): Payer: Self-pay | Admitting: Nurse Practitioner

## 2019-10-27 NOTE — Telephone Encounter (Signed)
Patient left voice mail message stating her acid reflux is getting worse - also stated she doesn't know if she can wait until January 20th to have her procedure - please advise - ph# [phone]

## 2019-10-28 ENCOUNTER — Other Ambulatory Visit (INDEPENDENT_AMBULATORY_CARE_PROVIDER_SITE_OTHER): Payer: Self-pay | Admitting: Nurse Practitioner

## 2019-10-28 MED ORDER — SUCRALFATE 1 G PO TABS: 1.0000 g | ORAL_TABLET | Freq: Two times a day (BID) | ORAL | 1 refills | Status: DC

## 2019-10-28 NOTE — Telephone Encounter (Signed)
I called the patient and she verified she is taking pantoprazole 40 mg twice daily as ordered at the time of her last office visit.  I will add Carafate 1 g p.o. twice daily not to be taken within 2 hours of any other medication.  If her symptoms worsen she will call our office and we will determine if an earlier EGD can be completed.  Ann if there are any earlier cancellations the patient would like her EGD earlier than her January scheduled date thanks

## 2019-10-29 NOTE — Telephone Encounter (Signed)
EGD moved to 12/28, patient aware

## 2019-11-09 ENCOUNTER — Other Ambulatory Visit: Payer: Self-pay

## 2019-11-09 ENCOUNTER — Ambulatory Visit (HOSPITAL_COMMUNITY)
Admission: RE | Admit: 2019-11-09 | Discharge: 2019-11-09 | Disposition: A | Discharge status: Home / Self Care | Payer: 59 | Source: Ambulatory Visit | Attending: Nurse Practitioner | Admitting: Nurse Practitioner

## 2019-11-09 DIAGNOSIS — R0602 Shortness of breath: Secondary | ICD-10-CM

## 2019-11-09 LAB — POCT I-STAT CREATININE: Creatinine, Ser: 1.1 mg/dL — ABNORMAL HIGH (ref 0.44–1.00)

## 2019-11-09 MED ORDER — IOHEXOL 300 MG/ML  SOLN
75.0000 mL | Freq: Once | INTRAMUSCULAR | Status: AC | PRN
  Administered 2019-11-09: 09:00:00 75 mL via INTRAVENOUS

## 2019-11-11 ENCOUNTER — Other Ambulatory Visit: Payer: Self-pay

## 2019-11-16 ENCOUNTER — Other Ambulatory Visit: Payer: Self-pay

## 2019-11-16 ENCOUNTER — Other Ambulatory Visit (HOSPITAL_COMMUNITY)
Admission: RE | Admit: 2019-11-16 | Discharge: 2019-11-16 | Disposition: A | Discharge status: Home / Self Care | Payer: 59 | Source: Ambulatory Visit | Attending: Internal Medicine | Admitting: Internal Medicine

## 2019-11-16 DIAGNOSIS — Z01812 Encounter for preprocedural laboratory examination: Secondary | ICD-10-CM | POA: Diagnosis not present

## 2019-11-16 DIAGNOSIS — Z20828 Contact with and (suspected) exposure to other viral communicable diseases: Secondary | ICD-10-CM | POA: Insufficient documentation

## 2019-11-16 LAB — SARS CORONAVIRUS 2 (TAT 6-24 HRS): SARS Coronavirus 2: NEGATIVE

## 2019-11-18 ENCOUNTER — Ambulatory Visit (HOSPITAL_COMMUNITY)
Admission: RE | Admit: 2019-11-18 | Discharge: 2019-11-18 | Disposition: A | Discharge status: Home / Self Care | Payer: 59 | Attending: Internal Medicine | Admitting: Internal Medicine

## 2019-11-18 ENCOUNTER — Encounter (HOSPITAL_COMMUNITY)
Admission: RE | Disposition: A | Discharge status: Home / Self Care | Payer: Self-pay | Source: Home / Self Care | Attending: Internal Medicine

## 2019-11-18 ENCOUNTER — Other Ambulatory Visit: Payer: Self-pay

## 2019-11-18 ENCOUNTER — Encounter (HOSPITAL_COMMUNITY): Payer: Self-pay | Admitting: Internal Medicine

## 2019-11-18 DIAGNOSIS — F17210 Nicotine dependence, cigarettes, uncomplicated: Secondary | ICD-10-CM | POA: Diagnosis not present

## 2019-11-18 DIAGNOSIS — K229 Disease of esophagus, unspecified: Secondary | ICD-10-CM | POA: Insufficient documentation

## 2019-11-18 DIAGNOSIS — K2100 Gastro-esophageal reflux disease with esophagitis, without bleeding: Secondary | ICD-10-CM | POA: Diagnosis not present

## 2019-11-18 DIAGNOSIS — F419 Anxiety disorder, unspecified: Secondary | ICD-10-CM | POA: Diagnosis not present

## 2019-11-18 DIAGNOSIS — K219 Gastro-esophageal reflux disease without esophagitis: Secondary | ICD-10-CM | POA: Diagnosis not present

## 2019-11-18 DIAGNOSIS — K315 Obstruction of duodenum: Secondary | ICD-10-CM | POA: Diagnosis not present

## 2019-11-18 DIAGNOSIS — K228 Other specified diseases of esophagus: Secondary | ICD-10-CM | POA: Diagnosis not present

## 2019-11-18 DIAGNOSIS — Z7952 Long term (current) use of systemic steroids: Secondary | ICD-10-CM | POA: Diagnosis not present

## 2019-11-18 DIAGNOSIS — F329 Major depressive disorder, single episode, unspecified: Secondary | ICD-10-CM | POA: Insufficient documentation

## 2019-11-18 DIAGNOSIS — K3189 Other diseases of stomach and duodenum: Secondary | ICD-10-CM

## 2019-11-18 DIAGNOSIS — Z79899 Other long term (current) drug therapy: Secondary | ICD-10-CM | POA: Insufficient documentation

## 2019-11-18 HISTORY — PX: ESOPHAGOGASTRODUODENOSCOPY: SHX5428

## 2019-11-18 HISTORY — PX: ESOPHAGEAL BRUSHING: SHX6842

## 2019-11-18 LAB — KOH PREP: KOH Prep: NONE SEEN

## 2019-11-18 SURGERY — EGD (ESOPHAGOGASTRODUODENOSCOPY)
Anesthesia: Moderate Sedation

## 2019-11-18 MED ORDER — MEPERIDINE HCL 50 MG/ML IJ SOLN
INTRAMUSCULAR | Status: DC | PRN
  Administered 2019-11-18 (×2): 25 mg via INTRAVENOUS

## 2019-11-18 MED ORDER — LIDOCAINE VISCOUS HCL 2 % MT SOLN
OROMUCOSAL | Status: AC
  Filled 2019-11-18: qty 15

## 2019-11-18 MED ORDER — MIDAZOLAM HCL 5 MG/5ML IJ SOLN
INTRAMUSCULAR | Status: DC | PRN
  Administered 2019-11-18 (×2): 2 mg via INTRAVENOUS
  Administered 2019-11-18: 1 mg via INTRAVENOUS
  Administered 2019-11-18: 2 mg via INTRAVENOUS
  Administered 2019-11-18: 1 mg via INTRAVENOUS

## 2019-11-18 MED ORDER — MIDAZOLAM HCL 5 MG/5ML IJ SOLN
INTRAMUSCULAR | Status: AC
  Filled 2019-11-18: qty 10

## 2019-11-18 MED ORDER — ESOMEPRAZOLE MAGNESIUM 40 MG PO CPDR: 40.0000 mg | DELAYED_RELEASE_CAPSULE | Freq: Every day | ORAL | 11 refills | Status: DC

## 2019-11-18 MED ORDER — SODIUM CHLORIDE 0.9 % IV SOLN
INTRAVENOUS | Status: DC
  Administered 2019-11-18: 1000 mL via INTRAVENOUS

## 2019-11-18 MED ORDER — METOCLOPRAMIDE HCL 10 MG PO TABS: 10.0000 mg | ORAL_TABLET | Freq: Two times a day (BID) | ORAL | 0 refills | Status: DC

## 2019-11-18 MED ORDER — MEPERIDINE HCL 50 MG/ML IJ SOLN
INTRAMUSCULAR | Status: AC
  Filled 2019-11-18: qty 1

## 2019-11-18 MED ORDER — FAMOTIDINE 20 MG PO TABS: 20.0000 mg | ORAL_TABLET | Freq: Every day | ORAL | Status: DC

## 2019-11-18 MED ORDER — STERILE WATER FOR IRRIGATION IR SOLN
Status: DC | PRN
  Administered 2019-11-18: 1.5 mL

## 2019-11-18 NOTE — Op Note (Signed)
[facility] Patient Name: Christina Esparza Procedure Date: 11/18/2019 11:35 AM MRN: [id number] Date of Birth: [birth year] Attending MD: Hildred Laser , MD CSN: [id number] Age: 58 Admit Type: Outpatient Procedure:                Upper GI endoscopy Indications:              Esophageal reflux symptoms that persist despite                            appropriate therapy Providers:                Hildred Laser, MD, Otis Peak B. Sharon Seller, RN, Raphael Gibney, Technician Referring MD:             Halford Chessman , MD Medicines:                Lidocaine spray, Meperidine 50 mg IV, Midazolam 8                            mg IV Complications:            No immediate complications. Estimated Blood Loss:     Estimated blood loss: none. Procedure:                Pre-Anesthesia Assessment:                           - Prior to the procedure, a History and Physical                            was performed, and patient medications and                            allergies were reviewed. The patient's tolerance of                            previous anesthesia was also reviewed. The risks                            and benefits of the procedure and the sedation                            options and risks were discussed with the patient.                            All questions were answered, and informed consent                            was obtained. Prior Anticoagulants: The patient has                            taken no previous anticoagulant or antiplatelet  agents. ASA Grade Assessment: II - A patient with                            mild systemic disease. After reviewing the risks                            and benefits, the patient was deemed in                            satisfactory condition to undergo the procedure.                           After obtaining informed consent, the endoscope was                            passed under direct  vision. Throughout the                            procedure, the patient's blood pressure, pulse, and                            oxygen saturations were monitored continuously. The                            [id number] ID:[id number]) scope was introduced through the                            mouth, and advanced to the second part of duodenum.                            The upper GI endoscopy was accomplished without                            difficulty. The patient tolerated the procedure                            well. Scope In: 12:04:02 PM Scope Out: 12:15:33 PM Total Procedure Duration: 0 hours 11 minutes 31 seconds  Findings:      The hypopharynx was normal.      Localized plaques were found in the distal esophagus.      LA Grade B (one or more mucosal breaks greater than 5 mm, not extending       between the tops of two mucosal folds) esophagitis was found 37 to 38 cm       from the incisors.      The Z-line was irregular and was found 38 cm from the incisors.      Localized mildly erythematous mucosa without bleeding was found in the       prepyloric region of the stomach.      The exam of the stomach was otherwise normal.      The duodenal bulb was normal.      An acquired benign-appearing, intrinsic mild stenosis was found in the       second portion of the duodenum and was traversed.      The second  portion of the duodenum was normal. Impression:               - Normal hypopharynx.                           - localized coating of mucosa with yellowish                            material possibly sucralfate. Brushing taken for                            KOH. Doubt candidiasis.                           - LA Grade B reflux esophagitis.                           - Z-line irregular, 38 cm from the incisors.                           - Erythematous mucosa in the prepyloric region of                            the stomach.                           - Normal duodenal bulb.                            - Acquired duodenal stenosis.                           - Normal second portion of the duodenum.                           - No specimens collected. Moderate Sedation:      Moderate (conscious) sedation was administered by the endoscopy nurse       and supervised by the endoscopist. The following parameters were       monitored: oxygen saturation, heart rate, blood pressure, CO2       capnography and response to care. Total physician intraservice time was       20 minutes. Recommendation:           - Patient has a contact number available for                            emergencies. The signs and symptoms of potential                            delayed complications were discussed with the                            patient. Return to normal activities tomorrow.                            Written discharge instructions were provided to the  patient.                           - Discontinue {Pantoprazole and sucralfate but                            continue other medications.                           - Resume previous diet today.                           - Continue present medications.                           - Use Nexium (esomeprazole) 40 mg PO daily today.                           - Famotidine OTC 20 mg po qhs.                           - Metoclopromide 10 mg po bid.                           - OV in one month. Procedure Code(s):        --- Professional ---                           [phone], Esophagogastroduodenoscopy, flexible,                            transoral; diagnostic, including collection of                            specimen(s) by brushing or washing, when performed                            (separate procedure)                           G0500, Moderate sedation services provided by the                            same physician or other qualified health care                            professional performing a gastrointestinal                             endoscopic service that sedation supports,                            requiring the presence of an independent trained                            observer to assist in the monitoring of the  patient's level of consciousness and physiological                            status; initial 15 minutes of intra-service time;                            patient age 43 years or older (additional time may                            be reported with [phone], as appropriate) Diagnosis Code(s):        --- Professional ---                           K22.9, Disease of esophagus, unspecified                           K21.00, Gastro-esophageal reflux disease with                            esophagitis, without bleeding                           K22.8, Other specified diseases of esophagus                           K31.89, Other diseases of stomach and duodenum                           K31.5, Obstruction of duodenum CPT copyright 2019 [employer]. All rights reserved. The codes documented in this report are preliminary and upon coder review may  be revised to meet current compliance requirements. Hildred Laser, MD Hildred Laser, MD 11/18/2019 12:33:45 PM This report has been signed electronically. Number of Addenda: 0

## 2019-11-18 NOTE — Discharge Instructions (Signed)
Discontinue pantoprazole and sucralfate. Take esomeprazole/Nexium 40 mg by mouth 30 minutes before breakfast daily. Pepcid/famotidine OTC 20 mg by mouth daily at bedtime. Metoclopramide 10 mg by mouth 30 minutes before breakfast and evening meal.  This is a short-term medication.  If you experience any side effects stop the medication and call office. Resume diet as before. Antireflux measures sheet Physician will call with results of KOH prep(to check for yeast) Office visit in 4 weeks.   Upper Endoscopy, Adult, Care After This sheet gives you information about how to care for yourself after your procedure. Your health care provider may also give you more specific instructions. If you have problems or questions, contact your health care provider. What can I expect after the procedure? After the procedure, it is common to have:  A sore throat.  Mild stomach pain or discomfort.  Bloating.  Nausea. Follow these instructions at home:   Follow instructions from your health care provider about what to eat or drink after your procedure.  Return to your normal activities as told by your health care provider. Ask your health care provider what activities are safe for you.  Take over-the-counter and prescription medicines only as told by your health care provider.  Do not drive for 24 hours if you were given a sedative during your procedure.  Keep all follow-up visits as told by your health care provider. This is important. Contact a health care provider if you have:  A sore throat that lasts longer than one day.  Trouble swallowing. Get help right away if:  You vomit blood or your vomit looks like coffee grounds.  You have: ? A fever. ? Bloody, black, or tarry stools. ? A severe sore throat or you cannot swallow. ? Difficulty breathing. ? Severe pain in your chest or abdomen. Summary  After the procedure, it is common to have a sore throat, mild stomach discomfort,  bloating, and nausea.  Do not drive for 24 hours if you were given a sedative during the procedure.  Follow instructions from your health care provider about what to eat or drink after your procedure.  Return to your normal activities as told by your health care provider. This information is not intended to replace advice given to you by your health care provider. Make sure you discuss any questions you have with your health care provider. Document Released: 05/06/2012 Document Revised: 04/29/2018 Document Reviewed: 04/07/2018 Elsevier Patient Education  2020 [employer].  Gastroesophageal Reflux Disease, Adult Gastroesophageal reflux (GER) happens when acid from the stomach flows up into the tube that connects the mouth and the stomach (esophagus). Normally, food travels down the esophagus and stays in the stomach to be digested. However, when a person has GER, food and stomach acid sometimes move back up into the esophagus. If this becomes a more serious problem, the person may be diagnosed with a disease called gastroesophageal reflux disease (GERD). GERD occurs when the reflux:  Happens often.  Causes frequent or severe symptoms.  Causes problems such as damage to the esophagus. When stomach acid comes in contact with the esophagus, the acid may cause soreness (inflammation) in the esophagus. Over time, GERD may create small holes (ulcers) in the lining of the esophagus. What are the causes? This condition is caused by a problem with the muscle between the esophagus and the stomach (lower esophageal sphincter, or LES). Normally, the LES muscle closes after food passes through the esophagus to the stomach. When the LES is weakened or  abnormal, it does not close properly, and that allows food and stomach acid to go back up into the esophagus. The LES can be weakened by certain dietary substances, medicines, and medical conditions, including:  Tobacco use.  Pregnancy.  Having a hiatal  hernia.  Alcohol use.  Certain foods and beverages, such as coffee, chocolate, onions, and peppermint. What increases the risk? You are more likely to develop this condition if you:  Have an increased body weight.  Have a connective tissue disorder.  Use NSAID medicines. What are the signs or symptoms? Symptoms of this condition include:  Heartburn.  Difficult or painful swallowing.  The feeling of having a lump in the throat.  Abitter taste in the mouth.  Bad breath.  Having a large amount of saliva.  Having an upset or bloated stomach.  Belching.  Chest pain. Different conditions can cause chest pain. Make sure you see your health care provider if you experience chest pain.  Shortness of breath or wheezing.  Ongoing (chronic) cough or a night-time cough.  Wearing away of tooth enamel.  Weight loss. How is this diagnosed? Your health care provider will take a medical history and perform a physical exam. To determine if you have mild or severe GERD, your health care provider may also monitor how you respond to treatment. You may also have tests, including:  A test to examine your stomach and esophagus with a small camera (endoscopy).  A test thatmeasures the acidity level in your esophagus.  A test thatmeasures how much pressure is on your esophagus.  A barium swallow or modified barium swallow test to show the shape, size, and functioning of your esophagus. How is this treated? The goal of treatment is to help relieve your symptoms and to prevent complications. Treatment for this condition may vary depending on how severe your symptoms are. Your health care provider may recommend:  Changes to your diet.  Medicine.  Surgery. Follow these instructions at home: Eating and drinking   Follow a diet as recommended by your health care provider. This may involve avoiding foods and drinks such as: ? Coffee and tea (with or without caffeine). ? Drinks that  containalcohol. ? Energy drinks and sports drinks. ? Carbonated drinks or sodas. ? Chocolate and cocoa. ? Peppermint and mint flavorings. ? Garlic and onions. ? Horseradish. ? Spicy and acidic foods, including peppers, chili powder, curry powder, vinegar, hot sauces, and barbecue sauce. ? Citrus fruit juices and citrus fruits, such as oranges, lemons, and limes. ? Tomato-based foods, such as red sauce, chili, salsa, and pizza with red sauce. ? Fried and fatty foods, such as donuts, french fries, potato chips, and high-fat dressings. ? High-fat meats, such as hot dogs and fatty cuts of red and white meats, such as rib eye steak, sausage, ham, and bacon. ? High-fat dairy items, such as whole milk, butter, and cream cheese.  Eat small, frequent meals instead of large meals.  Avoid drinking large amounts of liquid with your meals.  Avoid eating meals during the 2-3 hours before bedtime.  Avoid lying down right after you eat.  Do not exercise right after you eat. Lifestyle   Do not use any products that contain nicotine or tobacco, such as cigarettes, e-cigarettes, and chewing tobacco. If you need help quitting, ask your health care provider.  Try to reduce your stress by using methods such as yoga or meditation. If you need help reducing stress, ask your health care provider.  If you are  overweight, reduce your weight to an amount that is healthy for you. Ask your health care provider for guidance about a safe weight loss goal. General instructions  Pay attention to any changes in your symptoms.  Take over-the-counter and prescription medicines only as told by your health care provider. Do not take aspirin, ibuprofen, or other NSAIDs unless your health care provider told you to do so.  Wear loose-fitting clothing. Do not wear anything tight around your waist that causes pressure on your abdomen.  Raise (elevate) the head of your bed about 6 inches (15 cm).  Avoid bending over if  this makes your symptoms worse.  Keep all follow-up visits as told by your health care provider. This is important. Contact a health care provider if:  You have: ? New symptoms. ? Unexplained weight loss. ? Difficulty swallowing or it hurts to swallow. ? Wheezing or a persistent cough. ? A hoarse voice.  Your symptoms do not improve with treatment. Get help right away if you:  Have pain in your arms, neck, jaw, teeth, or back.  Feel sweaty, dizzy, or light-headed.  Have chest pain or shortness of breath.  Vomit and your vomit looks like blood or coffee grounds.  Faint.  Have stool that is bloody or black.  Cannot swallow, drink, or eat. Summary  Gastroesophageal reflux happens when acid from the stomach flows up into the esophagus. GERD is a disease in which the reflux happens often, causes frequent or severe symptoms, or causes problems such as damage to the esophagus.  Treatment for this condition may vary depending on how severe your symptoms are. Your health care provider may recommend diet and lifestyle changes, medicine, or surgery.  Contact a health care provider if you have new or worsening symptoms.  Take over-the-counter and prescription medicines only as told by your health care provider. Do not take aspirin, ibuprofen, or other NSAIDs unless your health care provider told you to do so.  Keep all follow-up visits as told by your health care provider. This is important. This information is not intended to replace advice given to you by your health care provider. Make sure you discuss any questions you have with your health care provider. Document Released: 08/15/2005 Document Revised: 05/14/2018 Document Reviewed: 05/14/2018 Elsevier Patient Education  2020 [employer].

## 2019-11-18 NOTE — H&P (Addendum)
Christina Esparza is an 58 y.o. female.   Chief Complaint: Christina Esparza is here for esophagogastroduodenoscopy HPI: Christina Esparza is 58 year old Caucasian female who has chronic GERD and not responding to therapy.  She says she is not having heartburn while on PPI but she has throat symptoms.  She has hoarseness sore throat and feeling of lump in her throat.  At times she feels as if she has caught in her throat.  She denies dysphagia or heartburn.  She did not respond to 40 mg of pantoprazole daily.  She was unable to tolerate 80 mg/day because of dizziness.   She denies nausea vomiting abdominal pain or melena.  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 Christina Esparza does not drink alcohol.  She smokes cigarettes.   Past Medical History:  Diagnosis Date  . Anxiety   . Depression     Past Surgical History:  Procedure Laterality Date  . COLONOSCOPY N/A 06/17/2013   Procedure: COLONOSCOPY;  Surgeon: Rogene Houston, MD;  [facility];  Service: Endoscopy;  Laterality: N/A;  730  . ESOPHAGEAL DILATION N/A 11/15/2015   Procedure: ESOPHAGEAL DILATION;  Surgeon: Rogene Houston, MD;  [facility];  Service: Endoscopy;  Laterality: N/A;  . ESOPHAGOGASTRODUODENOSCOPY N/A 11/15/2015   Procedure: ESOPHAGOGASTRODUODENOSCOPY (EGD);  Surgeon: Rogene Houston, MD;  [facility];  Service: Endoscopy;  Laterality: N/A;  . NASAL SEPTOPLASTY W/ TURBINOPLASTY Bilateral 11/18/2018   Procedure: NASAL SEPTOPLASTY WITH BILATERAL TURBINATE REDUCTION;  Surgeon: Leta Baptist, MD;  Location: [facility];  Service: ENT;  Laterality: Bilateral;  . TUBAL LIGATION      Family History  Problem Relation Age of Onset  . Colon polyps Mother   . Prostate cancer Father   . Colon cancer Neg Hx    Social History:  reports that she has been smoking cigarettes. She has been smoking about 1.00 pack per day. She has never used smokeless tobacco. She  reports that she does not drink alcohol or use drugs.  Allergies:  Allergies  Allergen Reactions  . Amitiza [Lubiprostone] Itching    Christina Esparza states that she experienced a pin sticking sensation, prickling all over.  . Linaclotide Nausea Only  . Ceftin [Cefuroxime] Itching  . Codeine Nausea And Vomiting  . Levaquin [Levofloxacin In D5w]     dream  . Penicillins Other (See Comments)    Felt like she was choking Did it involve swelling of the face/tongue/throat, SOB, or low BP? Unknown Did it involve sudden or severe rash/hives, skin peeling, or any reaction on the inside of your mouth or nose? No Did you need to seek medical attention at a hospital or doctor's office? No When did it last happen?~10 years ago If all above answers are "NO", may proceed with cephalosporin use.     Medications Prior to Admission  Medication Sig Dispense Refill  . ALPRAZolam (XANAX) 1 MG tablet Take 1 mg by mouth 3 (three) times daily as needed for anxiety.    . carboxymethylcellulose (REFRESH PLUS) 0.5 % SOLN 1 drop 3 (three) times daily as needed.    . cyclobenzaprine (FLEXERIL) 10 MG tablet Take 10 mg by mouth at bedtime as needed for spasms.    Marland Kitchen dicyclomine (BENTYL) 10 MG capsule Take 10 mg by mouth 4 (four) times daily as needed (abdominal bloating/spasms.).     Marland Kitchen naphazoline-pheniramine (ALLERGY EYE) 0.025-0.3 % ophthalmic solution Place 1-2 drops into both  eyes 4 (four) times daily as needed for eye irritation or allergies.    . pantoprazole (PROTONIX) 40 MG tablet Take 1 tablet (40 mg total) by mouth 2 (two) times daily. 60 tablet 1  . PROAIR HFA 108 (90 Base) MCG/ACT inhaler Inhale 2 puffs into the lungs every 4 (four) hours as needed for wheezing or shortness of breath.    . sertraline (ZOLOFT) 50 MG tablet Take 150 mg by mouth every evening.    . sucralfate (CARAFATE) 1 g tablet Take 1 tablet (1 g total) by mouth 2 (two) times daily. Do not take within 2 hours of any other medications 30 tablet  1  . predniSONE (DELTASONE) 5 MG tablet Take 5 mg by mouth daily with breakfast. Christina Esparza is currently taking 6 per day      No results found for this or any previous visit (from the past 48 hour(s)). No results found.  Review of Systems  Blood pressure 136/79, pulse 62, temperature (!) 96.6 F (35.9 C), temperature source Axillary, resp. rate 17, height 5\' 1"  (1.549 m), weight 53.1 kg, SpO2 100 %. Physical Exam  Constitutional: She appears well-developed and well-nourished.  HENT:  Mouth/Throat: Oropharynx is clear and moist.  Eyes: Conjunctivae are normal. No scleral icterus.  Neck: No thyromegaly present.  Cardiovascular: Normal rate, regular rhythm and normal heart sounds.  No murmur heard. Respiratory: Effort normal and breath sounds normal.  GI: Soft. She exhibits no distension and no mass. There is no abdominal tenderness.  Musculoskeletal:        General: No edema.  Lymphadenopathy:    She has no cervical adenopathy.  Neurological: She is alert.  Skin: Skin is warm.     Assessment/Plan  GERD with atypical symptoms unresponsive to therapy. Diagnostic esophagogastroduodenoscopy.  Hildred Laser, MD 11/18/2019, 11:50 AM

## 2019-11-26 ENCOUNTER — Ambulatory Visit (INDEPENDENT_AMBULATORY_CARE_PROVIDER_SITE_OTHER): Payer: 59 | Admitting: Gastroenterology

## 2019-12-07 ENCOUNTER — Encounter (INDEPENDENT_AMBULATORY_CARE_PROVIDER_SITE_OTHER): Payer: Self-pay

## 2019-12-07 ENCOUNTER — Other Ambulatory Visit (HOSPITAL_COMMUNITY): Payer: 59

## 2019-12-17 ENCOUNTER — Ambulatory Visit (INDEPENDENT_AMBULATORY_CARE_PROVIDER_SITE_OTHER): Payer: 59 | Admitting: Gastroenterology

## 2019-12-17 ENCOUNTER — Encounter (INDEPENDENT_AMBULATORY_CARE_PROVIDER_SITE_OTHER): Payer: Self-pay | Admitting: Gastroenterology

## 2019-12-17 ENCOUNTER — Other Ambulatory Visit: Payer: Self-pay

## 2019-12-17 VITALS — BP 139/82 | HR 89 | Temp 96.6°F | Ht 61.0 in | Wt 117.4 lb

## 2019-12-17 DIAGNOSIS — R0989 Other specified symptoms and signs involving the circulatory and respiratory systems: Secondary | ICD-10-CM

## 2019-12-17 DIAGNOSIS — Z86010 Personal history of colonic polyps: Secondary | ICD-10-CM

## 2019-12-17 DIAGNOSIS — K219 Gastro-esophageal reflux disease without esophagitis: Secondary | ICD-10-CM | POA: Diagnosis not present

## 2019-12-17 MED ORDER — OMEPRAZOLE 40 MG PO CPDR: 40.0000 mg | DELAYED_RELEASE_CAPSULE | Freq: Two times a day (BID) | ORAL | 3 refills | Status: DC

## 2019-12-17 NOTE — Progress Notes (Signed)
Patient profile: Christina Esparza is a 59 y.o. female seen for f/up of GERD/throat pain.   History of Present Illness: Christina Esparza is seen today for continued symptoms of having a "lump in her throat"  She denies any specific dysphagia with a globus sensation.  The symptoms started after her deviated septum surgery approximately 13 months ago and has become worsening.  She has seen 2 ENT physicians, reports she was initially diagnosed with silent reflux by 1 ENT physician.  She started pantoprazole and was taking 1 a day which did not seem to help symptoms.  She increased pantoprazole to twice a day which did help her lump and coughing up phlegm sensation some better but caused her to be very dizzy.  Sometimes feels like she cannot get a good breath due to the lump sensation.  She denies any nausea or vomiting.  She has very rare heartburn currently on 40 mg omeprazole 1x/day.  She has no dysphagia or epigastric pain.  Does feel some phlegm draining down the back of her throat, worse when she wakes up in the mornings.  She eats dinner at 5-6 PM and goes to bed at 11.  She does not lie flat after meals.  She has no lower GI complaints.  Denies constipation, diarrhea, melena, rectal bleeding.  She has chronic issues with abdominal pain and uses Bentyl a few times a year with complete resolution of symptoms.  She smokes approximately a pack and half a day, she is usually drinking 3 sodas a day.  She does admit to taking Goody's typically twice a day for hand pain.  Wt Readings from Last 3 Encounters:  12/17/19 117 lb 6.4 oz (53.3 kg)  11/18/19 117 lb (53.1 kg)  10/21/19 117 lb 6.4 oz (53.3 kg)     Last Colonoscopy: Findings:   05/2013- Prep satisfactory. Tortuous sigmoid colon. Single small cecal erosion possibly secondary to aspirin use. Two small polyps ablated via cold biopsy and submitted together. These are located at the descending and sigmoid colon. Normal rectal mucosa and anal  rectal junction. Slim colonoscope used in order to advance scope through tortuous sigmoid colon. Path---7 year f/up recommended n   Last Endoscopy: 11/18/19---Normal hypopharynx. - localized coating of mucosa with yellowish material possibly sucralfate. Brushing taken for KOH. Doubt candidiasis. - LA Grade B reflux esophagitis. - Z-line irregular, 38 cm from the incisors. - Erythematous mucosa in the prepyloric region of the stomach. - Normal duodenal bulb. - Acquired duodenal stenosis. - Normal second portion of the duodenum. - No specimens collected. Path--- Specimen Description ESOPHAGUS BRUSHING   Special Requests NONE   KOH Prep NO YEAST OR FUNGAL ELEMENTS SEEN      Past Medical History:  Past Medical History:  Diagnosis Date  . Anxiety   . Depression     Problem List: Patient Active Problem List   Diagnosis Date Noted  . Gastroesophageal reflux disease 10/22/2019  . GERD (gastroesophageal reflux disease) 10/21/2019  . Shortness of breath 10/21/2019  . Anxiety state 08/24/2015    Past Surgical History: Past Surgical History:  Procedure Laterality Date  . COLONOSCOPY N/A 06/17/2013   Procedure: COLONOSCOPY;  Surgeon: Rogene Houston, MD;  [facility];  Service: Endoscopy;  Laterality: N/A;  730  . ESOPHAGEAL BRUSHING  11/18/2019   Procedure: ESOPHAGEAL BRUSHING;  Surgeon: Rogene Houston, MD;  [facility];  Service: Endoscopy;;  . ESOPHAGEAL DILATION N/A 11/15/2015   Procedure: ESOPHAGEAL DILATION;  Surgeon: Rogene Houston, MD;  [facility];  Service: Endoscopy;  Laterality: N/A;  . ESOPHAGOGASTRODUODENOSCOPY N/A 11/15/2015   Procedure: ESOPHAGOGASTRODUODENOSCOPY (EGD);  Surgeon: Rogene Houston, MD;  [facility];  Service: Endoscopy;  Laterality: N/A;  . ESOPHAGOGASTRODUODENOSCOPY N/A 11/18/2019   Procedure: ESOPHAGOGASTRODUODENOSCOPY (EGD);  Surgeon: Rogene Houston, MD;  [facility];  Service:  Endoscopy;  Laterality: N/A;  2:25  . NASAL SEPTOPLASTY W/ TURBINOPLASTY Bilateral 11/18/2018   Procedure: NASAL SEPTOPLASTY WITH BILATERAL TURBINATE REDUCTION;  Surgeon: Leta Baptist, MD;  Location: [facility];  Service: ENT;  Laterality: Bilateral;  . TUBAL LIGATION      Allergies: Allergies  Allergen Reactions  . Amitiza [Lubiprostone] Itching    Patient states that she experienced a pin sticking sensation, prickling all over.  . Linaclotide Nausea Only  . Ceftin [Cefuroxime] Itching  . Codeine Nausea And Vomiting  . Levaquin [Levofloxacin In D5w]     dream  . Penicillins Other (See Comments)    Felt like she was choking Did it involve swelling of the face/tongue/throat, SOB, or low BP? Unknown Did it involve sudden or severe rash/hives, skin peeling, or any reaction on the inside of your mouth or nose? No Did you need to seek medical attention at a hospital or doctor's office? No When did it last happen?~10 years ago If all above answers are "NO", may proceed with cephalosporin use.       Home Medications:  Current Outpatient Medications:  .  ALPRAZolam (XANAX) 1 MG tablet, Take 1 mg by mouth 3 (three) times daily as needed for anxiety., Disp: , Rfl:  .  carboxymethylcellulose (REFRESH PLUS) 0.5 % SOLN, 1 drop 3 (three) times daily as needed., Disp: , Rfl:  .  chlorhexidine (PERIDEX) 0.12 % solution, SMARTSIG:15 Milliliter(s) By Mouth Morning-Night, Disp: , Rfl:  .  cyclobenzaprine (FLEXERIL) 10 MG tablet, Take 10 mg by mouth at bedtime as needed for spasms., Disp: , Rfl:  .  dicyclomine (BENTYL) 10 MG capsule, Take 10 mg by mouth 4 (four) times daily as needed (abdominal bloating/spasms.). , Disp: , Rfl:  .  esomeprazole (NEXIUM) 40 MG capsule, Take 1 capsule (40 mg total) by mouth daily before breakfast., Disp: 30 capsule, Rfl: 11 .  famotidine (PEPCID) 20 MG tablet, Take 1 tablet (20 mg total) by mouth at bedtime., Disp: , Rfl:  .  metoCLOPramide (REGLAN) 10 MG  tablet, Take 1 tablet (10 mg total) by mouth 2 (two) times daily after a meal., Disp: 60 tablet, Rfl: 0 .  naphazoline-pheniramine (ALLERGY EYE) 0.025-0.3 % ophthalmic solution, Place 1-2 drops into both eyes 4 (four) times daily as needed for eye irritation or allergies., Disp: , Rfl:  .  PROAIR HFA 108 (90 Base) MCG/ACT inhaler, Inhale 2 puffs into the lungs every 4 (four) hours as needed for wheezing or shortness of breath., Disp: , Rfl:  .  sertraline (ZOLOFT) 50 MG tablet, Take 150 mg by mouth every evening., Disp: , Rfl:    Family History: family history includes Colon polyps in her mother; Prostate cancer in her father.    Social History:   reports that she has been smoking cigarettes. She has been smoking about 1.00 pack per day. She has never used smokeless tobacco. She reports that she does not drink alcohol or use drugs.   Review of Systems: Constitutional: Denies weight loss/weight gain  Eyes: No changes in vision. ENT: No oral lesions, sore throat.  GI: see  HPI.  Heme/Lymph: No easy bruising.  CV: No chest pain.  GU: No hematuria.  Integumentary: No rashes.  Neuro: No headaches.  Psych: No depression/anxiety.  Endocrine: No heat/cold intolerance.  Allergic/Immunologic: No urticaria.  Resp: No cough, SOB.  Musculoskeletal: No joint swelling.    Physical Examination: BP 139/82 (BP Location: Right Arm, Patient Position: Sitting, Cuff Size: Normal)   Pulse 89   Temp (!) 96.6 F (35.9 C) (Temporal)   Ht 5\' 1"  (1.549 m)   Wt 117 lb 6.4 oz (53.3 kg)   BMI 22.18 kg/m  Gen: NAD, alert and oriented x 4 HEENT: PEERLA, EOMI, Neck: supple, no JVD Chest: CTA bilaterally, no wheezes, crackles, or other adventitious sounds CV: RRR, no m/g/c/r Abd: soft, NT, ND, +BS in all four quadrants; no HSM, guarding, ridigity, or rebound tenderness Ext: no edema, well perfused with 2+ pulses, Skin: no rash or lesions noted on observed skin Lymph: no noted LAD  Data  Reviewed:  EGD/colonsocopy as above;   Assessment/Plan: Ms. Bruun is a 59 y.o. female   1. GERD-she is on omeprazole 40 mg daily, she felt that pantoprazole 40 mg twice daily helped her reflux symptoms more but unfortunately caused severe dizziness.  We will try increasing omeprazole to twice a day given her grade B esophagitis on recent EGD, she is notify me if dizziness recurs.  We did have an extensive discussion about her multiple risk factors worsening GERD including daily use of Goody powders, 1.5 pack a day tobacco, several sodas a day.  She is going to try elevating her head of bed and diet modification. Currently on reglan bid but no improvement and no early satiety, nausea, etc so she will stop this.   2.  Globus/congestion/sore throate-EGD overall unremarkable for etiology of globus. Also negative ENT eval x 2.  This seem to worsen after her nasal septum surgery.  I have recommended she try an antihistamine to see if will help symptoms.   3.  History of colon polyps-due for colonoscopy July 2021.  F/up in office 3 months, call sooner if needed. Due for colonoscopy this summer.    25 min total pt care >50% face to face   I personally performed the service, non-incident to. (WP)  Laurine Blazer, [facility]

## 2019-12-17 NOTE — Patient Instructions (Addendum)
-  We are increasing omeprazole 40mg  to twice a day (take 30 min before meals)   -Okay to stop Reglan for now - if symptoms worsen can restart   - Try to decrease goody's/bcs - this is important   -For post nasal drip like symptoms okay to try allergy medication (zyrtec, allegra, Claritin, etc)   - Limit tobacco, sodas, etc. Remain upright after meals

## 2019-12-23 ENCOUNTER — Other Ambulatory Visit (INDEPENDENT_AMBULATORY_CARE_PROVIDER_SITE_OTHER): Payer: Self-pay | Admitting: Nurse Practitioner

## 2019-12-23 DIAGNOSIS — K219 Gastro-esophageal reflux disease without esophagitis: Secondary | ICD-10-CM

## 2019-12-23 NOTE — Telephone Encounter (Signed)
Janice pls mange rx request thx

## 2020-01-18 ENCOUNTER — Telehealth (INDEPENDENT_AMBULATORY_CARE_PROVIDER_SITE_OTHER): Payer: Self-pay | Admitting: Gastroenterology

## 2020-01-18 NOTE — Telephone Encounter (Signed)
Patient left message stating Thayer Headings had prescribed her some medication that is not working - please advise - [phone]

## 2020-01-20 ENCOUNTER — Encounter (INDEPENDENT_AMBULATORY_CARE_PROVIDER_SITE_OTHER): Payer: Self-pay | Admitting: *Deleted

## 2020-01-20 NOTE — Telephone Encounter (Signed)
Patient has been sent a My Chart Message asking that she follow up with her Insurance Company to see which PPI they will cover. Once we get that inforamation a choice will be made for her.

## 2020-01-21 NOTE — Telephone Encounter (Signed)
Patient called and a vociemail was left. Ask patient to share questions/issues through My Chart.

## 2020-01-21 NOTE — Telephone Encounter (Signed)
Patient left message stated you gave her a Dahlia Client to go - she needs you to call her about this issue - ph# [phone]

## 2020-01-25 ENCOUNTER — Other Ambulatory Visit: Payer: Self-pay

## 2020-01-25 ENCOUNTER — Ambulatory Visit
Admission: EM | Admit: 2020-01-25 | Discharge: 2020-01-25 | Disposition: A | Discharge status: Home / Self Care | Payer: 59 | Attending: Emergency Medicine | Admitting: Emergency Medicine

## 2020-01-25 DIAGNOSIS — Z20822 Contact with and (suspected) exposure to covid-19: Secondary | ICD-10-CM | POA: Diagnosis not present

## 2020-01-25 DIAGNOSIS — J029 Acute pharyngitis, unspecified: Secondary | ICD-10-CM

## 2020-01-25 LAB — POCT RAPID STREP A (OFFICE): Rapid Strep A Screen: NEGATIVE

## 2020-01-25 MED ORDER — LIDOCAINE VISCOUS HCL 2 % MT SOLN: 15.0000 mL | OROMUCOSAL | 1 refills | Status: DC | PRN

## 2020-01-25 MED ORDER — FLUTICASONE PROPIONATE 50 MCG/ACT NA SUSP: 1.0000 | Freq: Every day | NASAL | 0 refills | Status: DC

## 2020-01-25 NOTE — Discharge Instructions (Signed)
Strep test was negative Continue to ice your face medication as directed Viscous Lidocaine was prescribed Avoid chocolate caffeinated drink alcohol and mint Follow-up with GI specialist for further evaluation and management Return for worsening of symptoms Covid test will take 2 to 7 days for results to return.  Someone will call you if your result is positive.

## 2020-01-25 NOTE — ED Provider Notes (Signed)
[facility]    CSN: [id number] Arrival date & time: 01/25/20  1801      History   Chief Complaint Chief Complaint  Patient presents with  . Sore Throat  . Otalgia    HPI Christina Esparza is a 59 y.o. female.   With history of GERD presented to the urgent care with a complaint of sore throat,cough and ear pain for the past 2-3 days.  Reported she has started lansoprazole and develop these symptoms.  Was off PPI medication last week. Localized discomfort to throat and eosphagus.  Described as worsening and achy in character.  Rated pain at 7 on a scale of 1-10.  Has not used any OTC medication.  Denies chills, fever, nausea, vomiting, diarrhea, chest pain, chest tightness.  The history is provided by the patient. No language interpreter was used.  Sore Throat  Otalgia Associated symptoms: sore throat     Past Medical History:  Diagnosis Date  . Anxiety   . Depression     Patient Active Problem List   Diagnosis Date Noted  . Gastroesophageal reflux disease 10/22/2019  . GERD (gastroesophageal reflux disease) 10/21/2019  . Shortness of breath 10/21/2019  . Anxiety state 08/24/2015    Past Surgical History:  Procedure Laterality Date  . COLONOSCOPY N/A 06/17/2013   Procedure: COLONOSCOPY;  Surgeon: Rogene Houston, MD;  [facility];  Service: Endoscopy;  Laterality: N/A;  730  . ESOPHAGEAL BRUSHING  11/18/2019   Procedure: ESOPHAGEAL BRUSHING;  Surgeon: Rogene Houston, MD;  [facility];  Service: Endoscopy;;  . ESOPHAGEAL DILATION N/A 11/15/2015   Procedure: ESOPHAGEAL DILATION;  Surgeon: Rogene Houston, MD;  [facility];  Service: Endoscopy;  Laterality: N/A;  . ESOPHAGOGASTRODUODENOSCOPY N/A 11/15/2015   Procedure: ESOPHAGOGASTRODUODENOSCOPY (EGD);  Surgeon: Rogene Houston, MD;  [facility];  Service: Endoscopy;  Laterality: N/A;  . ESOPHAGOGASTRODUODENOSCOPY N/A 11/18/2019   Procedure:  ESOPHAGOGASTRODUODENOSCOPY (EGD);  Surgeon: Rogene Houston, MD;  [facility];  Service: Endoscopy;  Laterality: N/A;  2:25  . NASAL SEPTOPLASTY W/ TURBINOPLASTY Bilateral 11/18/2018   Procedure: NASAL SEPTOPLASTY WITH BILATERAL TURBINATE REDUCTION;  Surgeon: Leta Baptist, MD;  Location: [place];  Service: ENT;  Laterality: Bilateral;  . TUBAL LIGATION      OB History   No obstetric history on file.      Home Medications    Prior to Admission medications   Medication Sig Start Date End Date Taking? Authorizing Provider  ALPRAZolam Duanne Moron) 1 MG tablet Take 1 mg by mouth 3 (three) times daily as needed for anxiety. 10/29/19   [provider]  carboxymethylcellulose (REFRESH PLUS) 0.5 % SOLN 1 drop 3 (three) times daily as needed.    [provider]  chlorhexidine (PERIDEX) 0.12 % solution SMARTSIG:15 Milliliter(s) By Mouth Morning-Night 12/07/19   [provider]  cyclobenzaprine (FLEXERIL) 10 MG tablet Take 10 mg by mouth at bedtime as needed for spasms. 10/26/19   [provider]  dicyclomine (BENTYL) 10 MG capsule Take 10 mg by mouth 4 (four) times daily as needed (abdominal bloating/spasms.).  10/26/19   [provider]  esomeprazole (NEXIUM) 40 MG capsule Take 1 capsule (40 mg total) by mouth daily before breakfast. 11/18/19   Rehman, Mechele Dawley, MD  famotidine (PEPCID) 20 MG tablet Take 1 tablet (20 mg total) by mouth at bedtime. 11/18/19   Rogene Houston, MD  fluticasone (FLONASE) 50 MCG/ACT nasal  spray Place 1 spray into both nostrils daily for 14 days. 01/25/20 02/08/20  Delante Karapetyan, Darrelyn Hillock, FNP  lidocaine (XYLOCAINE) 2 % solution Use as directed 15 mLs in the mouth or throat as needed for mouth pain. 01/25/20   Kristell Wooding, Darrelyn Hillock, FNP  metoCLOPramide (REGLAN) 10 MG tablet Take 1 tablet (10 mg total) by mouth 2 (two) times daily after a meal. 11/18/19   Rehman, Mechele Dawley, MD  naphazoline-pheniramine (ALLERGY EYE) 0.025-0.3  % ophthalmic solution Place 1-2 drops into both eyes 4 (four) times daily as needed for eye irritation or allergies.    [provider]  omeprazole (PRILOSEC) 40 MG capsule Take 1 capsule (40 mg total) by mouth 2 (two) times daily before a meal. 12/17/19   Minus Liberty, PA-C  PROAIR HFA 108 (90 Base) MCG/ACT inhaler Inhale 2 puffs into the lungs every 4 (four) hours as needed for wheezing or shortness of breath. 11/01/19   [provider]  sertraline (ZOLOFT) 50 MG tablet Take 150 mg by mouth every evening. 10/29/19   [provider]    Family History Family History  Problem Relation Age of Onset  . Colon polyps Mother   . Prostate cancer Father   . Colon cancer Neg Hx     Social History Social History   Tobacco Use  . Smoking status: Current Every Day Smoker    Packs/day: 1.00    Types: Cigarettes  . Smokeless tobacco: Never Used  . Tobacco comment: over 20 yrs.   Substance Use Topics  . Alcohol use: No    Alcohol/week: 0.0 standard drinks  . Drug use: No     Allergies   Amitiza [lubiprostone], Linaclotide, Ceftin [cefuroxime], Codeine, Levaquin [levofloxacin in d5w], and Penicillins   Review of Systems Review of Systems  Constitutional: Negative.   HENT: Positive for ear pain and sore throat.   Respiratory: Negative.   Cardiovascular: Negative.      Physical Exam Triage Vital Signs ED Triage Vitals  Enc Vitals Group     BP 01/25/20 1817 124/87     Pulse Rate 01/25/20 1817 93     Resp 01/25/20 1817 18     Temp 01/25/20 1817 98.2 F (36.8 C)     Temp src --      SpO2 01/25/20 1817 94 %     Weight --      Height --      Head Circumference --      Peak Flow --      Pain Score 01/25/20 1813 7     Pain Loc --      Pain Edu? --      Excl. in [place]? --    No data found.  Updated Vital Signs BP 124/87   Pulse 93   Temp 98.2 F (36.8 C)   Resp 18   SpO2 94%   Visual Acuity Right Eye Distance:   Left Eye Distance:     Bilateral Distance:    Right Eye Near:   Left Eye Near:    Bilateral Near:     Physical Exam Vitals and nursing note reviewed.  Constitutional:      General: She is not in acute distress.    Appearance: Normal appearance. She is well-developed and normal weight. She is not ill-appearing or toxic-appearing.  HENT:     Head: Normocephalic.     Right Ear: Tympanic membrane, ear canal and external ear normal. There is no impacted cerumen.  Left Ear: Tympanic membrane, ear canal and external ear normal. There is no impacted cerumen.     Mouth/Throat:     Tonsils: 2+ on the right. 2+ on the left.  Cardiovascular:     Rate and Rhythm: Normal rate and regular rhythm.     Pulses: Normal pulses.     Heart sounds: Normal heart sounds. No murmur. No gallop.   Pulmonary:     Effort: Pulmonary effort is normal. No respiratory distress.     Breath sounds: Normal breath sounds. No stridor. No wheezing, rhonchi or rales.  Chest:     Chest wall: No tenderness.  Abdominal:     General: Abdomen is flat. Bowel sounds are normal. There is no distension.     Palpations: Abdomen is soft. There is no mass.     Tenderness: There is no abdominal tenderness.     Hernia: No hernia is present.  Neurological:     Mental Status: She is alert.      UC Treatments / Results  Labs (all labs ordered are listed, but only abnormal results are displayed) Labs Reviewed  CULTURE, GROUP A STREP ([place])  NOVEL CORONAVIRUS, NAA  POCT RAPID STREP A (OFFICE)    EKG   Radiology No results found.  Procedures Procedures (including critical care time)  Medications Ordered in UC Medications - No data to display  Initial Impression / Assessment and Plan / UC Course  I have reviewed the triage vital signs and the nursing notes.  Pertinent labs & imaging results that were available during my care of the patient were reviewed by me and considered in my medical decision making (see chart for details).      Patient is stable at discharge. Viscous Lidocaine mouthwash will be prescribed As was prescribed for ear pain POCT strep test and Covid test was completed for rule out Patient was advised to continue to take lansoprazole as it will take a few days to get full benefit of this medication. Follow-up with GI     Final Clinical Impressions(s) / UC Diagnoses   Final diagnoses:  Sore throat     Discharge Instructions     Strep test was negative Continue to ice your face medication as directed Viscous Lidocaine was prescribed Avoid chocolate caffeinated drink alcohol and mint Follow-up with GI specialist for further evaluation and management Return for worsening of symptoms Covid test will take 2 to 7 days for results to return.  Someone will call you if your result is positive.    ED Prescriptions    Medication Sig Dispense Auth. Provider   lidocaine (XYLOCAINE) 2 % solution Use as directed 15 mLs in the mouth or throat as needed for mouth pain. 100 mL Janira Mandell S, FNP   fluticasone (FLONASE) 50 MCG/ACT nasal spray Place 1 spray into both nostrils daily for 14 days. 16 g Emerson Monte, FNP     PDMP not reviewed this encounter.   Emerson Monte, FNP 01/25/20 1903

## 2020-01-25 NOTE — ED Triage Notes (Signed)
Pt has c/o sore throat and ear pain. Pt has h/o reflux and recent endoscopy

## 2020-01-26 LAB — NOVEL CORONAVIRUS, NAA: SARS-CoV-2, NAA: NOT DETECTED

## 2020-03-10 ENCOUNTER — Telehealth (INDEPENDENT_AMBULATORY_CARE_PROVIDER_SITE_OTHER): Payer: Self-pay | Admitting: Internal Medicine

## 2020-03-10 NOTE — Telephone Encounter (Signed)
Patient called stated the medication she has been taking is not working - she has a feeling In her chest like something is stuck there - please advise - ph# [phone]

## 2020-03-16 ENCOUNTER — Encounter (INDEPENDENT_AMBULATORY_CARE_PROVIDER_SITE_OTHER): Payer: Self-pay | Admitting: *Deleted

## 2020-03-16 NOTE — Telephone Encounter (Signed)
Patient has been sent a message through My Chart with Dr.Rehman's recommendation.

## 2020-04-07 ENCOUNTER — Encounter (INDEPENDENT_AMBULATORY_CARE_PROVIDER_SITE_OTHER): Payer: Self-pay

## 2020-04-08 ENCOUNTER — Encounter (INDEPENDENT_AMBULATORY_CARE_PROVIDER_SITE_OTHER): Payer: Self-pay

## 2020-04-11 ENCOUNTER — Telehealth (INDEPENDENT_AMBULATORY_CARE_PROVIDER_SITE_OTHER): Payer: Self-pay | Admitting: Internal Medicine

## 2020-04-11 NOTE — Telephone Encounter (Signed)
Patient left voice mail message stating to cancel her appointment for 5/25  - she also stated she still wants a prescription for pantoprazole 30 mg   -  Please advise - [phone]

## 2020-04-11 NOTE — Telephone Encounter (Signed)
Mitzie--she cannot get any more refills without an office visit.  Please let her know that she needs an office visit before he can do any more or her PCP can refill

## 2020-04-12 ENCOUNTER — Ambulatory Visit (INDEPENDENT_AMBULATORY_CARE_PROVIDER_SITE_OTHER): Payer: 59 | Admitting: Internal Medicine

## 2020-04-12 NOTE — Telephone Encounter (Signed)
Christina Esparza - have you called and made patient aware? If not ,please let her know and she may see Thayer Headings.

## 2020-04-13 ENCOUNTER — Encounter (INDEPENDENT_AMBULATORY_CARE_PROVIDER_SITE_OTHER): Payer: Self-pay

## 2020-04-13 NOTE — Telephone Encounter (Signed)
[facility] by [facility]

## 2020-04-14 NOTE — Telephone Encounter (Signed)
NOTED

## 2020-04-19 ENCOUNTER — Telehealth: Payer: Self-pay | Admitting: Orthopaedic Surgery

## 2020-04-19 NOTE — Telephone Encounter (Signed)
Pt called wanting to know if Dr. Durward Fortes would see her for her Arthritis in her neck? And if so would she be able to see him in the [place] office?   [phone]

## 2020-04-19 NOTE — Telephone Encounter (Signed)
Spoke with patient. Confirmed that Dr.Whitfield will see her for her neck. I gave her the number to the office in [place] for scheduling.

## 2020-04-26 ENCOUNTER — Telehealth (HOSPITAL_COMMUNITY): Payer: Self-pay | Admitting: Psychiatry

## 2020-04-26 NOTE — Telephone Encounter (Signed)
Called to advised of accepted referral - LVM

## 2020-05-04 ENCOUNTER — Encounter: Payer: Self-pay | Admitting: Orthopaedic Surgery

## 2020-05-04 ENCOUNTER — Ambulatory Visit: Payer: Self-pay

## 2020-05-04 ENCOUNTER — Other Ambulatory Visit: Payer: Self-pay

## 2020-05-04 ENCOUNTER — Ambulatory Visit (INDEPENDENT_AMBULATORY_CARE_PROVIDER_SITE_OTHER): Payer: 59 | Admitting: Orthopaedic Surgery

## 2020-05-04 VITALS — Ht 61.0 in | Wt 117.0 lb

## 2020-05-04 DIAGNOSIS — M542 Cervicalgia: Secondary | ICD-10-CM

## 2020-05-04 DIAGNOSIS — M47812 Spondylosis without myelopathy or radiculopathy, cervical region: Secondary | ICD-10-CM | POA: Diagnosis not present

## 2020-05-04 NOTE — Progress Notes (Signed)
Office Visit Note   Patient: Christina Esparza           Date of Birth: 1960/12/07           MRN: [id number] Visit Date: 05/04/2020              Requested by: Sharilyn Sites, [address],  [place] [id number] PCP: Sharilyn Sites, MD   Assessment & Plan: Visit Diagnoses:  1. 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 Badertscher has a chronic history of neck pain.  She has been seen by at least 3 ENT physicians and was told that she did not have any issues relative to her "years".  She has been experiencing some pain in the area of bilateral ears.  For the neck pain she has seen  chiropractor and even tried acupuncture.  She has tried a number of different "rubs" and over-the-counter medicines with little if any relief.  She is not experiencing any upper extremity pain, numbness or tingling.  X-rays revealed diffuse degenerative changes throughout the cervical spine.  Long discussion regarding her diagnosis.  We will obtain MRI scan of cervical spine and have her return shortly thereafter.  She may be a candidate for facet injections  Follow-Up Instructions: Return After MRI cervical spine.   Orders:  Orders Placed This Encounter  Procedures   XR Cervical Spine 2 or 3 views   MR Cervical Spine w/o contrast   No orders of the defined types were placed in this encounter.     Procedures: No procedures performed   Clinical Data: No additional findings.   Subjective: Chief Complaint  Patient presents with   Neck - Pain  Patient presents today for her neck. She states that her neck feels "full" and hurts. Her pain starts behind her ears. 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 She has tried acupuncture, chiropractor, and topical rubs as well as several over-the-counter medicines.  She has had a  second opinion with the second and third ENT with the same diagnosis of neck pain related to the arthritis.  She has not experienced any shoulder or upper extremity pain or numbness or tingling.  No history of injury or trauma. Mrs. Kozub relates that she is under" a lot of personal stress."  She does smoke  HPI  Review of Systems   Objective: Vital Signs: Ht 5\' 1"  (1.549 m)    Wt 117 lb (53.1 kg)    BMI 22.11 kg/m   Physical Exam Constitutional:      Appearance: She is well-developed.  Eyes:     Pupils: Pupils are equal, round, and reactive to light.  Pulmonary:     Effort: Pulmonary effort is normal.  Skin:    General: Skin is warm and dry.  Neurological:     Mental Status: She is alert and oriented to person, place, and time.  Psychiatric:        Behavior: Behavior normal.     Ortho Exam awake alert and oriented x3.  Comfortable sitting.  Was able to touch the chin to the chest.  Had about 70% of normal neck extension.  Had about 60% of normal rotation of her cervical spine to the right and to the left.  Pain was localized to the neck without  any referred pain to either scapula or shoulder.  Good grip and release.  Does have degenerative changes at the base of both thumbs.  Reflexes were symmetrical and normal to both upper extremities.  Motor exam intact some areas of tenderness along the posterior cervical spine.  No masses  Specialty Comments:  No specialty comments available.  Imaging: No results found.   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   Gastroesophageal reflux disease 10/22/2019   GERD (gastroesophageal reflux disease) 10/21/2019   Shortness of breath 10/21/2019   Anxiety state 08/24/2015   Past Medical History:  Diagnosis Date   Anxiety    Depression     Family History  Problem Relation Age of Onset   Colon polyps Mother    Prostate cancer Father    Colon cancer Neg  Hx     Past Surgical History:  Procedure Laterality Date   COLONOSCOPY N/A 06/17/2013   Procedure: COLONOSCOPY;  Surgeon: Rogene Houston, MD;  [facility];  Service: Endoscopy;  Laterality: N/A;  730   ESOPHAGEAL BRUSHING  11/18/2019   Procedure: ESOPHAGEAL BRUSHING;  Surgeon: Rogene Houston, MD;  [facility];  Service: Endoscopy;;   ESOPHAGEAL DILATION N/A 11/15/2015   Procedure: ESOPHAGEAL DILATION;  Surgeon: Rogene Houston, MD;  [facility];  Service: Endoscopy;  Laterality: N/A;   ESOPHAGOGASTRODUODENOSCOPY N/A 11/15/2015   Procedure: ESOPHAGOGASTRODUODENOSCOPY (EGD);  Surgeon: Rogene Houston, MD;  [facility];  Service: Endoscopy;  Laterality: N/A;   ESOPHAGOGASTRODUODENOSCOPY N/A 11/18/2019   Procedure: ESOPHAGOGASTRODUODENOSCOPY (EGD);  Surgeon: Rogene Houston, MD;  [facility];  Service: Endoscopy;  Laterality: N/A;  2:25   NASAL SEPTOPLASTY W/ TURBINOPLASTY Bilateral 11/18/2018   Procedure: NASAL SEPTOPLASTY WITH BILATERAL TURBINATE REDUCTION;  Surgeon: Leta Baptist, MD;  Location: [place];  Service: ENT;  Laterality: Bilateral;   TUBAL LIGATION     Social History   Occupational History   Not on file  Tobacco Use   Smoking status: Current Every Day Smoker    Packs/day: 1.00    Types: Cigarettes   Smokeless tobacco: Never Used   Tobacco comment: over 20 yrs.   Vaping Use   Vaping Use: Never used  Substance and Sexual Activity   Alcohol use: No    Alcohol/week: 0.0 standard drinks   Drug use: No   Sexual activity: Not on file

## 2020-06-03 ENCOUNTER — Ambulatory Visit (HOSPITAL_COMMUNITY): Payer: 59

## 2020-06-09 ENCOUNTER — Encounter (INDEPENDENT_AMBULATORY_CARE_PROVIDER_SITE_OTHER): Payer: Self-pay | Admitting: *Deleted

## 2020-07-08 ENCOUNTER — Ambulatory Visit (INDEPENDENT_AMBULATORY_CARE_PROVIDER_SITE_OTHER): Payer: 59 | Admitting: Allergy & Immunology

## 2020-07-08 ENCOUNTER — Other Ambulatory Visit: Payer: Self-pay

## 2020-07-08 ENCOUNTER — Encounter: Payer: Self-pay | Admitting: Allergy & Immunology

## 2020-07-08 VITALS — BP 110/60 | HR 100 | Temp 98.5°F | Resp 16 | Ht 61.0 in | Wt 115.6 lb

## 2020-07-08 DIAGNOSIS — J3089 Other allergic rhinitis: Secondary | ICD-10-CM | POA: Diagnosis not present

## 2020-07-08 DIAGNOSIS — R0602 Shortness of breath: Secondary | ICD-10-CM

## 2020-07-08 NOTE — Patient Instructions (Signed)
1. SOB (shortness of breath) - Lung testing was initially in the 40-50% range, but we did bump it up to the 50-60% range with the treatments today. - This could be COPD, but I am going to wait and see how you respond to medications.  - I do want you to start Breztri two puffs twice daily (contains three medications to help with your breathing). - I am going to get the breathing test from your PCP to compare these breathing tests to the ones we did today.  - Spacer sample and demonstration provided. - Daily controller medication(s): Breztri two puffs twice daily with spacer - Prior to physical activity: albuterol 2 puffs 10-15 minutes before physical activity. - Rescue medications: albuterol 4 puffs every 4-6 hours as needed - Asthma control goals:  * Full participation in all desired activities (may need albuterol before activity) * Albuterol use two time or less a week on average (not counting use with activity) * Cough interfering with sleep two time or less a month * Oral steroids no more than once a year * No hospitalizations  2. Perennial allergic rhinitis - Testing today showed: cat and dog - Copy of test results provided.  - Get that air purifier fixed in your bedroom.  - Avoidance measures provided. - Continue with: nasal saline rinses twice daily - Start taking: Astelin (azelastine) 2 sprays per nostril 1-2 times daily as needed  - Start taking: Mucinex 1200mg  twice daily with lots of water to help thin mucous - You can use an extra dose of the antihistamine, if needed, for breakthrough symptoms.  - Consider nasal saline rinses 1-2 times daily to remove allergens from the nasal cavities as well as help with mucous clearance (this is especially helpful to do before the nasal sprays are given) - Consider allergy shots as a means of long-term control. - Allergy shots "re-train" and "reset" the immune system to ignore environmental allergens and decrease the resulting immune response  to those allergens (sneezing, itchy watery eyes, runny nose, nasal congestion, etc).    - Allergy shots improve symptoms in 75-85% of patients.  - We can discuss more at the next appointment if the medications are not working for you.  3. No follow-ups on file. This can be an in-person, a virtual Webex or a telephone follow up visit.   Please inform us of any Emergency Department visits, hospitalizations, or changes in symptoms. Call us before going to the ED for breathing or allergy symptoms since we might be able to fit you in for a sick visit. Feel free to contact us anytime with any questions, problems, or concerns.  It was a pleasure to meet you today!  Websites that have reliable patient information: 1. American Academy of Asthma, Allergy, and Immunology: www.aaaai.org 2. Food Allergy Research and Education (FARE): foodallergy.org 3. Mothers of Asthmatics: http://www.asthmacommunitynetwork.org 4. American College of Allergy, Asthma, and Immunology: www.acaai.org   COVID-19 Vaccine Information can be found at: [email or web address] For questions related to vaccine distribution or appointments, please email [email or web address] or call [phone].     "Like" [place] on Facebook and Instagram for our latest updates!        Make sure you are registered to vote! If you have moved or changed any of your contact information, you will need to get this updated before voting!  In some cases, you MAY be able to register to vote online: [email or web address]    Control of Dog or Cat Allergen  Avoidance is the best  way to manage a dog or cat allergy. If you have a dog or cat and are allergic to dog or cats, consider removing the dog or cat from the home. If you have a dog or cat but don't want to find it a new home, or if your family wants a pet even though someone in the household is allergic, here are  some strategies that may help keep symptoms at bay:  1. Keep the pet out of your bedroom and restrict it to only a few rooms. Be advised that keeping the dog or cat in only one room will not limit the allergens to that room. 2. Don't pet, hug or kiss the dog or cat; if you do, wash your hands with soap and water. 3. High-efficiency particulate air (HEPA) cleaners run continuously in a bedroom or living room can reduce allergen levels over time. 4. Regular use of a high-efficiency vacuum cleaner or a central vacuum can reduce allergen levels. 5. Giving your dog or cat a bath at least once a week can reduce airborne allergen.  Allergy Shots   Allergies are the result of a chain reaction that starts in the immune system. Your immune system controls how your body defends itself. For instance, if you have an allergy to pollen, your immune system identifies pollen as an invader or allergen. Your immune system overreacts by producing antibodies called Immunoglobulin E (IgE). These antibodies travel to cells that release chemicals, causing an allergic reaction.  The concept behind allergy immunotherapy, whether it is received in the form of shots or tablets, is that the immune system can be desensitized to specific allergens that trigger allergy symptoms. Although it requires time and patience, the payback can be long-term relief.  How Do Allergy Shots Work?  Allergy shots work much like a vaccine. Your body responds to injected amounts of a particular allergen given in increasing doses, eventually developing a resistance and tolerance to it. Allergy shots can lead to decreased, minimal or no allergy symptoms.  There generally are two phases: build-up and maintenance. Build-up often ranges from three to six months and involves receiving injections with increasing amounts of the allergens. The shots are typically given once or twice a week, though more rapid build-up schedules are sometimes used.  The  maintenance phase begins when the most effective dose is reached. This dose is different for each person, depending on how allergic you are and your response to the build-up injections. Once the maintenance dose is reached, there are longer periods between injections, typically two to four weeks.  Occasionally doctors give cortisone-type shots that can temporarily reduce allergy symptoms. These types of shots are different and should not be confused with allergy immunotherapy shots.  Who Can Be Treated with Allergy Shots?  Allergy shots may be a good treatment approach for people with allergic rhinitis (hay fever), allergic asthma, conjunctivitis (eye allergy) or stinging insect allergy.   Before deciding to begin allergy shots, you should consider:  . The length of allergy season and the severity of your symptoms . Whether medications and/or changes to your environment can control your symptoms . Your desire to avoid long-term medication use . Time: allergy immunotherapy requires a major time commitment . Cost: may vary depending on your insurance coverage  Allergy shots for children age 14 and older are effective and often well tolerated. They might prevent the onset of new allergen sensitivities or the progression to asthma.  Allergy shots are not started on patients who  are pregnant but can be continued on patients who become pregnant while receiving them. In some patients with other medical conditions or who take certain common medications, allergy shots may be of risk. It is important to mention other medications you talk to your allergist.   When Will I Feel Better?  Some may experience decreased allergy symptoms during the build-up phase. For others, it may take as long as 12 months on the maintenance dose. If there is no improvement after a year of maintenance, your allergist will discuss other treatment options with you.  If you aren't responding to allergy shots, it may be  because there is not enough dose of the allergen in your vaccine or there are missing allergens that were not identified during your allergy testing. Other reasons could be that there are high levels of the allergen in your environment or major exposure to non-allergic triggers like tobacco smoke.  What Is the Length of Treatment?  Once the maintenance dose is reached, allergy shots are generally continued for three to five years. The decision to stop should be discussed with your allergist at that time. Some people may experience a permanent reduction of allergy symptoms. Others may relapse and a longer course of allergy shots can be considered.  What Are the Possible Reactions?  The two types of adverse reactions that can occur with allergy shots are local and systemic. Common local reactions include very mild redness and swelling at the injection site, which can happen immediately or several hours after. A systemic reaction, which is less common, affects the entire body or a particular body system. They are usually mild and typically respond quickly to medications. Signs include increased allergy symptoms such as sneezing, a stuffy nose or hives.  Rarely, a serious systemic reaction called anaphylaxis can develop. Symptoms include swelling in the throat, wheezing, a feeling of tightness in the chest, nausea or dizziness. Most serious systemic reactions develop within 30 minutes of allergy shots. This is why it is strongly recommended you wait in your doctor's office for 30 minutes after your injections. Your allergist is trained to watch for reactions, and his or her staff is trained and equipped with the proper medications to identify and treat them.  Who Should Administer Allergy Shots?  The preferred location for receiving shots is your prescribing allergist's office. Injections can sometimes be given at another facility where the physician and staff are trained to recognize and treat reactions,  and have received instructions by your prescribing allergist.

## 2020-07-08 NOTE — Progress Notes (Signed)
NEW PATIENT  Date of Service/Encounter:  07/08/20  Referring provider: Sharilyn Sites, MD   Assessment:   SOB (shortness of breath) - likely COPD  Perennial allergic rhinitis (cat, dog)   Ms. Mandley presents for an allergy evaluation due to chronic congestion, but on further questioning, it seems that she has been on an inhaler for quite some time.  We did do spirometry which showed an FEV1 and FVC in the 50% range.  There was some mild improvement after 2 treatments of Xopenex via metered-dose inhaler.  We are going to start her on a daily controller and see her clinical response.  She did report that a recent breathing test at her primary care provider's office was normal, although I find that difficult to believe.  Regardless, today is just a snapshot in time so we are going to get the outside records to see what they showed.  Testing today did show sensitization to dog and cat only.  She has both of these and in fact she adopted the cat around 2 or 3 months before the severe congestion started.  This could certainly be a trigger for her.  She is going to fix the air purifier in her bedroom to see if this helps.  We are also starting Astelin to see if nasal antihistamine cannot control her symptoms in combination with Mucinex.  She has essentially tried all of the antihistamines without relief.  There are some that she has not tried such as RyVent, but those are more effective for postnasal drip which is not her problem and might exacerbate her dry nose.  Plan/Recommendations:   1. SOB (shortness of breath) - Lung testing was initially in the 40-50% range, but we did bump it up to the 50-60% range with the treatments today. - This could be COPD, but I am going to wait and see how you respond to medications.  - I do want you to start Breztri two puffs twice daily (contains three medications to help with your breathing). - I am going to get the breathing test from your PCP to compare these  breathing tests to the ones we did today.  - Spacer sample and demonstration provided. - Daily controller medication(s): Breztri two puffs twice daily with spacer - Prior to physical activity: albuterol 2 puffs 10-15 minutes before physical activity. - Rescue medications: albuterol 4 puffs every 4-6 hours as needed - Asthma control goals:  * Full participation in all desired activities (may need albuterol before activity) * Albuterol use two time or less a week on average (not counting use with activity) * Cough interfering with sleep two time or less a month * Oral steroids no more than once a year * No hospitalizations  2. Perennial allergic rhinitis - Testing today showed: cat and dog - Copy of test results provided.  - Get that air purifier fixed in your bedroom.  - Avoidance measures provided. - Continue with: nasal saline rinses twice daily - Start taking: Astelin (azelastine) 2 sprays per nostril 1-2 times daily as needed  - Start taking: Mucinex 1200mg  twice daily with lots of water to help thin mucous - You can use an extra dose of the antihistamine, if needed, for breakthrough symptoms.  - Consider nasal saline rinses 1-2 times daily to remove allergens from the nasal cavities as well as help with mucous clearance (this is especially helpful to do before the nasal sprays are given) - Consider allergy shots as a means of long-term control. -  Allergy shots "re-train" and "reset" the immune system to ignore environmental allergens and decrease the resulting immune response to those allergens (sneezing, itchy watery eyes, runny nose, nasal congestion, etc).    - Allergy shots improve symptoms in 75-85% of patients.  - We can discuss more at the next appointment if the medications are not working for you.  3. Follow up in two weeks.  Subjective:   Christina Esparza is a 59 y.o. female presenting today for evaluation of  Chief Complaint  Patient presents with  . Allergies     Michae Kava Petitfrere has a history of the following: Patient Active Problem List   Diagnosis Date Noted  . 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 Gastroesophageal reflux disease 10/22/2019  . GERD (gastroesophageal reflux disease) 10/21/2019  . Shortness of breath 10/21/2019  . Anxiety state 08/24/2015    History obtained from: chart review and patient.  Michae Kava Artley was referred by Sharilyn Sites, MD.     Jirah is a 59 y.o. female presenting for an evaluation of chronic rhinitis.  She has been "around the world" for her congestion to various doctors. She has had problems with a deviated septum and had this surgically corrected. This did not help at all. She reports that she has thick mucous despite nose sprays. She was allergy tested via the blood at some point but she was told that she was not allergic to anything. Symptoms do not seem to change much during the year. It is lately fairly consistent symptom wise.   She has tried various nose sprays including Flonase and others without much improvement. They tend to make her "feel bad". She has tried Claritin without improvement. She has tried "everything", but they make her "tired so that [she] can't go". She feels that there is difficulty getting a "good breath".   She did have an inhaler at one point. She uses this around once or twice per day. She has had it for a "long time". It does not seem to do much at all. She just reports that she has problems catching her breath. She has been sent to have EKGs and endosocopies and echocardiograms. She had a CT chest with contrast but nothing helps.   She has been having some social issues. Her mother was diagnosed with Lewy Body dementia. She is under the care of hospice at this point. Her father was in the hospital for abscesses for 7 weeks and she had a hip replacement. He had his foot amputated as well.   Otherwise, there is no history of other atopic diseases, including food allergies,  drug allergies, stinging insect allergies, eczema, urticaria or contact dermatitis. There is no significant infectious history. Vaccinations are up to date.    Past Medical History: Patient Active Problem List   Diagnosis Date Noted  . 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 Gastroesophageal reflux disease 10/22/2019  . GERD (gastroesophageal reflux disease) 10/21/2019  . Shortness of breath 10/21/2019  . Anxiety state 08/24/2015    Medication List:  Allergies as of 07/08/2020      Reactions   Amitiza [lubiprostone] Itching   Patient states that she experienced a pin sticking sensation, prickling all over.   Linaclotide Nausea Only   Ceftin [cefuroxime] Itching   Codeine Nausea And Vomiting   Levaquin [levofloxacin In D5w]    dream   Penicillins Other (See Comments)   Felt like she was choking Did it involve swelling of the face/tongue/throat, SOB, or low BP? Unknown  Did it involve sudden or severe rash/hives, skin peeling, or any reaction on the inside of your mouth or nose? No Did you need to seek medical attention at a hospital or doctor's office? No When did it last happen?~10 years ago If all above answers are "NO", may proceed with cephalosporin use.      Medication List       Accurate as of July 08, 2020  4:17 PM. If you have any questions, ask your nurse or doctor.        Allergy Eye 0.025-0.3 % ophthalmic solution Generic drug: naphazoline-pheniramine Place 1-2 drops into both eyes 4 (four) times daily as needed for eye irritation or allergies.   ALPRAZolam 1 MG tablet Commonly known as: XANAX Take 1 mg by mouth 3 (three) times daily as needed for anxiety.   buPROPion 100 MG tablet Commonly known as: WELLBUTRIN TAKE 1 TABLET BY MOUTH EVERY DAY FOR 3 DAYS THEN 1 TABLET TWICE DAILY FOR 3 DAYS THEN 1 TABLET THREE TIMES DAILY   carboxymethylcellulose 0.5 % Soln Commonly known as: REFRESH PLUS 1 drop 3 (three) times daily as needed.   chlorhexidine 0.12  % solution Commonly known as: PERIDEX SMARTSIG:15 Milliliter(s) By Mouth Morning-Night   cyclobenzaprine 10 MG tablet Commonly known as: FLEXERIL Take 10 mg by mouth at bedtime as needed for spasms.   dicyclomine 10 MG capsule Commonly known as: BENTYL Take 10 mg by mouth 4 (four) times daily as needed (abdominal bloating/spasms.).   esomeprazole 40 MG capsule Commonly known as: NexIUM Take 1 capsule (40 mg total) by mouth daily before breakfast.   famotidine 20 MG tablet Commonly known as: Pepcid Take 1 tablet (20 mg total) by mouth at bedtime.   fluticasone 50 MCG/ACT nasal spray Commonly known as: FLONASE Place 1 spray into both nostrils daily for 14 days.   lidocaine 2 % solution Commonly known as: XYLOCAINE Use as directed 15 mLs in the mouth or throat as needed for mouth pain.   metoCLOPramide 10 MG tablet Commonly known as: REGLAN Take 1 tablet (10 mg total) by mouth 2 (two) times daily after a meal.   omeprazole 40 MG capsule Commonly known as: PRILOSEC Take 1 capsule (40 mg total) by mouth 2 (two) times daily before a meal.   ProAir HFA 108 (90 Base) MCG/ACT inhaler Generic drug: albuterol Inhale 2 puffs into the lungs every 4 (four) hours as needed for wheezing or shortness of breath.   sertraline 50 MG tablet Commonly known as: ZOLOFT Take 150 mg by mouth every evening.       Birth History: non-contributory  Developmental History: non-contributory  Past Surgical History: Past Surgical History:  Procedure Laterality Date  . COLONOSCOPY N/A 06/17/2013   Procedure: COLONOSCOPY;  Surgeon: Rogene Houston, MD;  [facility];  Service: Endoscopy;  Laterality: N/A;  730  . ESOPHAGEAL BRUSHING  11/18/2019   Procedure: ESOPHAGEAL BRUSHING;  Surgeon: Rogene Houston, MD;  [facility];  Service: Endoscopy;;  . ESOPHAGEAL DILATION N/A 11/15/2015   Procedure: ESOPHAGEAL DILATION;  Surgeon: Rogene Houston, MD;  [facility];   Service: Endoscopy;  Laterality: N/A;  . ESOPHAGOGASTRODUODENOSCOPY N/A 11/15/2015   Procedure: ESOPHAGOGASTRODUODENOSCOPY (EGD);  Surgeon: Rogene Houston, MD;  [facility];  Service: Endoscopy;  Laterality: N/A;  . ESOPHAGOGASTRODUODENOSCOPY N/A 11/18/2019   Procedure: ESOPHAGOGASTRODUODENOSCOPY (EGD);  Surgeon: Rogene Houston, MD;  [facility];  Service: Endoscopy;  Laterality: N/A;  2:25  .  NASAL SEPTOPLASTY W/ TURBINOPLASTY Bilateral 11/18/2018   Procedure: NASAL SEPTOPLASTY WITH BILATERAL TURBINATE REDUCTION;  Surgeon: Leta Baptist, MD;  Location: [place];  Service: ENT;  Laterality: Bilateral;  . TUBAL LIGATION       Family History: Family History  Problem Relation Age of Onset  . Colon polyps Mother   . Prostate cancer Father   . Colon cancer Neg Hx      Social History: Charl lives at home in a mobile home that is 61 years old.  There is carpeting in the bedrooms and wood flooring everywhere else.  They have a heat pump for heating and central cooling.  There is a cat inside of the home and the dog outside of the home.  She does not have dust mite covers on her bedding.  She is a smoker for multiple years, she can remember when she started smoking.  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  She has been there for the past 5 years.   Review of Systems  Constitutional: Negative.  Negative for fever, malaise/fatigue and weight loss.  HENT: Positive for congestion and sinus pain. Negative for ear discharge and ear pain.   Eyes: Negative for pain, discharge and redness.  Respiratory: Positive for cough and shortness of breath. Negative for sputum production and wheezing.   Cardiovascular: Negative.  Negative for chest pain and palpitations.  Gastrointestinal: Negative for abdominal pain, constipation, diarrhea, heartburn, nausea and vomiting.  Skin: Negative.  Negative for itching and rash.  Neurological:  Negative for dizziness and headaches.  Endo/Heme/Allergies: Negative for environmental allergies. Does not bruise/bleed easily.       Objective:   Blood pressure 110/60, pulse 100, temperature 98.5 F (36.9 C), temperature source Oral, resp. rate 16, height 5\' 1"  (1.549 m), weight 115 lb 9.6 oz (52.4 kg), SpO2 97 %. Body mass index is 21.84 kg/m.   Physical Exam:   Physical Exam Constitutional:      Appearance: She is well-developed.     Comments: Very personable. Somewhat anxious.   HENT:     Head: Normocephalic and atraumatic.     Right Ear: Tympanic membrane, ear canal and external ear normal. No drainage, swelling or tenderness. Tympanic membrane is not injected, scarred, erythematous, retracted or bulging.     Left Ear: Tympanic membrane, ear canal and external ear normal. No drainage, swelling or tenderness. Tympanic membrane is not injected, scarred, erythematous, retracted or bulging.     Nose: No nasal deformity, septal deviation, mucosal edema or rhinorrhea.     Right Turbinates: Enlarged and swollen.     Left Turbinates: Enlarged and swollen.     Right Sinus: No maxillary sinus tenderness or frontal sinus tenderness.     Left Sinus: No maxillary sinus tenderness or frontal sinus tenderness.     Mouth/Throat:     Mouth: Mucous membranes are not pale and not dry.     Pharynx: Uvula midline.     Comments: There is some cobblestoning in the posterior oropharynx.  Eyes:     General:        Right eye: No discharge.        Left eye: No discharge.     Conjunctiva/sclera: Conjunctivae normal.     Right eye: Right conjunctiva is not injected. No chemosis.    Left eye: Left conjunctiva is not injected. No chemosis.    Pupils: Pupils are equal, round, and reactive to light.  Cardiovascular:  Rate and Rhythm: Normal rate and regular rhythm.     Heart sounds: Normal heart sounds.  Pulmonary:     Effort: Pulmonary effort is normal. No tachypnea, accessory muscle usage or  respiratory distress.     Breath sounds: Normal breath sounds. No wheezing, rhonchi or rales.     Comments: Decrease air movement at the bases. No over wheezing, but not moving air well at all.  Chest:     Chest wall: No tenderness.  Abdominal:     Tenderness: There is no abdominal tenderness. There is no guarding or rebound.  Lymphadenopathy:     Head:     Right side of head: No submandibular, tonsillar or occipital adenopathy.     Left side of head: No submandibular, tonsillar or occipital adenopathy.     Cervical: No cervical adenopathy.  Skin:    Coloration: Skin is not pale.     Findings: No abrasion, erythema, petechiae or rash. Rash is not papular, urticarial or vesicular.     Comments: No eczematous or urticarial lesions noted.   Neurological:     Mental Status: She is alert.  Psychiatric:        Behavior: Behavior is cooperative.      Diagnostic studies:    Spirometry: results abnormal (FEV1: 1.06/46%, FVC: 1.69/57%, FEV1/FVC: 63%).    Spirometry consistent with mixed obstructive and restrictive disease. Xopenex four puffs via MDI treatment given in clinic with significant improvement in FVC per ATS criteria.  FEV1 increased from 1.06 to 1.16.  The FVC increased from 1.69-1.87.  We went from   Allergy Studies:     Airborne Adult Perc - 07/08/20 1400    Time Antigen Placed 0220    Allergen Manufacturer Lavella Hammock    Location Back    Number of Test 59    1. Control-Buffer 50% Glycerol Negative    2. Control-Histamine 1 mg/ml 2+    3. Albumin saline Negative    4. Tyler Negative    5. [place] Negative    6. Johnson Negative    7. Watch Hill Blue Negative    8. Meadow Fescue Negative    9. Perennial Rye Negative    10. Sweet Vernal Negative    11. Timothy Negative    12. Cocklebur Negative    13. Burweed Marshelder Negative    14. Ragweed, short Negative    15. Ragweed, Giant Negative    16. Plantain,  English Negative    17. Lamb's Quarters Negative    18. Sheep  Sorrell Negative    19. Rough Pigweed Negative    20. Marsh Elder, Rough Negative    21. Mugwort, Common Negative    22. Ash mix Negative    23. Birch mix Negative    24. Beech American Negative    25. Box, Elder Negative    26. Cedar, red Negative    27. Cottonwood, [place] Negative    28. Elm mix Negative    29. Hickory Negative    30. Maple mix Negative    31. Oak, [place] mix Negative    32. Pecan Pollen Negative    33. Pine mix Negative    34. Sycamore Eastern Negative    35. Ackerman, Black Pollen Negative    36. Alternaria alternata Negative    37. Cladosporium Herbarum Negative    38. Aspergillus mix Negative    39. Penicillium mix Negative    40. Bipolaris sorokiniana (Helminthosporium) Negative    41. Drechslera spicifera (Curvularia) Negative  42. Mucor plumbeus Negative    43. Fusarium moniliforme Negative    44. Aureobasidium pullulans (pullulara) Negative    45. Rhizopus oryzae Negative    46. Botrytis cinera Negative    47. Epicoccum nigrum Negative    48. Phoma betae Negative    49. Candida Albicans Negative    50. Trichophyton mentagrophytes Negative    51. Mite, D Farinae  5,000 AU/ml Negative    52. Mite, D Pteronyssinus  5,000 AU/ml Negative    53. Cat Hair 10,000 BAU/ml Negative    54.  Dog Epithelia Negative    55. Mixed Feathers Negative    56. Horse Epithelia Negative    57. Cockroach, German Negative    58. Mouse Negative    59. Tobacco Leaf Negative          Intradermal - 07/08/20 1438    Time Antigen Placed 1438    Allergen Manufacturer Lavella Hammock    Location Arm    Number of Test 15    Intradermal Select    Control Negative    Gu[place] Negative    Johnson Negative    7 Grass Negative    Ragweed mix Negative    Weed mix Negative    Tree mix Negative    Mold 1 Negative    Mold 2 Negative    Mold 3 Negative    Mold 4 Negative    Cat 3+    Dog 3+    Cockroach Negative    Mite mix Negative           Allergy testing results were  read and interpreted by myself, documented by clinical staff.         Salvatore Marvel, MD [facility] [place] [facility]

## 2020-07-10 ENCOUNTER — Encounter: Payer: Self-pay | Admitting: Allergy & Immunology

## 2020-07-20 ENCOUNTER — Ambulatory Visit (HOSPITAL_COMMUNITY)
Admission: RE | Admit: 2020-07-20 | Discharge: 2020-07-20 | Disposition: A | Discharge status: Home / Self Care | Payer: 59 | Source: Ambulatory Visit | Attending: Orthopaedic Surgery | Admitting: Orthopaedic Surgery

## 2020-07-20 ENCOUNTER — Other Ambulatory Visit: Payer: Self-pay

## 2020-07-20 DIAGNOSIS — M542 Cervicalgia: Secondary | ICD-10-CM | POA: Diagnosis not present

## 2020-07-26 NOTE — Patient Instructions (Addendum)
Shortness of breath Continue Breztri 2 inhalations twice a day with spacer to help prevent cough and wheeze. Make sure you rinse your mouth out afterwards May use albuterol 2 puffs every 4 hours as needed for cough, wheeze, tightness in chest, or shortness of breath.  Perennial allergic rhinitis (skin testing positive on 07/08/2020-cat/ dog) Continue avoidance measures to cat and dog Start nasal saline rinses twice a day as needed for nasal symptoms Start azelastine nasal spray 2 sprays each nostril once or twice a day as needed for runny nose. Continue Mucinex 1200 mg twice a day as needed. Drink with plenty of water to help thin mucous  Smoking cessation Try cutting down on your smoking.    If your throat continues to be sore please schedule an appointment with your primary care physician  Please let us know if this treatment plan is not working well for you. Schedule follow up appointment in 1 month

## 2020-07-27 ENCOUNTER — Ambulatory Visit (INDEPENDENT_AMBULATORY_CARE_PROVIDER_SITE_OTHER): Payer: 59 | Admitting: Family

## 2020-07-27 ENCOUNTER — Other Ambulatory Visit: Payer: Self-pay

## 2020-07-27 ENCOUNTER — Encounter: Payer: Self-pay | Admitting: Family

## 2020-07-27 VITALS — BP 130/70 | HR 101 | Temp 98.3°F | Resp 18

## 2020-07-27 DIAGNOSIS — R0602 Shortness of breath: Secondary | ICD-10-CM | POA: Diagnosis not present

## 2020-07-27 DIAGNOSIS — J3089 Other allergic rhinitis: Secondary | ICD-10-CM | POA: Diagnosis not present

## 2020-07-27 MED ORDER — BREZTRI AEROSPHERE 160-9-4.8 MCG/ACT IN AERO: 2.0000 | INHALATION_SPRAY | Freq: Two times a day (BID) | RESPIRATORY_TRACT | 5 refills | Status: DC

## 2020-07-27 NOTE — Progress Notes (Signed)
[address], [address] [place] [address] Dept: [phone]  FOLLOW UP NOTE  Patient ID: Christina Esparza, female    DOB: [birth year]  Age: 59 y.o. MRN: [id number] Date of Office Visit: 07/27/2020  Assessment  Chief Complaint: Follow-up  HPI Christina Esparza is a 59 year old female who presents for follow-up of shortness of breath and perennial allergic rhinitis.  She was last seen on July 16, 2020 by Dr. Ernst Bowler.  Shortness of breath is reported as feelings somewhat better with the use of Breztri 2 puffs twice a day with a spacer and albuterol as needed.  She reports that she was not certain if she was using her inhaler with the spacer correctly.  She also did not use the albuterol any due to not being certain if she could use  that along with Breztri.  She reports a cough that is productive with maybe a thick yellowish color.  She is uncertain of the color of her sputum.  She also reports some shortness of breath, but is not aware if this is with rest or exertion.  She also reports feeling shortness of breath when real nervous.  She denies any wheezing, nocturnal awakenings, tightness in her chest fever, or chills.  She has not required any systemic steroids since her last office visit due to breathing problems and has not made any trips to the emergency room or urgent care due to breathing problems.  She continues to smoke a pack and a half of cigarettes a day.  She has been smoking since the age of 59 years old.  Perennial allergic rhinitis is reported as not well controlled with saline nasal spray and Mucinex.  She did not pick up the azelastine nasal spray that was sent at the last office visit.  She reports postnasal drip, hoarseness, nasal congestion, and dryness in her nose and throat and denies rhinorrhea.  She does mention that she can breathe through her nose better now.  She also reports that her throat and nose feel dry like sandpaper and that she hurts from her ears down  to her neck, but reports that her throat is not sore.  She has been to see ear nose and throat due to this ear pain and fullness and was told that it was due to arthritis.  She is also seeing an orthopedic doctor and had an MRI and was also told that she had arthritis in her neck.  Current medications are as listed in her chart.   Drug Allergies:  Allergies  Allergen Reactions  . Amitiza [Lubiprostone] Itching    Patient states that she experienced a pin sticking sensation, prickling all over.  . Linaclotide Nausea Only  . Ceftin [Cefuroxime] Itching  . Codeine Nausea And Vomiting  . Levaquin [Levofloxacin In D5w]     dream  . Penicillins Other (See Comments)    Felt like she was choking Did it involve swelling of the face/tongue/throat, SOB, or low BP? Unknown Did it involve sudden or severe rash/hives, skin peeling, or any reaction on the inside of your mouth or nose? No Did you need to seek medical attention at a hospital or doctor's office? No When did it last happen?~10 years ago If all above answers are "NO", may proceed with cephalosporin use.     Review of Systems: Review of Systems  Constitutional: Negative for chills and fever.  HENT: Positive for congestion.        Reports post nasal drip and denies rhinorrhea  Eyes:       Denies itchy watery eyes  Respiratory: Positive for cough, sputum production and shortness of breath. Negative for wheezing.   Cardiovascular: Negative for chest pain and palpitations.  Gastrointestinal: Negative for abdominal pain.  Genitourinary: Negative for dysuria.  Skin: Negative for itching and rash.  Neurological: Negative for headaches.  Endo/Heme/Allergies: Positive for environmental allergies.    Physical Exam: BP 130/70   Pulse (!) 101   Temp 98.3 F (36.8 C) (Temporal)   Resp 18   SpO2 95%    Physical Exam Constitutional:      Appearance: Normal appearance.  HENT:     Head: Normocephalic and atraumatic.     Comments:  Pharynx normal. Eyes normal. Ears normal. Nose normal. No palpable cervical lymphnodes    Right Ear: Tympanic membrane, ear canal and external ear normal.     Left Ear: Tympanic membrane, ear canal and external ear normal.     Nose: Nose normal.     Mouth/Throat:     Mouth: Mucous membranes are moist.     Pharynx: Oropharynx is clear.  Eyes:     Conjunctiva/sclera: Conjunctivae normal.  Cardiovascular:     Rate and Rhythm: Regular rhythm.     Heart sounds: Normal heart sounds.  Pulmonary:     Effort: Pulmonary effort is normal.     Breath sounds: Normal breath sounds.     Comments: Lungs clear to auscultation Musculoskeletal:     Cervical back: Neck supple.  Skin:    General: Skin is warm.  Neurological:     Mental Status: She is alert and oriented to person, place, and time.  Psychiatric:        Mood and Affect: Mood normal.        Behavior: Behavior normal.        Thought Content: Thought content normal.        Judgment: Judgment normal.     Diagnostics: FVC 2.35 L, FEV1 1.50 L.  Predicted FVC 2.95 L, FEV1 2.28 L.  Spirometry indicates mild airway obstruction.  Post bronchodilator response shows FVC 2.36 L, FEV1 1.54 L.  Spirometry indicates mild airway obstruction with no significant bronchodilator response.  Assessment and Plan: 1. SOB (shortness of breath)   2. Perennial allergic rhinitis     Meds ordered this encounter  Medications  . Budeson-Glycopyrrol-Formoterol (BREZTRI AEROSPHERE) 160-9-4.8 MCG/ACT AERO    Sig: Inhale 2 puffs into the lungs in the morning and at bedtime. With spacer.    Dispense:  10.7 g    Refill:  5    Patient Instructions  Shortness of breath Continue Breztri 2 inhalations twice a day with spacer to help prevent cough and wheeze. Make sure you rinse your mouth out afterwards May use albuterol 2 puffs every 4 hours as needed for cough, wheeze, tightness in chest, or shortness of breath.  Perennial allergic rhinitis (skin testing  positive on 07/08/2020-cat/ dog) Continue avoidance measures to cat and dog Start nasal saline rinses twice a day as needed for nasal symptoms Start azelastine nasal spray 2 sprays each nostril once or twice a day as needed for runny nose. Continue Mucinex 1200 mg twice a day as needed. Drink with plenty of water to help thin mucous  Smoking cessation Try cutting down on your smoking.    If your throat continues to be sore please schedule an appointment with your primary care physician  Please let us know if this treatment plan is not working well for  you. Schedule follow up appointment in 1 month   Return in about 4 weeks (around 08/24/2020), or if symptoms worsen or fail to improve.    Thank you for the opportunity to care for this patient.  Please do not hesitate to contact me with questions.  Althea Charon, FNP [facility]

## 2020-08-24 ENCOUNTER — Ambulatory Visit (INDEPENDENT_AMBULATORY_CARE_PROVIDER_SITE_OTHER): Payer: 59 | Admitting: Orthopaedic Surgery

## 2020-08-24 ENCOUNTER — Encounter: Payer: Self-pay | Admitting: Orthopaedic Surgery

## 2020-08-24 ENCOUNTER — Other Ambulatory Visit: Payer: Self-pay

## 2020-08-24 DIAGNOSIS — M47812 Spondylosis without myelopathy or radiculopathy, cervical region: Secondary | ICD-10-CM | POA: Diagnosis not present

## 2020-08-24 NOTE — Progress Notes (Signed)
Office Visit Note   Patient: Christina Esparza           Date of Birth: [birth year]           MRN: [id number] Visit Date: 08/24/2020              Requested by: Sharilyn Sites, [address] [place],  [place] [address] PCP: Sharilyn Sites, MD   Assessment & Plan: Visit Diagnoses:  1. Spondylosis of cervical region without myelopathy or radiculopathy     Plan: This is Mago had an MRI scan of her cervical spine.  The scan demonstrates multilevel degenerative changes with prominent facet degeneration at multiple levels as well.  There was mild foraminal narrowing bilaterally at C3-4 and moderate foraminal narrowing bilaterally at C4-5 due to facet degeneration and spurring.  She is not experiencing any referred pain to either upper extremity but has had some ear pain chronically.  Long discussion regarding the above.  I like to set up a course of physical therapy at [facility] as she lives in [place].  Also asked Dr. Ernestina Patches to evaluate for either an epidural steroid injection or facet blocks in the cervical spine.  Follow-Up Instructions: Return After neck injections.   Orders:  Orders Placed This Encounter  Procedures  . Ambulatory referral to Physical Medicine Rehab  . Ambulatory referral to Physical Therapy   No orders of the defined types were placed in this encounter.     Procedures: No procedures performed   Clinical Data: No additional findings.   Subjective: Chief Complaint  Patient presents with  . Neck - Follow-up    MRI review  Patient presents today for follow up on her neck pain. She had an MRI of her C-Spine on 07/20/2020 and is here today for those results. She states that her neck constantly aches, and it feels like her ears are "stopped up". HPI  Review of Systems   Objective: Vital Signs: There were no vitals taken for this visit.  Physical Exam Constitutional:      Appearance: She is well-developed.  Eyes:     Pupils: Pupils are equal,  round, and reactive to light.  Pulmonary:     Effort: Pulmonary effort is normal.  Skin:    General: Skin is warm and dry.  Neurological:     Mental Status: She is alert and oriented to person, place, and time.  Psychiatric:        Behavior: Behavior normal.     Ortho Exam awake alert and oriented x3.  Comfortable sitting.  Does have limited range of motion of the cervical spine.  She is barely able to touch her chin to her chest.  She only has about 50% of normal neck extension but no referred pain to either upper extremity.  She does lack about 40 degrees of rotation of the right and to the left but again no referred pain to either upper extremity.  Reflexes are symmetrical.  Good grip and release.  Does have some areas of trigger point tenderness around the cervical spine  Specialty Comments:  No specialty comments available.  Imaging: No results found.   PMFS History: Patient Active Problem List   Diagnosis Date Noted  . 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 Gastroesophageal reflux disease 10/22/2019  . GERD (gastroesophageal reflux disease) 10/21/2019  . Shortness of breath 10/21/2019  . Anxiety state 08/24/2015   Past Medical History:  Diagnosis Date  . Anxiety   . Depression  Family History  Problem Relation Age of Onset  . Colon polyps Mother   . Prostate cancer Father   . Colon cancer Neg Hx     Past Surgical History:  Procedure Laterality Date  . COLONOSCOPY N/A 06/17/2013   Procedure: COLONOSCOPY;  Surgeon: Rogene Houston, MD;  [facility];  Service: Endoscopy;  Laterality: N/A;  730  . ESOPHAGEAL BRUSHING  11/18/2019   Procedure: ESOPHAGEAL BRUSHING;  Surgeon: Rogene Houston, MD;  [facility];  Service: Endoscopy;;  . ESOPHAGEAL DILATION N/A 11/15/2015   Procedure: ESOPHAGEAL DILATION;  Surgeon: Rogene Houston, MD;  [facility];  Service: Endoscopy;  Laterality: N/A;  . ESOPHAGOGASTRODUODENOSCOPY N/A  11/15/2015   Procedure: ESOPHAGOGASTRODUODENOSCOPY (EGD);  Surgeon: Rogene Houston, MD;  [facility];  Service: Endoscopy;  Laterality: N/A;  . ESOPHAGOGASTRODUODENOSCOPY N/A 11/18/2019   Procedure: ESOPHAGOGASTRODUODENOSCOPY (EGD);  Surgeon: Rogene Houston, MD;  [facility];  Service: Endoscopy;  Laterality: N/A;  2:25  . NASAL SEPTOPLASTY W/ TURBINOPLASTY Bilateral 11/18/2018   Procedure: NASAL SEPTOPLASTY WITH BILATERAL TURBINATE REDUCTION;  Surgeon: Leta Baptist, MD;  Location: [facility];  Service: ENT;  Laterality: Bilateral;  . TUBAL LIGATION     Social History   Occupational History  . Not on file  Tobacco Use  . Smoking status: Current Every Day Smoker    Packs/day: 1.00    Types: Cigarettes  . Smokeless tobacco: Never Used  . Tobacco comment: over 20 yrs.   Vaping Use  . Vaping Use: Never used  Substance and Sexual Activity  . Alcohol use: No    Alcohol/week: 0.0 standard drinks  . Drug use: No  . Sexual activity: Not on file

## 2020-08-26 ENCOUNTER — Telehealth: Payer: Self-pay

## 2020-08-26 ENCOUNTER — Other Ambulatory Visit: Payer: Self-pay

## 2020-08-26 ENCOUNTER — Ambulatory Visit (INDEPENDENT_AMBULATORY_CARE_PROVIDER_SITE_OTHER): Payer: 59 | Admitting: Allergy & Immunology

## 2020-08-26 ENCOUNTER — Encounter: Payer: Self-pay | Admitting: Allergy & Immunology

## 2020-08-26 VITALS — BP 118/70 | HR 90 | Temp 97.4°F | Resp 16

## 2020-08-26 DIAGNOSIS — J3089 Other allergic rhinitis: Secondary | ICD-10-CM | POA: Diagnosis not present

## 2020-08-26 DIAGNOSIS — J449 Chronic obstructive pulmonary disease, unspecified: Secondary | ICD-10-CM | POA: Diagnosis not present

## 2020-08-26 MED ORDER — AZELASTINE HCL 0.1 % NA SOLN: 1.0000 | Freq: Two times a day (BID) | NASAL | 11 refills | Status: DC

## 2020-08-26 NOTE — Progress Notes (Signed)
FOLLOW UP  Date of Service/Encounter:  08/26/20   Assessment:   SOB (shortness of breath) - likely COPD but not excited about seeing Pulmonology  Perennial allergic rhinitis (cat, dog)   Plan/Recommendations:   1. COPD with asthma overlap syndrome - Lung testing looked stable today. - We are not going to make any medication changes at this time.   - We are going to get some labs to check for causes of difficult to control asthma.  - Daily controller medication(s): Breztri two puffs twice daily with spacer - Prior to physical activity: albuterol 2 puffs 10-15 minutes before physical activity. - Rescue medications: albuterol 4 puffs every 4-6 hours as needed - Asthma control goals:  * Full participation in all desired activities (may need albuterol before activity) * Albuterol use two time or less a week on average (not counting use with activity) * Cough interfering with sleep two time or less a month * Oral steroids no more than once a year * No hospitalizations  2. Perennial allergic rhinitis (cat, dog) - Continue with: nasal saline rinses twice daily - Continue taking: Astelin (azelastine) 2 sprays per nostril 1-2 times daily as needed  - Consider allergy shots. - Information provided.   3. Return in about 3 months (around 11/26/2020).   Subjective:   Christina Esparza is a 59 y.o. female presenting today for follow up of  Chief Complaint  Patient presents with  . Shortness of Breath    Christina Esparza has a history of the following: Patient Active Problem List   Diagnosis Date Noted  . 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 Gastroesophageal reflux disease 10/22/2019  . GERD (gastroesophageal reflux disease) 10/21/2019  . Shortness of breath 10/21/2019  . Anxiety state 08/24/2015    History obtained from: chart review and patient.  Christina Esparza is a 59 y.o. female presenting for a follow up visit.  She was last seen in September 2021.  At that time, she was  continued on Breztri 2 puffs twice daily with a spacer as well as albuterol as needed.  For her allergic rhinitis, she was started on nasal saline rinses as well as Astelin.  She was also continued on Mucinex twice daily.  Smoking cessation was discussed.  Since last visit, she has had some stressful times. Her mother passed at the beginning of September. Her father had to have both legs amputated. He was brought home last night. He does have 24/7 nursing care. There were some caregivers for her mother and they are staying on to help. She does have two sisters (she is the middle child) who are also providing some support, although Christina Esparza did the majority of the caregiving.  Asthma/Respiratory Symptom History: She does feel thta when she uses the [place], her breathing gets better. She does point to her lower throat. She feels that there is some soreness in her lower throat and she does have some mucous production. She is smoking  Allergic Rhinitis Symptom History: She was only using the nasal saline. She tells me that she never got the Astelin prescription. I checked and there was no prescription put in there. I did send it in today. She has been using the Mucinex.   Otherwise, there have been no changes to her past medical history, surgical history, family history, or social history.    Review of Systems  Constitutional: Negative.  Negative for chills, fever, malaise/fatigue and weight loss.  HENT: Negative for congestion, ear discharge, ear pain and  sinus pain.   Eyes: Negative for pain, discharge and redness.  Respiratory: Positive for cough. Negative for sputum production, shortness of breath and wheezing.   Cardiovascular: Negative.  Negative for chest pain and palpitations.  Gastrointestinal: Negative for abdominal pain, constipation, diarrhea, heartburn, nausea and vomiting.  Skin: Negative.  Negative for itching and rash.  Neurological: Negative for dizziness and headaches.    Endo/Heme/Allergies: Negative for environmental allergies. Does not bruise/bleed easily.       Objective:   Blood pressure 118/70, pulse 90, temperature (!) 97.4 F (36.3 C), temperature source Other (Comment), resp. rate 16, SpO2 99 %. There is no height or weight on file to calculate BMI.   Physical Exam:  Physical Exam Constitutional:      Appearance: She is well-developed.     Comments: Seems somewhat down today.  HENT:     Head: Normocephalic and atraumatic.     Right Ear: Tympanic membrane, ear canal and external ear normal.     Left Ear: Tympanic membrane and ear canal normal.     Nose: No nasal deformity, septal deviation, mucosal edema or rhinorrhea.     Right Sinus: No maxillary sinus tenderness or frontal sinus tenderness.     Left Sinus: No maxillary sinus tenderness or frontal sinus tenderness.     Mouth/Throat:     Mouth: Mucous membranes are not pale and not dry.     Pharynx: Uvula midline.  Eyes:     General:        Right eye: No discharge.        Left eye: No discharge.     Conjunctiva/sclera: Conjunctivae normal.     Right eye: Right conjunctiva is not injected. No chemosis.    Left eye: Left conjunctiva is not injected. No chemosis.    Pupils: Pupils are equal, round, and reactive to light.  Cardiovascular:     Rate and Rhythm: Normal rate and regular rhythm.     Heart sounds: Normal heart sounds.  Pulmonary:     Effort: Pulmonary effort is normal. No tachypnea, accessory muscle usage or respiratory distress.     Breath sounds: Normal breath sounds. No wheezing, rhonchi or rales.  Chest:     Chest wall: No tenderness.  Lymphadenopathy:     Cervical: No cervical adenopathy.  Skin:    Coloration: Skin is not pale.     Findings: No abrasion, erythema, petechiae or rash. Rash is not papular, urticarial or vesicular.  Neurological:     Mental Status: She is alert.  Psychiatric:        Behavior: Behavior is cooperative.      Diagnostic studies:     Spirometry: largely unchanged from her last spirometry, but certainly better than her first one. Her FEV1 was in the high 50% range. Christina Esparza scanned into the system.        Christina Marvel, MD  [facility]

## 2020-08-26 NOTE — Patient Instructions (Addendum)
1. COPD with asthma overlap syndrome - Lung testing looked stable today. - We are not going to make any medication changes at this time.   - We are going to get some labs to check for causes of difficult to control asthma.  - Daily controller medication(s): Breztri two puffs twice daily with spacer - Prior to physical activity: albuterol 2 puffs 10-15 minutes before physical activity. - Rescue medications: albuterol 4 puffs every 4-6 hours as needed - Asthma control goals:  * Full participation in all desired activities (may need albuterol before activity) * Albuterol use two time or less a week on average (not counting use with activity) * Cough interfering with sleep two time or less a month * Oral steroids no more than once a year * No hospitalizations  2. Perennial allergic rhinitis (cat, dog) - Continue with: nasal saline rinses twice daily - Continue taking: Astelin (azelastine) 2 sprays per nostril 1-2 times daily as needed  - Consider allergy shots. - Information provided.   3. Return in about 3 months (around 11/26/2020).    Please inform us of any Emergency Department visits, hospitalizations, or changes in symptoms. Call us before going to the ED for breathing or allergy symptoms since we might be able to fit you in for a sick visit. Feel free to contact us anytime with any questions, problems, or concerns.  It was a pleasure to see you again today!  Websites that have reliable patient information: 1. American Academy of Asthma, Allergy, and Immunology: www.aaaai.org 2. Food Allergy Research and Education (FARE): foodallergy.org 3. Mothers of Asthmatics: http://www.asthmacommunitynetwork.org 4. American College of Allergy, Asthma, and Immunology: www.acaai.org   COVID-19 Vaccine Information can be found at: ShippingScam.co.uk For questions related to vaccine distribution or appointments, please email  [email or web address] or call [phone].     "Like" [place] on Facebook and Instagram for our latest updates!     HAPPY FALL!     Make sure you are registered to vote! If you have moved or changed any of your contact information, you will need to get this updated before voting!  In some cases, you MAY be able to register to vote online: [email or web address]

## 2020-08-26 NOTE — Telephone Encounter (Signed)
Patient called in retuning missed call

## 2020-08-28 ENCOUNTER — Ambulatory Visit (HOSPITAL_COMMUNITY)
Admission: RE | Admit: 2020-08-28 | Discharge: 2020-08-28 | Disposition: A | Discharge status: Home / Self Care | Payer: 59 | Source: Ambulatory Visit | Attending: Emergency Medicine | Admitting: Emergency Medicine

## 2020-08-28 ENCOUNTER — Other Ambulatory Visit: Payer: Self-pay

## 2020-08-28 ENCOUNTER — Encounter: Payer: Self-pay | Admitting: Allergy & Immunology

## 2020-08-28 ENCOUNTER — Ambulatory Visit
Admission: EM | Admit: 2020-08-28 | Discharge: 2020-08-28 | Disposition: A | Discharge status: Home / Self Care | Payer: 59 | Attending: Emergency Medicine | Admitting: Emergency Medicine

## 2020-08-28 ENCOUNTER — Encounter: Payer: Self-pay | Admitting: Emergency Medicine

## 2020-08-28 DIAGNOSIS — M19042 Primary osteoarthritis, left hand: Secondary | ICD-10-CM | POA: Diagnosis not present

## 2020-08-28 DIAGNOSIS — M25742 Osteophyte, left hand: Secondary | ICD-10-CM | POA: Insufficient documentation

## 2020-08-28 DIAGNOSIS — M79645 Pain in left finger(s): Secondary | ICD-10-CM

## 2020-08-28 DIAGNOSIS — S52692A Other fracture of lower end of left ulna, initial encounter for closed fracture: Secondary | ICD-10-CM | POA: Diagnosis not present

## 2020-08-28 DIAGNOSIS — S6992XA Unspecified injury of left wrist, hand and finger(s), initial encounter: Secondary | ICD-10-CM | POA: Diagnosis not present

## 2020-08-28 DIAGNOSIS — X58XXXA Exposure to other specified factors, initial encounter: Secondary | ICD-10-CM | POA: Insufficient documentation

## 2020-08-28 NOTE — ED Provider Notes (Signed)
[place]   [id number] 08/28/20 Arrival Time: 62   Chief Complaint  Patient presents with  . Finger Injury     SUBJECTIVE: History from: patient.  JANIYLAH HANNIS is a 59 y.o. female who presented to the urgent care with a complaint of left middle finger injury that occurred yesterday.  Report a cedar chest fell on her hand.  She localizes the pain to the left middle finger.  She describes the pain as constant and achy.  She has tried OTC medications without relief.  Her symptoms are made worse with ROM.  She denies similar symptoms in the past.  Denies chills, fever, nausea, vomiting, diarrhea   ROS: As per HPI.  All other pertinent ROS negative.     Past Medical History:  Diagnosis Date  . Anxiety   . Depression    Past Surgical History:  Procedure Laterality Date  . COLONOSCOPY N/A 06/17/2013   Procedure: COLONOSCOPY;  Surgeon: Rogene Houston, MD;  [facility];  Service: Endoscopy;  Laterality: N/A;  730  . ESOPHAGEAL BRUSHING  11/18/2019   Procedure: ESOPHAGEAL BRUSHING;  Surgeon: Rogene Houston, MD;  [facility];  Service: Endoscopy;;  . ESOPHAGEAL DILATION N/A 11/15/2015   Procedure: ESOPHAGEAL DILATION;  Surgeon: Rogene Houston, MD;  [facility];  Service: Endoscopy;  Laterality: N/A;  . ESOPHAGOGASTRODUODENOSCOPY N/A 11/15/2015   Procedure: ESOPHAGOGASTRODUODENOSCOPY (EGD);  Surgeon: Rogene Houston, MD;  [facility];  Service: Endoscopy;  Laterality: N/A;  . ESOPHAGOGASTRODUODENOSCOPY N/A 11/18/2019   Procedure: ESOPHAGOGASTRODUODENOSCOPY (EGD);  Surgeon: Rogene Houston, MD;  [facility];  Service: Endoscopy;  Laterality: N/A;  2:25  . NASAL SEPTOPLASTY W/ TURBINOPLASTY Bilateral 11/18/2018   Procedure: NASAL SEPTOPLASTY WITH BILATERAL TURBINATE REDUCTION;  Surgeon: Leta Baptist, MD;  Location: [place];  Service: ENT;  Laterality: Bilateral;  . TUBAL LIGATION     Allergies    Allergen Reactions  . Amitiza [Lubiprostone] Itching    Patient states that she experienced a pin sticking sensation, prickling all over.  . Linaclotide Nausea Only  . Ceftin [Cefuroxime] Itching  . Codeine Nausea And Vomiting  . Levaquin [Levofloxacin In D5w]     dream  . Penicillins Other (See Comments)    Felt like she was choking Did it involve swelling of the face/tongue/throat, SOB, or low BP? Unknown Did it involve sudden or severe rash/hives, skin peeling, or any reaction on the inside of your mouth or nose? No Did you need to seek medical attention at a hospital or doctor's office? No When did it last happen?~10 years ago If all above answers are "NO", may proceed with cephalosporin use.    No current facility-administered medications on file prior to encounter.   Current Outpatient Medications on File Prior to Encounter  Medication Sig Dispense Refill  . ALPRAZolam (XANAX) 1 MG tablet Take 1 mg by mouth 3 (three) times daily as needed for anxiety.    Marland Kitchen azelastine (ASTELIN) 0.1 % nasal spray Place 1 spray into both nostrils 2 (two) times daily. Use in each nostril as directed 30 mL 11  . Budeson-Glycopyrrol-Formoterol (BREZTRI AEROSPHERE) 160-9-4.8 MCG/ACT AERO Inhale 2 puffs into the lungs in the morning and at bedtime. With spacer. 10.7 g 5  . buPROPion (WELLBUTRIN) 100 MG tablet TAKE 1 TABLET BY MOUTH EVERY DAY FOR 3 DAYS THEN 1 TABLET TWICE DAILY FOR 3 DAYS THEN 1 TABLET THREE TIMES DAILY (Patient not taking:  Reported on 08/26/2020)    . cyclobenzaprine (FLEXERIL) 10 MG tablet Take 10 mg by mouth at bedtime as needed for spasms.    Marland Kitchen omeprazole (PRILOSEC) 40 MG capsule Take 1 capsule (40 mg total) by mouth 2 (two) times daily before a meal. (Patient not taking: Reported on 08/26/2020) 90 capsule 3  . PROAIR HFA 108 (90 Base) MCG/ACT inhaler Inhale 2 puffs into the lungs every 4 (four) hours as needed for wheezing or shortness of breath.    . sertraline (ZOLOFT) 50 MG tablet  Take 150 mg by mouth every evening.     Social History   Socioeconomic History  . Marital status: Married    Spouse name: Not on file  . Number of children: Not on file  . Years of education: Not on file  . Highest education level: Not on file  Occupational History  . Not on file  Tobacco Use  . Smoking status: Current Every Day Smoker    Packs/day: 1.00    Types: Cigarettes  . Smokeless tobacco: Never Used  . Tobacco comment: over 20 yrs.   Vaping Use  . Vaping Use: Never used  Substance and Sexual Activity  . Alcohol use: No    Alcohol/week: 0.0 standard drinks  . Drug use: No  . Sexual activity: Not on file  Other Topics Concern  . Not on file  Social History Narrative  . Not on file   Social Determinants of Health   Financial Resource Strain:   . Difficulty of Paying Living Expenses: Not on file  Food Insecurity:   . Worried About Charity fundraiser in the Last Year: Not on file  . Ran Out of Food in the Last Year: Not on file  Transportation Needs:   . Lack of Transportation (Medical): Not on file  . Lack of Transportation (Non-Medical): Not on file  Physical Activity:   . Days of Exercise per Week: Not on file  . Minutes of Exercise per Session: Not on file  Stress:   . Feeling of Stress : Not on file  Social Connections:   . Frequency of Communication with Friends and Family: Not on file  . Frequency of Social Gatherings with Friends and Family: Not on file  . Attends Religious Services: Not on file  . Active Member of Clubs or Organizations: Not on file  . Attends Archivist Meetings: Not on file  . Marital Status: Not on file  Intimate Partner Violence:   . Fear of Current or Ex-Partner: Not on file  . Emotionally Abused: Not on file  . Physically Abused: Not on file  . Sexually Abused: Not on file   Family History  Problem Relation Age of Onset  . Colon polyps Mother   . Prostate cancer Father   . Colon cancer Neg Hx      OBJECTIVE:  Vitals:   08/28/20 1144  BP: 106/68  Pulse: (!) 102  Resp: 16  Temp: 98.3 F (36.8 C)  TempSrc: Oral  SpO2: 95%     Physical Exam Vitals and nursing note reviewed.  Constitutional:      General: She is not in acute distress.    Appearance: Normal appearance. She is normal weight. She is not ill-appearing, toxic-appearing or diaphoretic.  HENT:     Head: Normocephalic.  Cardiovascular:     Rate and Rhythm: Normal rate and regular rhythm.     Pulses: Normal pulses.     Heart sounds: Normal heart  sounds. No murmur heard.  No friction rub. No gallop.   Pulmonary:     Effort: Pulmonary effort is normal. No respiratory distress.     Breath sounds: Normal breath sounds. No stridor. No wheezing, rhonchi or rales.  Chest:     Chest wall: No tenderness.  Musculoskeletal:        General: Tenderness present.     Right hand: Normal.     Left hand: Swelling and tenderness present.     Comments: Left hand is with obvious deformity compared to the right hand.  Swelling present on left middle finger.  No ecchymosis, open wound,, lesion, subungual hematoma, warmth present.  Limited range of motion due to swelling and pain on left middle finger.  Neurovascular status intact.  Neurological:     Mental Status: She is alert and oriented to person, place, and time.     LABS:  No results found for this or any previous visit (from the past 24 hour(s)).   ASSESSMENT & PLAN:  1. Pain of left middle finger   2. Injury of left middle finger, initial encounter     No orders of the defined types were placed in this encounter.  Discharge instructions  Take OTC Tylenol/ibuprofen as needed for pain Follow RICE instruction that is attached Go to [facility] and have  x-ray completed.  We will call you with abnormal result Follow-up with PCP/orthopedic Return or go to ED for worsening of symptoms  Reviewed expectations re: course of current medical issues. Questions  answered. Outlined signs and symptoms indicating need for more acute intervention. Patient verbalized understanding. After Visit Summary given.         Emerson Monte, [place] 08/28/20 1222

## 2020-08-28 NOTE — Discharge Instructions (Addendum)
Take OTC Tylenol/ibuprofen as needed for pain Follow RICE instruction that is attached Go to [facility] and have  x-ray completed.  We will call you with abnormal result Follow-up with PCP/orthopedic Return or go to ED for worsening of symptoms

## 2020-08-28 NOTE — ED Triage Notes (Signed)
Swelling to LT middle finger after shutting it in cedar chest

## 2020-08-29 ENCOUNTER — Encounter (HOSPITAL_COMMUNITY): Payer: Self-pay | Admitting: Emergency Medicine

## 2020-08-29 ENCOUNTER — Other Ambulatory Visit: Payer: Self-pay

## 2020-08-29 ENCOUNTER — Emergency Department (HOSPITAL_COMMUNITY): Payer: 59

## 2020-08-29 DIAGNOSIS — J40 Bronchitis, not specified as acute or chronic: Secondary | ICD-10-CM | POA: Diagnosis not present

## 2020-08-29 DIAGNOSIS — F17210 Nicotine dependence, cigarettes, uncomplicated: Secondary | ICD-10-CM | POA: Insufficient documentation

## 2020-08-29 DIAGNOSIS — R059 Cough, unspecified: Secondary | ICD-10-CM | POA: Diagnosis present

## 2020-08-29 NOTE — ED Triage Notes (Signed)
Pt c/o productive cough and sob at times. Pt is speaking in complete symptoms.

## 2020-08-29 NOTE — Telephone Encounter (Signed)
Documented in referral  

## 2020-08-30 ENCOUNTER — Emergency Department (HOSPITAL_COMMUNITY)
Admission: EM | Admit: 2020-08-30 | Discharge: 2020-08-30 | Disposition: A | Discharge status: Home / Self Care | Payer: 59 | Attending: Emergency Medicine | Admitting: Emergency Medicine

## 2020-08-30 DIAGNOSIS — R059 Cough, unspecified: Secondary | ICD-10-CM

## 2020-08-30 DIAGNOSIS — J40 Bronchitis, not specified as acute or chronic: Secondary | ICD-10-CM

## 2020-08-30 MED ORDER — PREDNISONE 20 MG PO TABS: ORAL_TABLET | ORAL | 0 refills | Status: DC

## 2020-08-30 MED ORDER — ALBUTEROL SULFATE HFA 108 (90 BASE) MCG/ACT IN AERS
8.0000 | INHALATION_SPRAY | Freq: Once | RESPIRATORY_TRACT | Status: AC
  Administered 2020-08-30: 8 via RESPIRATORY_TRACT
  Filled 2020-08-30: qty 6.7

## 2020-08-30 MED ORDER — VARENICLINE TARTRATE 0.5 MG PO TABS: 0.5000 mg | ORAL_TABLET | Freq: Two times a day (BID) | ORAL | 0 refills | Status: DC

## 2020-08-30 MED ORDER — BENZONATATE 100 MG PO CAPS: 100.0000 mg | ORAL_CAPSULE | Freq: Three times a day (TID) | ORAL | 0 refills | Status: DC | PRN

## 2020-08-30 MED ORDER — AEROCHAMBER Z-STAT PLUS/MEDIUM MISC
1.0000 | Freq: Once | Status: AC
  Administered 2020-08-30: 1

## 2020-08-30 MED ORDER — AZITHROMYCIN 250 MG PO TABS
500.0000 mg | ORAL_TABLET | Freq: Once | ORAL | Status: AC
  Administered 2020-08-30: 500 mg via ORAL
  Filled 2020-08-30: qty 2

## 2020-08-30 MED ORDER — PREDNISONE 50 MG PO TABS
60.0000 mg | ORAL_TABLET | Freq: Once | ORAL | Status: AC
  Administered 2020-08-30: 60 mg via ORAL
  Filled 2020-08-30: qty 1

## 2020-08-30 MED ORDER — AZITHROMYCIN 250 MG PO TABS: 250.0000 mg | ORAL_TABLET | Freq: Every day | ORAL | 0 refills | Status: DC

## 2020-08-30 NOTE — ED Provider Notes (Signed)
[facility] EMERGENCY DEPARTMENT Provider Note   CSN: [id number] Arrival date & time: 08/29/20  1927     History Chief Complaint  Patient presents with  . Cough    Christina Esparza is a 59 y.o. female.   Cough Cough characteristics:  Productive Sputum characteristics:  Clear Severity:  Mild Duration:  3 days Timing:  Constant Progression:  Worsening Chronicity:  New Smoker: yes   Relieved by:  None tried Worsened by:  Nothing Ineffective treatments:  None tried Associated symptoms: chest pain, rhinorrhea and wheezing   Associated symptoms: no fever and no rash        Past Medical History:  Diagnosis Date  . Anxiety   . Depression     Patient Active Problem List   Diagnosis Date Noted  . 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 Gastroesophageal reflux disease 10/22/2019  . GERD (gastroesophageal reflux disease) 10/21/2019  . Shortness of breath 10/21/2019  . Anxiety state 08/24/2015    Past Surgical History:  Procedure Laterality Date  . COLONOSCOPY N/A 06/17/2013   Procedure: COLONOSCOPY;  Surgeon: Rogene Houston, MD;  [facility];  Service: Endoscopy;  Laterality: N/A;  730  . ESOPHAGEAL BRUSHING  11/18/2019   Procedure: ESOPHAGEAL BRUSHING;  Surgeon: Rogene Houston, MD;  [facility];  Service: Endoscopy;;  . ESOPHAGEAL DILATION N/A 11/15/2015   Procedure: ESOPHAGEAL DILATION;  Surgeon: Rogene Houston, MD;  [facility];  Service: Endoscopy;  Laterality: N/A;  . ESOPHAGOGASTRODUODENOSCOPY N/A 11/15/2015   Procedure: ESOPHAGOGASTRODUODENOSCOPY (EGD);  Surgeon: Rogene Houston, MD;  [facility];  Service: Endoscopy;  Laterality: N/A;  . ESOPHAGOGASTRODUODENOSCOPY N/A 11/18/2019   Procedure: ESOPHAGOGASTRODUODENOSCOPY (EGD);  Surgeon: Rogene Houston, MD;  [facility];  Service: Endoscopy;  Laterality: N/A;  2:25  . NASAL SEPTOPLASTY W/ TURBINOPLASTY Bilateral 11/18/2018   Procedure: NASAL  SEPTOPLASTY WITH BILATERAL TURBINATE REDUCTION;  Surgeon: Leta Baptist, MD;  Location: [place];  Service: ENT;  Laterality: Bilateral;  . TUBAL LIGATION       OB History   No obstetric history on file.     Family History  Problem Relation Age of Onset  . Colon polyps Mother   . Prostate cancer Father   . Colon cancer Neg Hx     Social History   Tobacco Use  . Smoking status: Current Every Day Smoker    Packs/day: 1.00    Types: Cigarettes  . Smokeless tobacco: Never Used  . Tobacco comment: over 20 yrs.   Vaping Use  . Vaping Use: Never used  Substance Use Topics  . Alcohol use: No    Alcohol/week: 0.0 standard drinks  . Drug use: No    Home Medications Prior to Admission medications   Medication Sig Start Date End Date Taking? Authorizing Provider  ALPRAZolam Duanne Moron) 1 MG tablet Take 1 mg by mouth 3 (three) times daily as needed for anxiety. 10/29/19   [provider]  azelastine (ASTELIN) 0.1 % nasal spray Place 1 spray into both nostrils 2 (two) times daily. Use in each nostril as directed 08/26/20 09/25/20  Valentina Shaggy, MD  azithromycin (ZITHROMAX) 250 MG tablet Take 1 tablet (250 mg total) by mouth daily. Take first 2 tablets together, then 1 every day until finished. 08/30/20   Maryssa Giampietro, Corene Cornea, MD  benzonatate (TESSALON) 100 MG capsule Take 1 capsule (100 mg total) by mouth 3 (three) times daily as needed for cough. 08/30/20  Pierrette Scheu, Corene Cornea, MD  Budeson-Glycopyrrol-Formoterol (BREZTRI AEROSPHERE) 160-9-4.8 MCG/ACT AERO Inhale 2 puffs into the lungs in the morning and at bedtime. With spacer. 07/27/20   Althea Charon, FNP  buPROPion (WELLBUTRIN) 100 MG tablet TAKE 1 TABLET BY MOUTH EVERY DAY FOR 3 DAYS THEN 1 TABLET TWICE DAILY FOR 3 DAYS THEN 1 TABLET THREE TIMES DAILY Patient not taking: Reported on 08/26/2020 05/10/20   [provider]  cyclobenzaprine (FLEXERIL) 10 MG tablet Take 10 mg by mouth at bedtime as needed for spasms.  10/26/19   [provider]  omeprazole (PRILOSEC) 40 MG capsule Take 1 capsule (40 mg total) by mouth 2 (two) times daily before a meal. Patient not taking: Reported on 08/26/2020 12/17/19   Minus Liberty, PA-C  predniSONE (DELTASONE) 20 MG tablet 3 tabs po day one, then 2 po daily x 4 days 08/30/20   Aleka Twitty, Corene Cornea, MD  PROAIR HFA 108 [phone] Base) MCG/ACT inhaler Inhale 2 puffs into the lungs every 4 (four) hours as needed for wheezing or shortness of breath. 11/01/19   [provider]  sertraline (ZOLOFT) 50 MG tablet Take 150 mg by mouth every evening. 10/29/19   [provider]  varenicline (CHANTIX) 0.5 MG tablet Take 1 tablet (0.5 mg total) by mouth 2 (two) times daily. 08/30/20   Thijs Brunton, Corene Cornea, MD    Allergies    Amitiza [lubiprostone], Linaclotide, Ceftin [cefuroxime], Codeine, Levaquin [levofloxacin in d5w], and Penicillins  Review of Systems   Review of Systems  Constitutional: Negative for fever.  HENT: Positive for rhinorrhea.   Respiratory: Positive for cough and wheezing.   Cardiovascular: Positive for chest pain.  Skin: Negative for rash.  All other systems reviewed and are negative.   Physical Exam Updated Vital Signs BP 115/68   Pulse 73   Temp 98.6 F (37 C)   Resp 18   Ht 5\' 1"  (1.549 m)   Wt 54.4 kg   SpO2 92%   BMI 22.67 kg/m   Physical Exam Vitals and nursing note reviewed.  Constitutional:      Appearance: She is well-developed.  HENT:     Head: Normocephalic and atraumatic.     Mouth/Throat:     Mouth: Mucous membranes are moist.     Pharynx: Oropharynx is clear.  Eyes:     Pupils: Pupils are equal, round, and reactive to light.  Cardiovascular:     Rate and Rhythm: Normal rate and regular rhythm.  Pulmonary:     Effort: Pulmonary effort is normal. No respiratory distress.     Breath sounds: No stridor. Wheezing present.  Abdominal:     General: There is no distension.  Musculoskeletal:     Cervical back: Normal  range of motion.     Right lower leg: No edema.     Left lower leg: No edema.  Neurological:     General: No focal deficit present.     Mental Status: She is alert.     ED Results / Procedures / Treatments   Labs (all labs ordered are listed, but only abnormal results are displayed) Labs Reviewed - No data to display  EKG None  Radiology DG Chest [facility] 1 View  Result Date: 08/29/2020 CLINICAL DATA:  60 year old female with shortness of breath and productive cough. Smoker. EXAM: PORTABLE CHEST 1 VIEW COMPARISON:  Chest CT 11/09/2019 and earlier. FINDINGS: Portable AP upright view at 2056 hours. Chronically large lung volumes. Mediastinal contours remain normal. Visualized tracheal air column is within normal  limits. Mildly symmetric increased interstitial markings. Otherwise when allowing for portable technique both lungs appear clear. No pneumothorax or pleural effusion. No acute osseous abnormality identified. Negative visible bowel gas pattern. IMPRESSION: No acute cardiopulmonary abnormality. Chronically large lung volumes with mildly increased interstitial markings, favor smoking related. Electronically Signed   By: Genevie Ann M.D.   On: 08/29/2020 21:12   DG Hand Complete Left  Result Date: 08/28/2020 CLINICAL DATA:  Left middle finger injury, crushed within a cedar chest EXAM: LEFT HAND - COMPLETE 3+ VIEW COMPARISON:  None. FINDINGS: There is a tiny crescentic radiodensity seen along the volar aspect of the third middle phalangeal base which could reflect a small minimally displaced fracture with extension into the proximal interphalangeal joint. Additional cortical step-off seen along the ulnar aspect of the base of the third middle phalanx as well. Focal soft tissue swelling is noted at this level. No soft tissue gas or foreign body. There is a background of diffuse osteoarthrosis most pronounced at the first carpometacarpal and triscaphe joints as well as at the distal pole scaphoid  and diffusely through the interphalangeal joints with periarticular osteophyte formation including likely remote corticated fragmented osteophytes along the dorsal base of the first distal phalanx and head of the fifth middle phalanx though could correlate for point tenderness at these locations. IMPRESSION: 1. Suspect tiny minimally displaced, comminuted fracture of the third middle phalangeal base with extension into the proximal interphalangeal joint. 2. Background of diffuse osteoarthrosis, as above. 3. Several fragmented osteophytes are present at the first distal phalanx and fifth middle phalanx, likely remote though could correlate for point tenderness at these locations as well. Electronically Signed   By: Lovena Le M.D.   On: 08/28/2020 23:52    Procedures Procedures (including critical care time)   Counseled patient for approximately 9 minutes regarding smoking cessation. Discussed risks of smoking and how they applied and affected their visit here today. Patient isn't sure if they are ready to quit at this time, chantix prescribed, will follow up with their primary doctor if they are.   CPT code: [phone]: intermediate counseling for smoking cessation   Medications Ordered in ED Medications  albuterol (VENTOLIN HFA) 108 (90 Base) MCG/ACT inhaler 8 puff (8 puffs Inhalation Given 08/30/20 0045)  aerochamber Z-Stat Plus/medium 1 each (1 each Other Given 08/30/20 0045)  predniSONE (DELTASONE) tablet 60 mg (60 mg Oral Given 08/30/20 0046)  azithromycin (ZITHROMAX) tablet 500 mg (500 mg Oral Given 08/30/20 0046)    ED Course  I have reviewed the triage vital signs and the nursing notes.  Pertinent labs & imaging results that were available during my care of the patient were reviewed by me and considered in my medical decision making (see chart for details).    MDM Rules/Calculators/A&P                          Suspect undiagnosed COPD/Emphysema and this is an exacerbation of the  same. imporved with treatments above. Will also likely quit smoking.   Final Clinical Impression(s) / ED Diagnoses Final diagnoses:  Bronchitis    Rx / DC Orders ED Discharge Orders         Ordered    predniSONE (DELTASONE) 20 MG tablet        08/30/20 0213    azithromycin (ZITHROMAX) 250 MG tablet  Daily        08/30/20 0213    benzonatate (TESSALON) 100 MG capsule  3  times daily PRN        08/30/20 0213    varenicline (CHANTIX) 0.5 MG tablet  2 times daily        08/30/20 7353           Nyisha Clippard, Corene Cornea, MD 08/30/20 [id number]

## 2020-08-31 ENCOUNTER — Telehealth: Payer: Self-pay | Admitting: Physical Medicine and Rehabilitation

## 2020-08-31 NOTE — Telephone Encounter (Signed)
Patient returning a phone call to schedule with Dr. Ernestina Patches.

## 2020-08-31 NOTE — Telephone Encounter (Signed)
Called pt back and sch 10/27.

## 2020-09-02 ENCOUNTER — Telehealth: Payer: Self-pay

## 2020-09-02 MED ORDER — IPRATROPIUM-ALBUTEROL 0.5-2.5 (3) MG/3ML IN SOLN: 3.0000 mL | Freq: Four times a day (QID) | RESPIRATORY_TRACT | 5 refills | Status: DC | PRN

## 2020-09-02 NOTE — Telephone Encounter (Signed)
Patient called to inform us that she recently went to the ED twice since her visit on Friday 10/8. She went to the ED for Cough and SOB. She did a covid test that was negative on Monday @ [facility] in [place]. Patient states she has been placed on Steroids, Mucinex and other medications.  She called to see what else could be done as she still just feels like she can not catch her breath.  I spoke with Dr Ernst Bowler and he verbally informed me to get the patient a nebulizer for pick up, inform her to continue on all medications the ED prescribed & refer her to pulmonary.  Patient is scheduled to see Dr Halford Chessman on 09/07/20 in [place]. Nebulizer at the front with me for pick up.  I will inform the patient of the visit with Pulmonary once she comes in to pick up the nebulizer.;   Dr Ernst Bowler do you have anything else to add?

## 2020-09-02 NOTE — Telephone Encounter (Signed)
Discussed the patient with our Patient Care Coordinator.  She has resisted referrals to pulmonology at my last couple of visits, but I think at this point this is the only option we have left.  We will try to get her in next week for an evaluation. We are also going to give her a nebulizer in the interim and use DuoNeb's every 4-6 hours as needed.  Salvatore Marvel, MD [facility] [place]

## 2020-09-07 ENCOUNTER — Other Ambulatory Visit: Payer: Self-pay

## 2020-09-07 ENCOUNTER — Ambulatory Visit (INDEPENDENT_AMBULATORY_CARE_PROVIDER_SITE_OTHER): Payer: 59 | Admitting: Pulmonary Disease

## 2020-09-07 ENCOUNTER — Encounter: Payer: Self-pay | Admitting: Pulmonary Disease

## 2020-09-07 VITALS — BP 128/70 | HR 54 | Temp 97.4°F | Ht 61.0 in | Wt 111.4 lb

## 2020-09-07 DIAGNOSIS — J441 Chronic obstructive pulmonary disease with (acute) exacerbation: Secondary | ICD-10-CM | POA: Diagnosis not present

## 2020-09-07 DIAGNOSIS — J449 Chronic obstructive pulmonary disease, unspecified: Secondary | ICD-10-CM

## 2020-09-07 DIAGNOSIS — Z720 Tobacco use: Secondary | ICD-10-CM

## 2020-09-07 MED ORDER — METHYLPREDNISOLONE ACETATE 80 MG/ML IJ SUSP
80.0000 mg | Freq: Once | INTRAMUSCULAR | Status: AC
  Administered 2020-09-07: 80 mg via INTRAMUSCULAR

## 2020-09-07 MED ORDER — PREDNISONE 10 MG PO TABS: ORAL_TABLET | ORAL | 0 refills | Status: AC

## 2020-09-07 MED ORDER — DOXYCYCLINE HYCLATE 100 MG PO TABS: 100.0000 mg | ORAL_TABLET | Freq: Two times a day (BID) | ORAL | 0 refills | Status: DC

## 2020-09-07 NOTE — Patient Instructions (Addendum)
Depo medrol shot today  Prednisone 10 mg pill >> 4 pills daily for 2 days, 3 pills daily for 2 days, 2 pills daily for 2 days, 1 pill daily for 2 days  Doxycycline 100 mg pill twice per day for 7 days  Albuterol every 4 to 6 hours as needed for cough, wheeze, or chest congestion  Follow up in 1 to 2 weeks with Dr. Halford Chessman or Nurse Practitioner

## 2020-09-07 NOTE — Progress Notes (Signed)
[facility]  Chief Complaint  Patient presents with  . Consult    shortness of breath at rest and activity, productive cough with clear colored phlegm    Constitutional:  BP 128/70 (BP Location: Left Arm, Cuff Size: Normal)   Pulse (!) 54   Temp (!) 97.4 F (36.3 C) (Other (Comment)) Comment (Src): wrist  Ht 5\' 1"  (1.549 m)   Wt 111 lb 6.4 oz (50.5 kg)   SpO2 93% Comment: room air  BMI 21.05 kg/m   Past Medical History:  Anxiety, Depression, Perennial allergic rhinitis  Past Surgical History:  Her  has a past surgical history that includes Tubal ligation; Colonoscopy (N/A, 06/17/2013); Esophagogastroduodenoscopy (N/A, 11/15/2015); Esophageal dilation (N/A, 11/15/2015); Nasal septoplasty w/ turbinoplasty (Bilateral, 11/18/2018); Esophagogastroduodenoscopy (N/A, 11/18/2019); and Esophageal brushing (11/18/2019).  Brief Summary:  Christina Esparza is a 59 y.o. female smoker for assessment of COPD.      Subjective:   She has been followed by Dr. Ernst Bowler for allergies and asthma.  She had spirometry recently that showed air flow obstruction concerning for COPD.  She smoked up to 1 ppd but hasn't been able to smoke for the past 2 weeks because her breathing has acutely gotten worse.  She isn't sure what triggered this.  She felt like her air was getting squeezed.  She has a cough with clear sputum and wheezing.  She went to the ER on 08/28/20.  CXR from 08/29/20 showed hyperinflation and mild increase in interstitial markings.  She was discharged home with zithromax and prednisone.  She still is having symptoms.  She has been using albuterol and breztri and these help some.    Physical Exam:   Appearance - well kempt, thin  ENMT - no sinus tenderness, no oral exudate, no LAN, Mallampati 2 airway, no stridor  Respiratory - coarse breath sounds b/l with scattered rhonchi that clear with cough, faint b/l wheezing  CV - s1s2 regular rate  and rhythm, no murmurs  Ext - no clubbing, no edema  Skin - no rashes  Psych - normal mood and affect   Pulmonary testing:   Spirometry 09/02/20 >> FEV1 1.47 (53%), FEV1% 59  Chest Imaging:   CT chest 11/09/19 >> coronary calcification, lungs clear  Social History:  She  reports that she has been smoking cigarettes. She has a 20.00 pack-year smoking history. She has never used smokeless tobacco. She reports that she does not drink alcohol and does not use drugs.  Family History:  Her family history includes Colon polyps in her mother; Prostate cancer in her father.     Assessment/Plan:   COPD. - she appears to be having a slow to resolve exacerbation - will give additional course of antibiotic and steroids - depo medrol injection in office today followed by prednisone taper - 7 days of doxycycline - continue breztri - prn albuterol inhaler or nebulizer - will eventually need full set of PFTs - noted to have increased interstitial markings on CXR from 08/29/20 >> repeat CXR at next follow up - will need to chest A1AT, IgE, CBC with diff when clinically stable  Tobacco abuse. - she hasn't been able to smoke for the past two weeks - encouraged her to refrain from smoking cigarettes again  Perennial allergic rhinitis. - dogs, cats - followed by Dr. Salvatore Marvel with [facility]  Time Spent Involved in Patient Care on Day of Examination:  47 minutes  Follow up:  Patient  Instructions  Depo medrol shot today  Prednisone 10 mg pill >> 4 pills daily for 2 days, 3 pills daily for 2 days, 2 pills daily for 2 days, 1 pill daily for 2 days  Doxycycline 100 mg pill twice per day for 7 days  Albuterol every 4 to 6 hours as needed for cough, wheeze, or chest congestion  Follow up in 1 to 2 weeks with Dr. Halford Chessman or Nurse Practitioner   Medication List:   Allergies as of 09/07/2020      Reactions   Amitiza [lubiprostone] Itching   Patient states that she  experienced a pin sticking sensation, prickling all over.   Linaclotide Nausea Only   Ceftin [cefuroxime] Itching   Codeine Nausea And Vomiting   Levaquin [levofloxacin In D5w]    dream   Penicillins Other (See Comments)   Felt like she was choking Did it involve swelling of the face/tongue/throat, SOB, or low BP? Unknown Did it involve sudden or severe rash/hives, skin peeling, or any reaction on the inside of your mouth or nose? No Did you need to seek medical attention at a hospital or doctor's office? No When did it last happen?~10 years ago If all above answers are "NO", may proceed with cephalosporin use. Felt like she was choking Felt like she was choking Did it involve swelling of the face/tongue/throat, SOB, or low BP? Unknown Did it involve sudden or severe rash/hives, skin peeling, or any reaction on the inside of your mouth or nose? No Did you need to seek medical attention at a hospital or doctor's office? No When did it last happen?~10 years ago If all above answers are "NO", may proceed with cephalosporin use.      Medication List       Accurate as of September 07, 2020 12:10 PM. If you have any questions, ask your nurse or doctor.        STOP taking these medications   azithromycin 250 MG tablet Commonly known as: ZITHROMAX Stopped by: Chesley Mires, MD     TAKE these medications   ALPRAZolam 1 MG tablet Commonly known as: XANAX Take 1 mg by mouth 3 (three) times daily as needed for anxiety.   azelastine 0.1 % nasal spray Commonly known as: ASTELIN Place 1 spray into both nostrils 2 (two) times daily. Use in each nostril as directed   benzonatate 100 MG capsule Commonly known as: TESSALON Take 1 capsule (100 mg total) by mouth 3 (three) times daily as needed for cough.   Breztri Aerosphere 160-9-4.8 MCG/ACT Aero Generic drug: Budeson-Glycopyrrol-Formoterol Inhale 2 puffs into the lungs in the morning and at bedtime. With spacer.   buPROPion 100 MG  tablet Commonly known as: WELLBUTRIN TAKE 1 TABLET BY MOUTH EVERY DAY FOR 3 DAYS THEN 1 TABLET TWICE DAILY FOR 3 DAYS THEN 1 TABLET THREE TIMES DAILY   cyclobenzaprine 10 MG tablet Commonly known as: FLEXERIL Take 10 mg by mouth at bedtime as needed for spasms.   doxycycline 100 MG tablet Commonly known as: VIBRA-TABS Take 1 tablet (100 mg total) by mouth 2 (two) times daily. Started by: Chesley Mires, MD   ipratropium-albuterol 0.5-2.5 (3) MG/3ML Soln Commonly known as: DUONEB Take 3 mLs by nebulization every 6 (six) hours as needed.   omeprazole 40 MG capsule Commonly known as: PRILOSEC Take 1 capsule (40 mg total) by mouth 2 (two) times daily before a meal.   predniSONE 10 MG tablet Commonly known as: DELTASONE Take 4 tablets (40 mg  total) by mouth daily with breakfast for 2 days, THEN 3 tablets (30 mg total) daily with breakfast for 2 days, THEN 2 tablets (20 mg total) daily with breakfast for 2 days, THEN 1 tablet (10 mg total) daily with breakfast for 2 days. Start taking on: September 07, 2020 What changed:   medication strength  See the new instructions. Changed by: Chesley Mires, MD   ProAir HFA 108 [phone] Base) MCG/ACT inhaler Generic drug: albuterol Inhale 2 puffs into the lungs every 4 (four) hours as needed for wheezing or shortness of breath.   sertraline 50 MG tablet Commonly known as: ZOLOFT Take 150 mg by mouth every evening.   varenicline 0.5 MG tablet Commonly known as: Chantix Take 1 tablet (0.5 mg total) by mouth 2 (two) times daily.       Signature:  Chesley Mires, MD [place] Pager - [phone] 09/07/2020, 12:10 PM

## 2020-09-14 ENCOUNTER — Telehealth: Payer: Self-pay | Admitting: Physical Medicine and Rehabilitation

## 2020-09-14 ENCOUNTER — Ambulatory Visit (INDEPENDENT_AMBULATORY_CARE_PROVIDER_SITE_OTHER): Payer: 59 | Admitting: Adult Health

## 2020-09-14 ENCOUNTER — Other Ambulatory Visit: Payer: Self-pay

## 2020-09-14 ENCOUNTER — Ambulatory Visit: Payer: 59

## 2020-09-14 ENCOUNTER — Encounter: Payer: Self-pay | Admitting: Physical Medicine and Rehabilitation

## 2020-09-14 ENCOUNTER — Encounter: Payer: Self-pay | Admitting: Adult Health

## 2020-09-14 ENCOUNTER — Ambulatory Visit (INDEPENDENT_AMBULATORY_CARE_PROVIDER_SITE_OTHER): Payer: 59 | Admitting: Physical Medicine and Rehabilitation

## 2020-09-14 VITALS — BP 128/60 | HR 84 | Temp 97.5°F | Ht 61.0 in | Wt 111.4 lb

## 2020-09-14 VITALS — BP 115/72 | HR 89

## 2020-09-14 DIAGNOSIS — M7918 Myalgia, other site: Secondary | ICD-10-CM | POA: Diagnosis not present

## 2020-09-14 DIAGNOSIS — M542 Cervicalgia: Secondary | ICD-10-CM | POA: Diagnosis not present

## 2020-09-14 DIAGNOSIS — J441 Chronic obstructive pulmonary disease with (acute) exacerbation: Secondary | ICD-10-CM

## 2020-09-14 DIAGNOSIS — M47812 Spondylosis without myelopathy or radiculopathy, cervical region: Secondary | ICD-10-CM | POA: Diagnosis not present

## 2020-09-14 DIAGNOSIS — R0602 Shortness of breath: Secondary | ICD-10-CM

## 2020-09-14 DIAGNOSIS — J449 Chronic obstructive pulmonary disease, unspecified: Secondary | ICD-10-CM | POA: Insufficient documentation

## 2020-09-14 NOTE — Progress Notes (Signed)
Upper neck pain, pain behind ears. Ears hurt. Feels like ears are stopped up. Numeric Pain Rating Scale and Functional Assessment Average Pain 5 Pain Right Now 3 My pain is intermittent, dull and aching Pain is worse with: lying down Pain improves with: nothing   In the last MONTH (on 0-10 scale) has pain interfered with the following?  1. General activity like being  able to carry out your everyday physical activities such as walking, climbing stairs, carrying groceries, or moving a chair?  Rating(8)  2. Relation with others like being able to carry out your usual social activities and roles such as  activities at home, at work and in your community. Rating(8)  3. Enjoyment of life such that you have  been bothered by emotional problems such as feeling anxious, depressed or irritable?  Rating(8)

## 2020-09-14 NOTE — Assessment & Plan Note (Signed)
Recent slow to resolve COPD exacerbation now improving after course of antibiotics and steroids. Patient is encouraged on smoking cessation.  Previous chest x-ray earlier this month showed some mildly increased interstitial markings.  Will repeat chest x-ray. We will get full set of PFTs on return. Also will get lab work on return check alpha-1 and CBC with differential as she has a history of allergies.  Looking at her eosinophils.  She just finished prednisone today so would like to wait until she has been off of prednisone for a few days.  Plan  Patient Instructions  Continue on BREZTRI 2 puffs Twice daily , rinse after use.  Work on not smoking  Albuterol inhaler /nebs As needed  Wheezing .  Mucinex DM Twice daily  As needed  Cough/congestion  Follow up with Dr. Halford Chessman  Or Deniz Hannan NP in 6 weeks with PFT and labs.  Please contact office for sooner follow up if symptoms do not improve or worsen or seek emergency care

## 2020-09-14 NOTE — Progress Notes (Signed)
Reviewed and agree with assessment/plan.   Chesley Mires, MD [facility] 09/14/2020, 12:48 PM Pager:  [phone]

## 2020-09-14 NOTE — Telephone Encounter (Signed)
Needs auth for bilateral [id number] and [id number] x2. Bilateral C2-3, C3-4, and C4-5 facets. Scheduled for 11/11.

## 2020-09-14 NOTE — Patient Instructions (Addendum)
Continue on BREZTRI 2 puffs Twice daily , rinse after use.  Work on not smoking  Albuterol inhaler /nebs As needed  Wheezing .  Mucinex DM Twice daily  As needed  Cough/congestion  Follow up with Dr. Halford Chessman  Or Melessa Cowell NP in 6 weeks with PFT and labs.  Please contact office for sooner follow up if symptoms do not improve or worsen or seek emergency care

## 2020-09-14 NOTE — Progress Notes (Signed)
@Patient  ID: Christina Esparza, female    DOB: May 17, 1961, 59 y.o.   MRN: [id number]  Chief Complaint  Patient presents with  . Follow-up    COPD     Referring provider: Sharilyn Sites, MD  HPI: 59 year old female active smoker seen for pulmonary consult September 07, 2020 for COPD and asthma  TEST/EVENTS :   Spirometry 09/02/20 >> FEV1 1.47 (53%), FEV1% 59   CT chest 11/09/19 >> coronary calcification, lungs clear   09/14/2020 Follow up: COPD  Patient presents for a 1 week follow-up.  Patient was seen last visit with pulmonary consult for slow to resolve COPD exacerbation with ongoing cough and congestion. She was given doxycycline x7 days a Depo-Medrol injection and a prednisone taper.  COVID-19 testing has been negative.  Since last visit patient says she is starting to feel better.  Cough and congestion have decreased.  She finished her prednisone today.  And is completed a full course of her doxycycline.  Patient denies any hemoptysis chest pain orthopnea PND.  She does remain on Breztri inhaler .  She has cut back on smoking. She denies any increased albuterol use. Declines Covid vaccine and flu shot  Allergies  Allergen Reactions  . Amitiza [Lubiprostone] Itching    Patient states that she experienced a pin sticking sensation, prickling all over.  . Linaclotide Nausea Only  . Ceftin [Cefuroxime] Itching  . Codeine Nausea And Vomiting  . Levaquin [Levofloxacin In D5w]     dream  . Penicillins Other (See Comments)    Felt like she was choking Did it involve swelling of the face/tongue/throat, SOB, or low BP? Unknown Did it involve sudden or severe rash/hives, skin peeling, or any reaction on the inside of your mouth or nose? No Did you need to seek medical attention at a hospital or doctor's office? No When did it last happen?~10 years ago If all above answers are "NO", may proceed with cephalosporin use.  Felt like she was choking Felt like she was choking Did it  involve swelling of the face/tongue/throat, SOB, or low BP? Unknown Did it involve sudden or severe rash/hives, skin peeling, or any reaction on the inside of your mouth or nose? No Did you need to seek medical attention at a hospital or doctor's office? No When did it last happen?~10 years ago If all above answers are "NO", may proceed with cephalosporin use.      There is no immunization history on file for this patient.  Past Medical History:  Diagnosis Date  . Anxiety   . Depression     Tobacco History: Social History   Tobacco Use  Smoking Status Current Every Day Smoker  . Packs/day: 1.00  . Years: 20.00  . Pack years: 20.00  . Types: Cigarettes  Smokeless Tobacco Never Used  Tobacco Comment   smokes 4-5 cigarettes per day 10/20/201   Ready to quit: No Counseling given: Yes Comment: smokes 4-5 cigarettes per day 10/20/201   Outpatient Medications Prior to Visit  Medication Sig Dispense Refill  . ALPRAZolam (XANAX) 1 MG tablet Take 1 mg by mouth 3 (three) times daily as needed for anxiety.    Marland Kitchen azelastine (ASTELIN) 0.1 % nasal spray Place 1 spray into both nostrils 2 (two) times daily. Use in each nostril as directed 30 mL 11  . Budeson-Glycopyrrol-Formoterol (BREZTRI AEROSPHERE) 160-9-4.8 MCG/ACT AERO Inhale 2 puffs into the lungs in the morning and at bedtime. With spacer. 10.7 g 5  . cyclobenzaprine (FLEXERIL) 10  MG tablet Take 10 mg by mouth at bedtime as needed for spasms.    Marland Kitchen ipratropium-albuterol (DUONEB) 0.5-2.5 (3) MG/3ML SOLN Take 3 mLs by nebulization every 6 (six) hours as needed. 3 mL 5  . PROAIR HFA 108 (90 Base) MCG/ACT inhaler Inhale 2 puffs into the lungs every 4 (four) hours as needed for wheezing or shortness of breath.    . sertraline (ZOLOFT) 50 MG tablet Take 150 mg by mouth every evening.    . benzonatate (TESSALON) 100 MG capsule Take 1 capsule (100 mg total) by mouth 3 (three) times daily as needed for cough. (Patient not taking: Reported on  09/14/2020) 21 capsule 0  . buPROPion (WELLBUTRIN) 100 MG tablet TAKE 1 TABLET BY MOUTH EVERY DAY FOR 3 DAYS THEN 1 TABLET TWICE DAILY FOR 3 DAYS THEN 1 TABLET THREE TIMES DAILY (Patient not taking: Reported on 08/26/2020)    . doxycycline (VIBRA-TABS) 100 MG tablet Take 1 tablet (100 mg total) by mouth 2 (two) times daily. (Patient not taking: Reported on 09/14/2020) 14 tablet 0  . omeprazole (PRILOSEC) 40 MG capsule Take 1 capsule (40 mg total) by mouth 2 (two) times daily before a meal. (Patient not taking: Reported on 08/26/2020) 90 capsule 3  . predniSONE (DELTASONE) 10 MG tablet Take 4 tablets (40 mg total) by mouth daily with breakfast for 2 days, THEN 3 tablets (30 mg total) daily with breakfast for 2 days, THEN 2 tablets (20 mg total) daily with breakfast for 2 days, THEN 1 tablet (10 mg total) daily with breakfast for 2 days. (Patient not taking: Reported on 09/14/2020) 20 tablet 0  . varenicline (CHANTIX) 0.5 MG tablet Take 1 tablet (0.5 mg total) by mouth 2 (two) times daily. (Patient not taking: Reported on 09/07/2020) 60 tablet 0   No facility-administered medications prior to visit.     Review of Systems:   Constitutional:   No  weight loss, night sweats,  Fevers, chills,  +fatigue, or  lassitude.  HEENT:   No headaches,  Difficulty swallowing,  Tooth/dental problems, or  Sore throat,                No sneezing, itching, ear ache,  +nasal congestion, post nasal drip,   CV:  No chest pain,  Orthopnea, PND, swelling in lower extremities, anasarca, dizziness, palpitations, syncope.   GI  No heartburn, indigestion, abdominal pain, nausea, vomiting, diarrhea, change in bowel habits, loss of appetite, bloody stools.   Resp:    No chest wall deformity  Skin: no rash or lesions.  GU: no dysuria, change in color of urine, no urgency or frequency.  No flank pain, no hematuria   MS:  No joint pain or swelling.  No decreased range of motion.  No back pain.    Physical Exam  BP  128/60 (BP Location: Left Arm, Cuff Size: Normal)   Pulse 84   Temp (!) 97.5 F (36.4 C) (Temporal)   Ht 5\' 1"  (1.549 m)   Wt 111 lb 6.4 oz (50.5 kg)   SpO2 98%   BMI 21.05 kg/m   GEN: A/Ox3; pleasant , NAD, well nourished    HEENT:  [place]/AT,    NOSE-clear, THROAT-clear, no lesions, no postnasal drip or exudate noted.   NECK:  Supple w/ fair ROM; no JVD; normal carotid impulses w/o bruits; no thyromegaly or nodules palpated; no lymphadenopathy.    RESP  Clear  P & A; w/o, wheezes/ rales/ or rhonchi. no accessory muscle use, no  dullness to percussion  CARD:  RRR, no m/r/g, no peripheral edema, pulses intact, no cyanosis or clubbing.  GI:   Soft & nt; nml bowel sounds; no organomegaly or masses detected.   Musco: Warm bil, no deformities or joint swelling noted.   Neuro: alert, no focal deficits noted.    Skin: Warm, no lesions or rashes    Lab Results:  CBC No results found for: WBC, RBC, HGB, HCT, PLT, MCV, MCH, MCHC, RDW, LYMPHSABS, MONOABS, EOSABS, BASOSABS  BMET  BNP No results found for: BNP  ProBNP No results found for: PROBNP  Imaging: DG Chest Port 1 View  Result Date: 08/29/2020 CLINICAL DATA:  59 year old female with shortness of breath and productive cough. Smoker. EXAM: PORTABLE CHEST 1 VIEW COMPARISON:  Chest CT 11/09/2019 and earlier. FINDINGS: Portable AP upright view at 2056 hours. Chronically large lung volumes. Mediastinal contours remain normal. Visualized tracheal air column is within normal limits. Mildly symmetric increased interstitial markings. Otherwise when allowing for portable technique both lungs appear clear. No pneumothorax or pleural effusion. No acute osseous abnormality identified. Negative visible bowel gas pattern. IMPRESSION: No acute cardiopulmonary abnormality. Chronically large lung volumes with mildly increased interstitial markings, favor smoking related. Electronically Signed   By: Genevie Ann M.D.   On: 08/29/2020 21:12   DG Hand  Complete Left  Result Date: 08/28/2020 CLINICAL DATA:  Left middle finger injury, crushed within a cedar chest EXAM: LEFT HAND - COMPLETE 3+ VIEW COMPARISON:  None. FINDINGS: There is a tiny crescentic radiodensity seen along the volar aspect of the third middle phalangeal base which could reflect a small minimally displaced fracture with extension into the proximal interphalangeal joint. Additional cortical step-off seen along the ulnar aspect of the base of the third middle phalanx as well. Focal soft tissue swelling is noted at this level. No soft tissue gas or foreign body. There is a background of diffuse osteoarthrosis most pronounced at the first carpometacarpal and triscaphe joints as well as at the distal pole scaphoid and diffusely through the interphalangeal joints with periarticular osteophyte formation including likely remote corticated fragmented osteophytes along the dorsal base of the first distal phalanx and head of the fifth middle phalanx though could correlate for point tenderness at these locations. IMPRESSION: 1. Suspect tiny minimally displaced, comminuted fracture of the third middle phalangeal base with extension into the proximal interphalangeal joint. 2. Background of diffuse osteoarthrosis, as above. 3. Several fragmented osteophytes are present at the first distal phalanx and fifth middle phalanx, likely remote though could correlate for point tenderness at these locations as well. Electronically Signed   By: Lovena Le M.D.   On: 08/28/2020 23:52    methylPREDNISolone acetate (DEPO-MEDROL) injection 80 mg    Date Action Dose Route User   09/07/2020 1206 Given  Intramuscular (Left Upper Outer Quadrant) Merrilee Seashore, RN      No flowsheet data found.  No results found for: NITRICOXIDE      Assessment & Plan:   COPD with acute exacerbation ([place]) Recent slow to resolve COPD exacerbation now improving after course of antibiotics and steroids. Patient is  encouraged on smoking cessation.  Previous chest x-ray earlier this month showed some mildly increased interstitial markings.  Will repeat chest x-ray. We will get full set of PFTs on return. Also will get lab work on return check alpha-1 and CBC with differential as she has a history of allergies.  Looking at her eosinophils.  She just finished prednisone today so would  like to wait until she has been off of prednisone for a few days.  Plan  Patient Instructions  Continue on BREZTRI 2 puffs Twice daily , rinse after use.  Work on not smoking  Albuterol inhaler /nebs As needed  Wheezing .  Mucinex DM Twice daily  As needed  Cough/congestion  Follow up with Dr. Halford Chessman  Or Aadhya Bustamante NP in 6 weeks with PFT and labs.  Please contact office for sooner follow up if symptoms do not improve or worsen or seek emergency care          Rexene Edison, NP 09/14/2020

## 2020-09-15 NOTE — Telephone Encounter (Signed)
Auth# not req for pt insurance. Ref# [id number] [place]

## 2020-09-29 ENCOUNTER — Other Ambulatory Visit: Payer: Self-pay

## 2020-09-29 ENCOUNTER — Ambulatory Visit (INDEPENDENT_AMBULATORY_CARE_PROVIDER_SITE_OTHER): Payer: 59 | Admitting: Physical Medicine and Rehabilitation

## 2020-09-29 ENCOUNTER — Encounter: Payer: Self-pay | Admitting: Physical Medicine and Rehabilitation

## 2020-09-29 ENCOUNTER — Ambulatory Visit: Payer: Self-pay

## 2020-09-29 VITALS — BP 112/69 | HR 87

## 2020-09-29 DIAGNOSIS — M47812 Spondylosis without myelopathy or radiculopathy, cervical region: Secondary | ICD-10-CM | POA: Diagnosis not present

## 2020-09-29 MED ORDER — METHYLPREDNISOLONE ACETATE 80 MG/ML IJ SUSP
80.0000 mg | Freq: Once | INTRAMUSCULAR | Status: AC
  Administered 2020-09-29: 80 mg

## 2020-09-29 MED ORDER — BUPIVACAINE HCL 0.25 % IJ SOLN
2.0000 mL | Freq: Once | INTRAMUSCULAR | Status: AC
  Administered 2020-09-29: 2 mL

## 2020-09-29 NOTE — Progress Notes (Signed)
Pt state neck pain. Pt state getting her hair wash and turning her head makes the pain worse. Pt state she take pain meds to help ease the pain.  Numeric Pain Rating Scale and Functional Assessment Average Pain 4   In the last MONTH (on 0-10 scale) has pain interfered with the following?  1. General activity like being  able to carry out your everyday physical activities such as walking, climbing stairs, carrying groceries, or moving a chair?  Rating(5)   +Driver, -BT, -Dye Allergies.

## 2020-10-07 ENCOUNTER — Telehealth: Payer: Self-pay

## 2020-10-07 ENCOUNTER — Other Ambulatory Visit: Payer: Self-pay | Admitting: Physical Medicine and Rehabilitation

## 2020-10-07 DIAGNOSIS — M7918 Myalgia, other site: Secondary | ICD-10-CM

## 2020-10-07 DIAGNOSIS — M47812 Spondylosis without myelopathy or radiculopathy, cervical region: Secondary | ICD-10-CM

## 2020-10-07 DIAGNOSIS — M542 Cervicalgia: Secondary | ICD-10-CM

## 2020-10-07 NOTE — Telephone Encounter (Signed)
Pt sates that the inj was good. Her left side has a little aches and soreness but the inj helped. Pt feel soreness when turning her head and neck but she doesn't feel pain. Pt asked about dry message (she might meant dry needles) that you may have mention to her.

## 2020-10-07 NOTE — Telephone Encounter (Signed)
I sent in referral to PT for manual therapy and dry needling, they will call to schedule

## 2020-10-20 ENCOUNTER — Other Ambulatory Visit: Payer: Self-pay

## 2020-10-20 ENCOUNTER — Encounter (HOSPITAL_COMMUNITY): Payer: Self-pay | Admitting: Physical Therapy

## 2020-10-20 ENCOUNTER — Ambulatory Visit (HOSPITAL_COMMUNITY): Payer: 59 | Attending: Orthopaedic Surgery | Admitting: Physical Therapy

## 2020-10-20 DIAGNOSIS — R293 Abnormal posture: Secondary | ICD-10-CM | POA: Diagnosis present

## 2020-10-20 DIAGNOSIS — M542 Cervicalgia: Secondary | ICD-10-CM | POA: Insufficient documentation

## 2020-10-20 NOTE — Therapy (Signed)
[place], [place], [address] Phone: [phone]   Fax:  [phone]  Physical Therapy Evaluation  Patient Details  Name: Christina Esparza MRN: [id number] Date of Birth: 1961/04/16 Referring Provider (PT): Joni Fears MD    Encounter Date: 10/20/2020   PT End of Session - 10/20/20 1112    Visit Number 1    Number of Visits 4    Date for PT Re-Evaluation 11/18/20    Authorization Type Generic [employer] (no auth, no VL)    PT Start Time 1030    PT Stop Time 1115    PT Time Calculation (min) 45 min    Activity Tolerance Patient tolerated treatment well    Behavior During Therapy [facility] for tasks assessed/performed           Past Medical History:  Diagnosis Date  . Anxiety   . Depression     Past Surgical History:  Procedure Laterality Date  . COLONOSCOPY N/A 06/17/2013   Procedure: COLONOSCOPY;  Surgeon: Rogene Houston, MD;  [facility];  Service: Endoscopy;  Laterality: N/A;  730  . ESOPHAGEAL BRUSHING  11/18/2019   Procedure: ESOPHAGEAL BRUSHING;  Surgeon: Rogene Houston, MD;  [facility];  Service: Endoscopy;;  . ESOPHAGEAL DILATION N/A 11/15/2015   Procedure: ESOPHAGEAL DILATION;  Surgeon: Rogene Houston, MD;  [facility];  Service: Endoscopy;  Laterality: N/A;  . ESOPHAGOGASTRODUODENOSCOPY N/A 11/15/2015   Procedure: ESOPHAGOGASTRODUODENOSCOPY (EGD);  Surgeon: Rogene Houston, MD;  [facility];  Service: Endoscopy;  Laterality: N/A;  . ESOPHAGOGASTRODUODENOSCOPY N/A 11/18/2019   Procedure: ESOPHAGOGASTRODUODENOSCOPY (EGD);  Surgeon: Rogene Houston, MD;  [facility];  Service: Endoscopy;  Laterality: N/A;  2:25  . NASAL SEPTOPLASTY W/ TURBINOPLASTY Bilateral 11/18/2018   Procedure: NASAL SEPTOPLASTY WITH BILATERAL TURBINATE REDUCTION;  Surgeon: Leta Baptist, MD;  Location: [facility];  Service: ENT;  Laterality: Bilateral;  .  TUBAL LIGATION      There were no vitals filed for this visit.    Subjective Assessment - 10/20/20 1038    Subjective Patient presents to therapy with complaint of chronic neck pain. She says she has been diagnosed with arthritis all throughout her cervical spine. She reports history of ear and neck problems. She says she was recently sent to [facility] for injections in her neck which she says has been helpful. Has tried chiropractic in the past but this was not effective. Denies talking current medication for neck pain. Feeling much better currently, not sure about initiating therapy POC vs HEP.    Pertinent History arthritis    Limitations Sitting;House hold activities    Diagnostic tests MRI    Patient Stated Goals Keep neck feeling better    Currently in Pain? No/denies              [facility] PT Assessment - 10/20/20 0001      Assessment   Medical Diagnosis Spondylosis of the cervical region     Referring Provider (PT) Joni Fears MD     Onset Date/Surgical Date --   Chronic    Next MD Visit --   None scheduled    Prior Therapy No       Precautions   Precautions None      Restrictions   Weight Bearing Restrictions No      Balance Screen   Has the patient fallen in the past 6 months No  Home Environment   Living Environment Private residence    Living Arrangements Spouse/significant other      Prior Function   Level of Independence Independent      Cognition   Overall Cognitive Status Within Functional Limits for tasks assessed      Observation/Other Assessments   Focus on Therapeutic Outcomes (FOTO)  Complete at next session       Sensation   Light Touch Appears Intact      Posture/Postural Control   Posture/Postural Control Postural limitations    Postural Limitations Rounded Shoulders;Forward head      ROM / Strength   AROM / PROM / Strength AROM;Strength      AROM   Overall AROM Comments Gross shoulder ROM WFL     AROM Assessment Site Cervical     Cervical Flexion 35    Cervical Extension 29    Cervical - Right Side Bend 18    Cervical - Left Side Bend 12    Cervical - Right Rotation 55    Cervical - Left Rotation 55      Strength   Strength Assessment Site Shoulder;Elbow    Right/Left Shoulder Right;Left    Right Shoulder Flexion 4+/5    Right Shoulder ABduction 4+/5    Right Shoulder Internal Rotation 5/5    Right Shoulder External Rotation 4+/5    Left Shoulder Flexion 4+/5    Left Shoulder ABduction 4+/5    Left Shoulder Internal Rotation 5/5    Left Shoulder External Rotation 4+/5    Right/Left Elbow Left;Right    Right Elbow Flexion 5/5    Right Elbow Extension 5/5    Left Elbow Flexion 5/5    Left Elbow Extension 5/5      Flexibility   Soft Tissue Assessment /Muscle Length --   Mod restriction in bilateral upper trap      Palpation   Palpation comment Mod TTP about bilateral upper trap and levator                      Objective measurements completed on examination: See above findings.       [facility] PT Treatment/Exercise - 10/20/20 0001      Exercises   Exercises Neck      Neck Exercises: Seated   Neck Retraction 10 reps    Other Seated Exercise Scap retraction 10 x 5"       Neck Exercises: Stretches   Upper Trapezius Stretch Right;Left;2 reps;30 seconds                  PT Education - 10/20/20 1045    Education Details on evaluation findings, POC, dry needling education (application, precautions and contraindications) and HEP    Person(s) Educated Patient    Methods Explanation;Handout    Comprehension Verbalized understanding            PT Short Term Goals - 10/20/20 1229      PT SHORT TERM GOAL #1   Title Patient will be independent with initial HEP and self-management strategies to improve functional outcomes    Time 2    Period Weeks    Status New    Target Date 11/04/20             PT Long Term Goals - 10/20/20 1229      PT LONG TERM GOAL #1    Title Patient will improve FOTO score by 5% to indicate improvement in functional outcomes  Time 4    Period Weeks    Status New    Target Date 11/18/20      PT LONG TERM GOAL #2   Title Patient will report at least 70% overall improvement in subjective complaint to indicate improvement in ability to perform ADLs.    Time 4    Period Weeks    Status New    Target Date 11/18/20      PT LONG TERM GOAL #3   Title Patient will be independent with final HEP and self-management strategies, and demo improved postural awareness for improved functional outcomes and QOL    Time 4    Period Weeks    Status New    Target Date 11/18/20                  Plan - 10/20/20 1226    Clinical Impression Statement Patient is a 59 y.o. female who presents to physical therapy with complaint of neck pain. Patient demonstrates decreased strength, ROM restriction, reduced flexibility, increased tenderness to palpation and postural abnormalities which are likely contributing to symptoms of pain and are negatively impacting patient ability to perform ADLs. Patient will benefit from skilled physical therapy services to address these deficits to reduce pain and improve level of function with ADLs    Personal Factors and Comorbidities Time since onset of injury/illness/exacerbation    Examination-Activity Limitations Carry;Lift;Sit    Examination-Participation Restrictions Cleaning;Laundry;Community Activity    Stability/Clinical Decision Making Stable/Uncomplicated    Clinical Decision Making Low    Rehab Potential Good    PT Frequency 1x / week    PT Duration 4 weeks    PT Treatment/Interventions ADLs/Self Care Home Management;Aquatic Therapy;Biofeedback;Cryotherapy;Ultrasound;Traction;Functional mobility training;Neuromuscular re-education;Patient/family education;Orthotic Fit/Training;Compression bandaging;Scar mobilization;Passive range of motion;Dry needling;Manual techniques;Manual lymph  drainage;Energy conservation;Therapeutic activities;Parrafin;Fluidtherapy;Moist Heat;Iontophoresis 4mg /ml Dexamethasone;Stair training;Gait training;DME Instruction;Contrast Bath;Electrical Stimulation;Therapeutic exercise;Balance training;Taping;Joint Manipulations;Vasopneumatic Device;Vestibular;Visual/perceptual remediation/compensation;Spinal Manipulations;Splinting    PT Next Visit Plan Complete FOTO. Review goals and HEP. Dry needling if indicated. Progress postural strengthening and cervical mobility exercise as tolerated    PT Home Exercise Plan Eval: chin tuck, scap retraction, upper trap stretch    Consulted and Agree with Plan of Care Patient           Patient will benefit from skilled therapeutic intervention in order to improve the following deficits and impairments:  Pain, Improper body mechanics, Increased fascial restricitons, Impaired flexibility, Decreased range of motion, Hypomobility, Postural dysfunction, Decreased mobility  Visit Diagnosis: Cervicalgia  Abnormal posture     Problem List Patient Active Problem List   Diagnosis Date Noted  . COPD with acute exacerbation ([place]) 09/14/2020  . 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 Gastroesophageal reflux disease 10/22/2019  . GERD (gastroesophageal reflux disease) 10/21/2019  . Shortness of breath 10/21/2019  . Anxiety state 08/24/2015   12:33 PM, 10/20/20 Josue Hector PT DPT  Physical Therapist with [facility]  [phone] [place] [address] [place], [place], [address] Phone: [phone]   Fax:  [phone]  Name: Christina Esparza MRN: [id number] Date of Birth: 29-Jan-1961

## 2020-10-20 NOTE — Patient Instructions (Addendum)
Trigger Point Dry Needling  . What is Trigger Point Dry Needling (DN)? o DN is a physical therapy technique used to treat muscle pain and dysfunction. Specifically, DN helps deactivate muscle trigger points (muscle knots).  o A thin filiform needle is used to penetrate the skin and stimulate the underlying trigger point. The goal is for a local twitch response (LTR) to occur and for the trigger point to relax. No medication of any kind is injected during the procedure.   . What Does Trigger Point Dry Needling Feel Like?  o The procedure feels different for each individual patient. Some patients report that they do not actually feel the needle enter the skin and overall the process is not painful. Very mild bleeding may occur. However, many patients feel a deep cramping in the muscle in which the needle was inserted. This is the local twitch response.   Marland Kitchen How Will I feel after the treatment? o Soreness is normal, and the onset of soreness may not occur for a few hours. Typically this soreness does not last longer than two days.  o Bruising is uncommon, however; ice can be used to decrease any possible bruising.  o In rare cases feeling tired or nauseous after the treatment is normal. In addition, your symptoms may get worse before they get better, this period will typically not last longer than 24 hours.   . What Can I do After My Treatment? o Increase your hydration by drinking more water for the next 24 hours. o You may place ice or heat on the areas treated that have become sore, however, do not use heat on inflamed or bruised areas. Heat often brings more relief post needling. o You can continue your regular activities, but vigorous activity is not recommended initially after the treatment for 24 hours. o DN is best combined with other physical therapy such as strengthening, stretching, and other therapies.    Access Code: [email or web address] URL: [email or web address] Date:  10/20/2020 Prepared by: Josue Hector  Exercises Seated Cervical Retraction - 2-3 x daily - 7 x weekly - 2 sets - 10 reps - 3 second hold Seated Scapular Retraction - 2-3 x daily - 7 x weekly - 2 sets - 10 reps - 3 second hold Seated Upper Trap Stretch - 2-3 x daily - 7 x weekly - 1 sets - 3 reps - 30 second hold

## 2020-10-21 ENCOUNTER — Telehealth: Payer: Self-pay | Admitting: Physical Medicine and Rehabilitation

## 2020-10-21 NOTE — Telephone Encounter (Signed)
Patient called needing to schedule an appointment with Dr. Ernestina Patches. The  Number to contact patient is [phone]

## 2020-10-24 ENCOUNTER — Encounter (HOSPITAL_COMMUNITY): Payer: Self-pay | Admitting: Physical Therapy

## 2020-10-24 ENCOUNTER — Encounter: Payer: Self-pay | Admitting: Physical Medicine and Rehabilitation

## 2020-10-24 ENCOUNTER — Ambulatory Visit (HOSPITAL_COMMUNITY): Payer: 59 | Admitting: Physical Therapy

## 2020-10-24 ENCOUNTER — Other Ambulatory Visit: Payer: Self-pay

## 2020-10-24 DIAGNOSIS — M542 Cervicalgia: Secondary | ICD-10-CM | POA: Diagnosis not present

## 2020-10-24 DIAGNOSIS — R293 Abnormal posture: Secondary | ICD-10-CM

## 2020-10-24 NOTE — Progress Notes (Signed)
Rashel Okeefe Pakula - 59 y.o. female MRN [id number]  Date of birth: [birth year]  Office Visit Note: Visit Date: 09/29/2020 PCP: Sharilyn Sites, MD Referred by: Sharilyn Sites, MD  Subjective: Chief Complaint  Patient presents with  . Neck - Pain   HPI:  STEFANNY PIERI is a 59 y.o. female who comes in today at the request of Dr. Joni Fears for planned Bilateral  C3-4, C4-5 and C5-6 Cervical facet/medial branch block with fluoroscopic guidance.  The patient has failed conservative care including home exercise, medications, time and activity modification.  This injection will be diagnostic and hopefully therapeutic.  Please see requesting physician notes for further details and justification.  Exam has shown concordant pain with facet joint loading.   ROS Otherwise per HPI.  Assessment & Plan: Visit Diagnoses:    ICD-10-CM   1. Cervical spondylosis without myelopathy  M47.812 XR C-ARM NO REPORT    Facet Injection    bupivacaine (MARCAINE) 0.25 % (with pres) injection 2 mL    methylPREDNISolone acetate (DEPO-MEDROL) injection 80 mg    Plan: No additional findings.   Meds & Orders:  Meds ordered this encounter  Medications  . bupivacaine (MARCAINE) 0.25 % (with pres) injection 2 mL  . methylPREDNISolone acetate (DEPO-MEDROL) injection 80 mg    Orders Placed This Encounter  Procedures  . Facet Injection  . XR C-ARM NO REPORT    Follow-up: Return for Review Pain Diary.   Procedures: No procedures performed  Diagnostic Cervical Facet Joint Nerve Block with Fluoroscopic Guidance  Patient: LADON HENEY      Date of Birth: 1961/03/28 MRN: [id number] PCP: Sharilyn Sites, MD      Visit Date: 09/29/2020   Universal Protocol:    Date/Time: 12/06/215:54 AM  Consent Given By: the patient  Position: PRONE  Additional Comments: Vital signs were monitored before and after the procedure. Patient was prepped and draped in the usual sterile fashion. The correct patient,  procedure, and site was verified.   Injection Procedure Details:   Procedure diagnoses: Cervical spondylosis without myelopathy [M47.812]   Meds Administered:  Meds ordered this encounter  Medications  . bupivacaine (MARCAINE) 0.25 % (with pres) injection 2 mL  . methylPREDNISolone acetate (DEPO-MEDROL) injection 80 mg     Laterality: Bilateral  Location/Site:  C3-4 C4-5 C5-6  Needle size: 25 G  Needle type: Spinal  Needle Placement: Articular Pillar  Findings:  -Contrast Used: 0.5 mL iohexol 180 mg iodine/mL   -Comments: Excellent flow of contrast across the articular pillars without intravascular flow  Procedure Details: The fluoroscope beam was positioned to square off the endplates of the desired vertebral level to achieve a true AP position. The beam was then moved in a small "counter" oblique to the contralateral side with a small amount of caudal tilt to achieve a trajectory alignment with the desired nerves.  For each target described below the skin was anesthetized with 1 ml of 1% Lidocaine without epinephrine.   To block the facet joint nerves from C3 through C7, the lateral masses of these respective levels were localized under fluoroscopic visualization.  A spinal needle was inserted down to the "waist" at the above mentioned cervical levels.  The  needle was then "walked off" until it rested just lateral to the trough of the lateral mass of the medial branch nerve, which innervates the cervical facet joint.  After contact with periosteum and negative aspirate for blood and CSF, correct placement without intravascular or epidural spread was  confirmed by Bi-planar images and  injecting 0.5 ml. of Omnipaque-240.  A spot radiograph was obtained of this image.  Next, a 0.5 ml. volume of 1% Lidocaine without Epinephrine was then injected.  Prior to the procedure, the patient was given a Pain Diary which was completed for baseline measurements.  After the procedure, the  patient rated their pain every 30 minutes and will continue rating at this frequency for a total of 5 hours.  The patient has been asked to complete the Diary and return to [place] by mail, fax or hand delivered as soon as possible.   Additional Comments:  The patient tolerated the procedure well Dressing: Band-Aid    Post-procedure details: Patient was observed during the procedure. Post-procedure instructions were reviewed.  Patient left the clinic in stable condition.       Clinical History: MRI CERVICAL SPINE WITHOUT CONTRAST  TECHNIQUE: Multiplanar, multisequence MR imaging of the cervical spine was performed. No intravenous contrast was administered.  COMPARISON:  None.  FINDINGS: Alignment: 3 mm anterolisthesis C4-5. 2 mm anterolisthesis C5-6. 1 mm anterolisthesis C7-T1 and T1-2.  Vertebrae: Negative for fracture or mass.  Normal bone marrow.  Cord: Normal signal and morphology  Posterior Fossa, vertebral arteries, paraspinal tissues: Negative  Disc levels:  C2-3: Small central disc protrusion. Mild facet degeneration. Negative for stenosis  C3-4: Disc degeneration with mild uncinate spurring. Moderate facet degeneration bilaterally. Mild foraminal narrowing bilaterally  C4-5: 3 mm anterolisthesis. Severe facet degeneration bilaterally. Mild to moderate foraminal stenosis bilaterally.  C5-6: 2 mm anterolisthesis with bilateral facet degeneration. No significant spinal or foraminal stenosis.  C6-7: Normal disc space.  Mild facet degeneration without stenosis.  C7-T1: Mild anterolisthesis with mild facet degeneration. Negative for stenosis.  IMPRESSION: Multilevel degenerative change in the cervical spine with prominent facet degeneration at multiple levels. Mild foraminal narrowing bilaterally at C3-4 and moderate foraminal narrowing bilaterally at C4-5 due to facet degeneration and spurring.   Electronically Signed   By: Franchot Gallo  M.D.   On: 07/20/2020 14:54     Objective:  VS:  HT:    WT:   BMI:     BP:112/69  HR:87bpm  TEMP: ( )  RESP:  Physical Exam Vitals and nursing note reviewed.  Constitutional:      General: She is not in acute distress.    Appearance: Normal appearance. She is not ill-appearing.  HENT:     Head: Normocephalic and atraumatic.     Right Ear: External ear normal.     Left Ear: External ear normal.  Eyes:     Extraocular Movements: Extraocular movements intact.  Cardiovascular:     Rate and Rhythm: Normal rate.     Pulses: Normal pulses.  Musculoskeletal:     Cervical back: Tenderness present. No rigidity.     Right lower leg: No edema.     Left lower leg: No edema.     Comments: Patient has good strength in the upper extremities including 5 out of 5 strength in wrist extension long finger flexion and APB.  There is no atrophy of the hands intrinsically.  There is a negative Hoffmann's test. Pain to palpation along the upper cervical paraspinal region and sternocleidomastoid.  Pain at end ranges of rotation left and right.  Lymphadenopathy:     Cervical: No cervical adenopathy.  Skin:    Findings: No erythema, lesion or rash.  Neurological:     General: No focal deficit present.     Mental Status: She  is alert and oriented to person, place, and time.     Sensory: No sensory deficit.     Motor: No weakness or abnormal muscle tone.     Coordination: Coordination normal.  Psychiatric:        Mood and Affect: Mood normal.        Behavior: Behavior normal.      Imaging: No results found.

## 2020-10-24 NOTE — Progress Notes (Signed)
Christina Esparza - 59 y.o. female MRN [id number]  Date of birth: [birth year]  Office Visit Note: Visit Date: 09/14/2020 PCP: Sharilyn Sites, MD Referred by: Sharilyn Sites, MD  Subjective: Chief Complaint  Patient presents with  . Neck - Pain   HPI: Christina Esparza is a 59 y.o. female who comes in today At the request of Dr. Joni Fears for evaluation management of chronic worsening severe neck pain and occipital head pain and bilateral ear pain.  She began seeing Dr. Durward Fortes for evaluation in June of this year.  Her history is quite extensive the many notes reviewed today from Dr. Durward Fortes as well as a primary care physician and otolaryngology notes.  She reports a pretty extensive history of having this pain which she rates as a 5 out of 10 which is an intermittent dull and aching pain in the back of the neck and the upper part of the cervical spine and behind the ears.  She constantly feels like her ears hurt and her ears are stopped up.  She has had at least 3 ENT surgeons evaluate her and state that there is really nothing wrong with her ears or any pathology rate leading to the ear nose and throat.  She denies any specific trauma she has no radicular problems no complaints down the arms no paresthesias.  It does really limit what she can do during the day and being able to enjoy life at this point as has been a chronic issue with no real relief.  She has had significant chiropractic care with acupuncture with no relief.  She has not had physical therapy with dry needling although she has started physical therapy at [facility].  Dr. Durward Fortes did obtain MRI of the cervical spine and this is reviewed with her with spine images and models.  She does have underlying diagnosis of depression anxiety but no history of fibromyalgia.  She does take Zoloft and bupropion as well as Xanax.  She does use some Flexeril for her neck.  She has been intolerant of a lot of medications.  MRI of the spine does  show moderate facet arthropathy with small bit of listhesis at C3-4, C4-5 and C5-6.  There is some by foraminal narrowing but again she has no radicular complaints.  Those facet joints at least in the upper part could refer pain somewhat cranially.  She has had no prior injections in the cervical spine.  No cervical spine surgery.  Review of Systems  HENT: Positive for ear pain.   Musculoskeletal: Positive for neck pain.  All other systems reviewed and are negative.  Otherwise per HPI.  Assessment & Plan: Visit Diagnoses:    ICD-10-CM   1. Cervicalgia  M54.2   2. Cervical spondylosis without myelopathy  M47.812   3. Myofascial pain syndrome  M79.18      Plan: Findings:  Chronic multiyear history of upper cervical pain and neck pain particularly with pain in the ears with a feeling of stuffiness in the ears.  No relief despite multiple trials of medication management chiropractic care and evaluations by ENT.  MRI with multilevel facet arthropathy and small listhesis but no high-grade nerve compression or central stenosis.  No radicular complaints down the arms.  Probably worth a try for medial branch blocks at C3-4 C4-5 and C5-6 and see how much relief she gets overall.  It may actually help her neck pain but not her ear pain and pressure or it could help all of  the above.  We answered multiple questions from her today and spent at least 40 minutes talking with her today.  Depending on relief with the medial branch blocks could look at radiofrequency ablation if it successful.  If not successful I think she should look at physical therapy with dry needling and see if some of this is myofascial pain related.  She could have some underlying central pain sensitization syndrome such as fibromyalgia.    Meds & Orders: No orders of the defined types were placed in this encounter.  No orders of the defined types were placed in this encounter.   Follow-up: Return for Bilateral medial branch blocks at  C3-4, C4-5 and C5-6.Marland Kitchen   Procedures: No procedures performed  No notes on file   Clinical History: MRI CERVICAL SPINE WITHOUT CONTRAST  TECHNIQUE: Multiplanar, multisequence MR imaging of the cervical spine was performed. No intravenous contrast was administered.  COMPARISON:  None.  FINDINGS: Alignment: 3 mm anterolisthesis C4-5. 2 mm anterolisthesis C5-6. 1 mm anterolisthesis C7-T1 and T1-2.  Vertebrae: Negative for fracture or mass.  Normal bone marrow.  Cord: Normal signal and morphology  Posterior Fossa, vertebral arteries, paraspinal tissues: Negative  Disc levels:  C2-3: Small central disc protrusion. Mild facet degeneration. Negative for stenosis  C3-4: Disc degeneration with mild uncinate spurring. Moderate facet degeneration bilaterally. Mild foraminal narrowing bilaterally  C4-5: 3 mm anterolisthesis. Severe facet degeneration bilaterally. Mild to moderate foraminal stenosis bilaterally.  C5-6: 2 mm anterolisthesis with bilateral facet degeneration. No significant spinal or foraminal stenosis.  C6-7: Normal disc space.  Mild facet degeneration without stenosis.  C7-T1: Mild anterolisthesis with mild facet degeneration. Negative for stenosis.  IMPRESSION: Multilevel degenerative change in the cervical spine with prominent facet degeneration at multiple levels. Mild foraminal narrowing bilaterally at C3-4 and moderate foraminal narrowing bilaterally at C4-5 due to facet degeneration and spurring.   Electronically Signed   By: Franchot Gallo M.D.   On: 07/20/2020 14:54   She reports that she has been smoking cigarettes. She has a 20.00 pack-year smoking history. She has never used smokeless tobacco. No results for input(s): HGBA1C, LABURIC in the last 8760 hours.  Objective:  VS:  HT:    WT:   BMI:     BP:115/72  HR:89bpm  TEMP: ( )  RESP:  Physical Exam Vitals and nursing note reviewed.  Constitutional:      General: She is  not in acute distress.    Appearance: Normal appearance. She is not ill-appearing.  HENT:     Head: Normocephalic and atraumatic.     Right Ear: External ear normal.     Left Ear: External ear normal.  Eyes:     Extraocular Movements: Extraocular movements intact.  Cardiovascular:     Rate and Rhythm: Normal rate.     Pulses: Normal pulses.  Pulmonary:     Effort: Pulmonary effort is normal.     Breath sounds: No wheezing.  Abdominal:     General: There is no distension.     Palpations: Abdomen is soft.  Musculoskeletal:        General: Tenderness present.     Cervical back: Tenderness present. No rigidity.     Right lower leg: No edema.     Left lower leg: No edema.     Comments: Patient sits with forward flexed cervical spine.  She does have some pain with forward flexion more than extension but some pain with extension rotation at end ranges right and left.  Patient has good strength in the upper extremities including 5 out of 5 strength in wrist extension long finger flexion and APB.  There is no atrophy of the hands intrinsically.  There is a negative Hoffmann's test.  Focally she does have some tenderness at the sternocleidomastoid insertion at the mastoid.  She has some focal trigger points in the paraspinal musculature.  No pain over the occipital grooves.   Lymphadenopathy:     Cervical: No cervical adenopathy.  Skin:    Findings: No erythema, lesion or rash.  Neurological:     General: No focal deficit present.     Mental Status: She is alert and oriented to person, place, and time.     Sensory: No sensory deficit.     Motor: No weakness or abnormal muscle tone.     Coordination: Coordination normal.     Gait: Gait normal.  Psychiatric:        Mood and Affect: Mood normal.        Behavior: Behavior normal.     Ortho Exam  Imaging: No results found.  Past Medical/Family/Surgical/Social History: Medications & Allergies reviewed per EMR, new medications  updated. Patient Active Problem List   Diagnosis Date Noted  . COPD with acute exacerbation ([place]) 09/14/2020  . 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 Gastroesophageal reflux disease 10/22/2019  . GERD (gastroesophageal reflux disease) 10/21/2019  . Shortness of breath 10/21/2019  . Anxiety state 08/24/2015   Past Medical History:  Diagnosis Date  . Anxiety   . Depression    Family History  Problem Relation Age of Onset  . Colon polyps Mother   . Prostate cancer Father   . Colon cancer Neg Hx    Past Surgical History:  Procedure Laterality Date  . COLONOSCOPY N/A 06/17/2013   Procedure: COLONOSCOPY;  Surgeon: Rogene Houston, MD;  [facility];  Service: Endoscopy;  Laterality: N/A;  730  . ESOPHAGEAL BRUSHING  11/18/2019   Procedure: ESOPHAGEAL BRUSHING;  Surgeon: Rogene Houston, MD;  [facility];  Service: Endoscopy;;  . ESOPHAGEAL DILATION N/A 11/15/2015   Procedure: ESOPHAGEAL DILATION;  Surgeon: Rogene Houston, MD;  [facility];  Service: Endoscopy;  Laterality: N/A;  . ESOPHAGOGASTRODUODENOSCOPY N/A 11/15/2015   Procedure: ESOPHAGOGASTRODUODENOSCOPY (EGD);  Surgeon: Rogene Houston, MD;  [facility];  Service: Endoscopy;  Laterality: N/A;  . ESOPHAGOGASTRODUODENOSCOPY N/A 11/18/2019   Procedure: ESOPHAGOGASTRODUODENOSCOPY (EGD);  Surgeon: Rogene Houston, MD;  [facility];  Service: Endoscopy;  Laterality: N/A;  2:25  . NASAL SEPTOPLASTY W/ TURBINOPLASTY Bilateral 11/18/2018   Procedure: NASAL SEPTOPLASTY WITH BILATERAL TURBINATE REDUCTION;  Surgeon: Leta Baptist, MD;  Location: [place];  Service: ENT;  Laterality: Bilateral;  . TUBAL LIGATION     Social History   Occupational History  . Not on file  Tobacco Use  . Smoking status: Current Every Day Smoker    Packs/day: 1.00    Years: 20.00    Pack years: 20.00    Types: Cigarettes  . Smokeless tobacco: Never Used  . Tobacco comment:  smokes 4-5 cigarettes per day 10/20/201  Vaping Use  . Vaping Use: Never used  Substance and Sexual Activity  . Alcohol use: No    Alcohol/week: 0.0 standard drinks  . Drug use: No  . Sexual activity: Not on file

## 2020-10-24 NOTE — Procedures (Signed)
Diagnostic Cervical Facet Joint Nerve Block with Fluoroscopic Guidance  Patient: Christina Esparza      Date of Birth: 12/15/1960 MRN: [id number] PCP: Sharilyn Sites, MD      Visit Date: 09/29/2020   Universal Protocol:    Date/Time: 12/06/215:54 AM  Consent Given By: the patient  Position: PRONE  Additional Comments: Vital signs were monitored before and after the procedure. Patient was prepped and draped in the usual sterile fashion. The correct patient, procedure, and site was verified.   Injection Procedure Details:   Procedure diagnoses: Cervical spondylosis without myelopathy [M47.812]   Meds Administered:  Meds ordered this encounter  Medications  . bupivacaine (MARCAINE) 0.25 % (with pres) injection 2 mL  . methylPREDNISolone acetate (DEPO-MEDROL) injection 80 mg     Laterality: Bilateral  Location/Site:  C3-4 C4-5 C5-6  Needle size: 25 G  Needle type: Spinal  Needle Placement: Articular Pillar  Findings:  -Contrast Used: 0.5 mL iohexol 180 mg iodine/mL   -Comments: Excellent flow of contrast across the articular pillars without intravascular flow  Procedure Details: The fluoroscope beam was positioned to square off the endplates of the desired vertebral level to achieve a true AP position. The beam was then moved in a small "counter" oblique to the contralateral side with a small amount of caudal tilt to achieve a trajectory alignment with the desired nerves.  For each target described below the skin was anesthetized with 1 ml of 1% Lidocaine without epinephrine.   To block the facet joint nerves from C3 through C7, the lateral masses of these respective levels were localized under fluoroscopic visualization.  A spinal needle was inserted down to the "waist" at the above mentioned cervical levels.  The  needle was then "walked off" until it rested just lateral to the trough of the lateral mass of the medial branch nerve, which innervates the cervical facet  joint.  After contact with periosteum and negative aspirate for blood and CSF, correct placement without intravascular or epidural spread was confirmed by Bi-planar images and  injecting 0.5 ml. of Omnipaque-240.  A spot radiograph was obtained of this image.  Next, a 0.5 ml. volume of 1% Lidocaine without Epinephrine was then injected.  Prior to the procedure, the patient was given a Pain Diary which was completed for baseline measurements.  After the procedure, the patient rated their pain every 30 minutes and will continue rating at this frequency for a total of 5 hours.  The patient has been asked to complete the Diary and return to [place] by mail, fax or hand delivered as soon as possible.   Additional Comments:  The patient tolerated the procedure well Dressing: Band-Aid    Post-procedure details: Patient was observed during the procedure. Post-procedure instructions were reviewed.  Patient left the clinic in stable condition.

## 2020-10-24 NOTE — Telephone Encounter (Signed)
Pt is coming in for a OV-left hip-Consults  11/29/20.

## 2020-10-24 NOTE — Therapy (Signed)
[place], [place], [address] Phone: [phone]   Fax:  [phone]  Physical Therapy Treatment  Patient Details  Name: Christina Esparza MRN: [id number] Date of Birth: 06/02/1961 Referring Provider (PT): Joni Fears MD    Encounter Date: 10/24/2020   PT End of Session - 10/24/20 1308    Visit Number 2    Number of Visits 4    Date for PT Re-Evaluation 11/18/20    Authorization Type Generic [employer] (no auth, no VL)    PT Start Time 1303    PT Stop Time 1400    PT Time Calculation (min) 57 min    Activity Tolerance Patient tolerated treatment well    Behavior During Therapy [facility] for tasks assessed/performed           Past Medical History:  Diagnosis Date  . Anxiety   . Depression     Past Surgical History:  Procedure Laterality Date  . COLONOSCOPY N/A 06/17/2013   Procedure: COLONOSCOPY;  Surgeon: Rogene Houston, MD;  [facility];  Service: Endoscopy;  Laterality: N/A;  730  . ESOPHAGEAL BRUSHING  11/18/2019   Procedure: ESOPHAGEAL BRUSHING;  Surgeon: Rogene Houston, MD;  [facility];  Service: Endoscopy;;  . ESOPHAGEAL DILATION N/A 11/15/2015   Procedure: ESOPHAGEAL DILATION;  Surgeon: Rogene Houston, MD;  [facility];  Service: Endoscopy;  Laterality: N/A;  . ESOPHAGOGASTRODUODENOSCOPY N/A 11/15/2015   Procedure: ESOPHAGOGASTRODUODENOSCOPY (EGD);  Surgeon: Rogene Houston, MD;  [facility];  Service: Endoscopy;  Laterality: N/A;  . ESOPHAGOGASTRODUODENOSCOPY N/A 11/18/2019   Procedure: ESOPHAGOGASTRODUODENOSCOPY (EGD);  Surgeon: Rogene Houston, MD;  [facility];  Service: Endoscopy;  Laterality: N/A;  2:25  . NASAL SEPTOPLASTY W/ TURBINOPLASTY Bilateral 11/18/2018   Procedure: NASAL SEPTOPLASTY WITH BILATERAL TURBINATE REDUCTION;  Surgeon: Leta Baptist, MD;  Location: [place];  Service: ENT;  Laterality: Bilateral;  . TUBAL  LIGATION      There were no vitals filed for this visit.   Subjective Assessment - 10/24/20 1307    Subjective Patient reports compliance with HEP. Says her muscles were a little sore and tender. Says her neck is a little sore and tender today.    Pertinent History arthritis    Limitations Sitting;House hold activities    Diagnostic tests MRI    Patient Stated Goals Keep neck feeling better    Currently in Pain? No/denies   "just tender"             [facility] PT Assessment - 10/24/20 0001      Observation/Other Assessments   Focus on Therapeutic Outcomes (FOTO)  41% limited                          [facility] Adult PT Treatment/Exercise - 10/24/20 0001      Neck Exercises: Seated   Cervical Isometrics Right lateral flexion;Left lateral flexion;5 secs;5 reps    Neck Retraction 15 reps    Other Seated Exercise Scap retraction 15 x 5"       Manual Therapy   Manual Therapy Soft tissue mobilization    Manual therapy comments Manuals performed separate from all other activity     Soft tissue mobilization STM to bilateral upper trap, with trigger point identification and muscle area preparation (positioning, cleaning) pre dry needling and STM post dry needling for pain and restriction  Neck Exercises: Stretches   Upper Trapezius Stretch Right;Left;2 reps;30 seconds    Levator Stretch Right;Left;2 reps;30 seconds            Trigger Point Dry Needling - 10/24/20 0001    Consent Given? Yes    Education Handout Provided Previously provided    Muscles Treated Head and Neck Upper trapezius    Dry Needling Comments tolerated well     Other Dry Needling 1 needle to bilateral upper trapezius ( 1 x each) pistoning, with patient in prone    Upper Trapezius Response Twitch reponse elicited;Palpable increased muscle length                  PT Short Term Goals - 10/20/20 1229      PT SHORT TERM GOAL #1   Title Patient will be independent with initial HEP and  self-management strategies to improve functional outcomes    Time 2    Period Weeks    Status New    Target Date 11/04/20             PT Long Term Goals - 10/20/20 1229      PT LONG TERM GOAL #1   Title Patient will improve FOTO score by 5% to indicate improvement in functional outcomes    Time 4    Period Weeks    Status New    Target Date 11/18/20      PT LONG TERM GOAL #2   Title Patient will report at least 70% overall improvement in subjective complaint to indicate improvement in ability to perform ADLs.    Time 4    Period Weeks    Status New    Target Date 11/18/20      PT LONG TERM GOAL #3   Title Patient will be independent with final HEP and self-management strategies, and demo improved postural awareness for improved functional outcomes and QOL    Time 4    Period Weeks    Status New    Target Date 11/18/20                 Plan - 10/24/20 1417    Clinical Impression Statement Completed FOTO, reviewed therapy goals and HEP. Progressed postural strengthening with added cervical isometrics. Educated patient on trigger point dry needling again and reviewed precautions and common side effects. Performed dry needling to bilateral upper trapezius. Patient tolerated well. Twitch response elicited on both sides with palpable decrease in muscle tension. Patient reports improved cervical mobility post needling, slight soreness on RT side. Educated patient on addition of levator stretch to HEP. Patient will continue to benefit from skilled therapy services to progress postural strength and cervical mobility to reduce pain and improve LOF with ADLs.    Personal Factors and Comorbidities Time since onset of injury/illness/exacerbation    Examination-Activity Limitations Carry;Lift;Sit    Examination-Participation Restrictions Cleaning;Laundry;Community Activity    Stability/Clinical Decision Making Stable/Uncomplicated    Rehab Potential Good    PT Frequency 1x / week      PT Duration 4 weeks    PT Treatment/Interventions ADLs/Self Care Home Management;Aquatic Therapy;Biofeedback;Cryotherapy;Ultrasound;Traction;Functional mobility training;Neuromuscular re-education;Patient/family education;Orthotic Fit/Training;Compression bandaging;Scar mobilization;Passive range of motion;Dry needling;Manual techniques;Manual lymph drainage;Energy conservation;Therapeutic activities;Parrafin;Fluidtherapy;Moist Heat;Iontophoresis 4mg /ml Dexamethasone;Stair training;Gait training;DME Instruction;Contrast Bath;Electrical Stimulation;Therapeutic exercise;Balance training;Taping;Joint Manipulations;Vasopneumatic Device;Vestibular;Visual/perceptual remediation/compensation;Spinal Manipulations;Splinting    PT Next Visit Plan Dry needling as indicated. Progress postural strengthening and cervical mobility exercise as tolerated. Add bands when ready    PT Home Exercise Plan Eval: chin  tuck, scap retraction, upper trap stretch 10/24/20: levator stretch    Consulted and Agree with Plan of Care Patient           Patient will benefit from skilled therapeutic intervention in order to improve the following deficits and impairments:  Pain, Improper body mechanics, Increased fascial restricitons, Impaired flexibility, Decreased range of motion, Hypomobility, Postural dysfunction, Decreased mobility  Visit Diagnosis: Cervicalgia  Abnormal posture     Problem List Patient Active Problem List   Diagnosis Date Noted  . COPD with acute exacerbation ([place]) 09/14/2020  . 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 Gastroesophageal reflux disease 10/22/2019  . GERD (gastroesophageal reflux disease) 10/21/2019  . Shortness of breath 10/21/2019  . Anxiety state 08/24/2015   2:30 PM, 10/24/20 Josue Hector PT DPT  Physical Therapist with [facility]  [phone] [place], [place],  [address] Phone: [phone]   Fax:  [phone]  Name: Christina Esparza MRN: [id number] Date of Birth: 1961-05-16

## 2020-10-28 ENCOUNTER — Ambulatory Visit: Payer: 59 | Admitting: Adult Health

## 2020-10-28 ENCOUNTER — Ambulatory Visit (INDEPENDENT_AMBULATORY_CARE_PROVIDER_SITE_OTHER): Payer: 59 | Admitting: Primary Care

## 2020-10-28 ENCOUNTER — Other Ambulatory Visit: Payer: Self-pay | Admitting: Primary Care

## 2020-10-28 ENCOUNTER — Encounter: Payer: Self-pay | Admitting: Primary Care

## 2020-10-28 ENCOUNTER — Other Ambulatory Visit: Payer: Self-pay

## 2020-10-28 ENCOUNTER — Ambulatory Visit (INDEPENDENT_AMBULATORY_CARE_PROVIDER_SITE_OTHER): Payer: 59 | Admitting: Pulmonary Disease

## 2020-10-28 DIAGNOSIS — R0602 Shortness of breath: Secondary | ICD-10-CM

## 2020-10-28 DIAGNOSIS — F411 Generalized anxiety disorder: Secondary | ICD-10-CM | POA: Diagnosis not present

## 2020-10-28 DIAGNOSIS — J449 Chronic obstructive pulmonary disease, unspecified: Secondary | ICD-10-CM

## 2020-10-28 LAB — PULMONARY FUNCTION TEST
DL/VA % pred: 128 %
DL/VA: 5.54 ml/min/mmHg/L
DLCO cor % pred: 104 %
DLCO cor: 19 ml/min/mmHg
DLCO unc % pred: 104 %
DLCO unc: 19 ml/min/mmHg
FEF 25-75 Post: 0.87 L/sec
FEF 25-75 Pre: 0.72 L/sec
FEF2575-%Change-Post: 20 %
FEF2575-%Pred-Post: 40 %
FEF2575-%Pred-Pre: 33 %
FEV1-%Change-Post: 4 %
FEV1-%Pred-Post: 63 %
FEV1-%Pred-Pre: 60 %
FEV1-Post: 1.42 L
FEV1-Pre: 1.36 L
FEV1FVC-%Change-Post: -1 %
FEV1FVC-%Pred-Pre: 84 %
FEV6-%Change-Post: 6 %
FEV6-%Pred-Post: 77 %
FEV6-%Pred-Pre: 72 %
FEV6-Post: 2.18 L
FEV6-Pre: 2.04 L
FEV6FVC-%Change-Post: 0 %
FEV6FVC-%Pred-Post: 103 %
FEV6FVC-%Pred-Pre: 103 %
FVC-%Change-Post: 6 %
FVC-%Pred-Post: 74 %
FVC-%Pred-Pre: 70 %
FVC-Post: 2.19 L
FVC-Pre: 2.05 L
Post FEV1/FVC ratio: 65 %
Post FEV6/FVC ratio: 100 %
Pre FEV1/FVC ratio: 66 %
Pre FEV6/FVC Ratio: 100 %
RV % pred: 167 %
RV: 3.1 L
TLC % pred: 115 %
TLC: 5.33 L

## 2020-10-28 LAB — CBC WITH DIFFERENTIAL/PLATELET
Basophils Absolute: 0 10*3/uL (ref 0.0–0.1)
Basophils Relative: 0.7 % (ref 0.0–3.0)
Eosinophils Absolute: 0.2 10*3/uL (ref 0.0–0.7)
Eosinophils Relative: 3.1 % (ref 0.0–5.0)
HCT: 32 % — ABNORMAL LOW (ref 36.0–46.0)
Hemoglobin: 10.7 g/dL — ABNORMAL LOW (ref 12.0–15.0)
Lymphocytes Relative: 26.3 % (ref 12.0–46.0)
Lymphs Abs: 1.3 10*3/uL (ref 0.7–4.0)
MCHC: 33.3 g/dL (ref 30.0–36.0)
MCV: 93.7 fl (ref 78.0–100.0)
Monocytes Absolute: 0.4 10*3/uL (ref 0.1–1.0)
Monocytes Relative: 7.3 % (ref 3.0–12.0)
Neutro Abs: 3.2 10*3/uL (ref 1.4–7.7)
Neutrophils Relative %: 62.6 % (ref 43.0–77.0)
Platelets: 238 10*3/uL (ref 150.0–400.0)
RBC: 3.42 Mil/uL — ABNORMAL LOW (ref 3.87–5.11)
RDW: 16.9 % — ABNORMAL HIGH (ref 11.5–15.5)
WBC: 5.1 10*3/uL (ref 4.0–10.5)

## 2020-10-28 MED ORDER — STIOLTO RESPIMAT 2.5-2.5 MCG/ACT IN AERS: 2.0000 | INHALATION_SPRAY | Freq: Every day | RESPIRATORY_TRACT | 0 refills | Status: DC

## 2020-10-28 NOTE — Progress Notes (Signed)
Reviewed and agree with assessment/plan.   Chesley Mires, MD [facility] Pulmonary/Critical Care 10/28/2020, 1:08 PM Pager:  [phone]

## 2020-10-28 NOTE — Progress Notes (Signed)
@Patient  ID: Christina Esparza, female    DOB: [birth year], 59 y.o.   MRN: [id number]  Chief Complaint  Patient presents with  . Follow-up    COPD, SOB    Referring provider: Sharilyn Sites, MD  HPI: 59 year old female, current every day smoker. PMH significant for COPD, GERD, OA. Patient of Dr. Halford Chessman last seen on 09/14/20. Maintained on Breztri. Working on smoking cessation. Declined covid and influenza vaccines.   10/28/2020 Patient presents today for 6 week follow-up with PFTs/labs. She was treated for COPD exacerbation in October 2021 with doxycycline, depo-medrol injection and prednisone taper. She recovered well. PFTs today showed moderate obstruction without BD response.  She is using Breztri twice a day as prescribed with spacer. Reports vocie hoarseness and esophageal burning with inhaler use. She uses albuterol rescue inhaler three times a week. She has been under a lot of stress recently. Stress causes her to lose her breath. She lost her mother in September and her father had double amputation. 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 She will consider behavioral health.   TEST/EVENTS :   CT chest 11/09/19 >> coronary calcification, lungs clear  Spirometry 09/02/20 >> FEV1 1.47 (53%), FEV1% 59  PFTs 10/28/2020- FVC 2.19 (74%), FEV1 1.42 (63%), ratio 65, DLCOunc 19 (104%)   Allergies  Allergen Reactions  . Amitiza [Lubiprostone] Itching    Patient states that she experienced a pin sticking sensation, prickling all over.  . Linaclotide Nausea Only  . Ceftin [Cefuroxime] Itching  . Codeine Nausea And Vomiting  . Levaquin [Levofloxacin In D5w]     dream  . Penicillins Other (See Comments)    Felt like she was choking Did it involve swelling of the face/tongue/throat, SOB, or low BP? Unknown Did it involve sudden or severe rash/hives, skin peeling, or any reaction on the inside of your mouth or nose? No Did you need to seek medical attention at a hospital or doctor's office?  No When did it last happen?~10 years ago If all above answers are "NO", may proceed with cephalosporin use.  Felt like she was choking Felt like she was choking Did it involve swelling of the face/tongue/throat, SOB, or low BP? Unknown Did it involve sudden or severe rash/hives, skin peeling, or any reaction on the inside of your mouth or nose? No Did you need to seek medical attention at a hospital or doctor's office? No When did it last happen?~10 years ago If all above answers are "NO", may proceed with cephalosporin use.      There is no immunization history on file for this patient.  Past Medical History:  Diagnosis Date  . Anxiety   . Depression     Tobacco History: Social History   Tobacco Use  Smoking Status Current Every Day Smoker  . Packs/day: 1.00  . Years: 20.00  . Pack years: 20.00  . Types: Cigarettes  Smokeless Tobacco Never Used  Tobacco Comment   smokes 4-5 cigarettes per day 10/20/201   Ready to quit: No Counseling given: Yes Comment: smokes 4-5 cigarettes per day 10/20/201   Outpatient Medications Prior to Visit  Medication Sig Dispense Refill  . ALPRAZolam (XANAX) 1 MG tablet Take 1 mg by mouth 3 (three) times daily as needed for anxiety.    . benzonatate (TESSALON) 100 MG capsule Take 1 capsule (100 mg total) by mouth 3 (three) times daily as needed for cough. 21 capsule 0  . buPROPion (WELLBUTRIN) 100 MG tablet TAKE 1 TABLET BY MOUTH EVERY  DAY FOR 3 DAYS THEN 1 TABLET TWICE DAILY FOR 3 DAYS THEN 1 TABLET THREE TIMES DAILY    . cyclobenzaprine (FLEXERIL) 10 MG tablet Take 10 mg by mouth at bedtime as needed for spasms.    [employer] doxycycline (VIBRA-TABS) 100 MG tablet Take 1 tablet (100 mg total) by mouth 2 (two) times daily. 14 tablet 0  . ipratropium-albuterol (DUONEB) 0.5-2.5 (3) MG/3ML SOLN Take 3 mLs by nebulization every 6 (six) hours as needed. 3 mL 5  . lansoprazole (PREVACID) 30 MG capsule Take 30 mg by mouth daily.    [employer] omeprazole (PRILOSEC)  40 MG capsule Take 1 capsule (40 mg total) by mouth 2 (two) times daily before a meal. 90 capsule 3  . PROAIR HFA 108 (90 Base) MCG/ACT inhaler Inhale 2 puffs into the lungs every 4 (four) hours as needed for wheezing or shortness of breath.    . sertraline (ZOLOFT) 50 MG tablet Take 150 mg by mouth every evening.    . SUMAtriptan (IMITREX) 50 MG tablet Take by mouth.    . varenicline (CHANTIX) 0.5 MG tablet Take 1 tablet (0.5 mg total) by mouth 2 (two) times daily. 60 tablet 0  . Budeson-Glycopyrrol-Formoterol (BREZTRI AEROSPHERE) 160-9-4.8 MCG/ACT AERO Inhale 2 puffs into the lungs in the morning and at bedtime. With spacer. 10.7 g 5  . azelastine (ASTELIN) 0.1 % nasal spray Place 1 spray into both nostrils 2 (two) times daily. Use in each nostril as directed 30 mL 11   No facility-administered medications prior to visit.   Review of Systems  Review of Systems  Constitutional: Negative.   Respiratory: Positive for shortness of breath. Negative for cough, chest tightness and wheezing.   Cardiovascular: Negative.    Physical Exam  BP 120/62 (BP Location: Left Arm, Cuff Size: Normal)   Pulse 96   Temp 99.2 F (37.3 C) (Oral)   Ht 5\' 1"  (1.549 m)   Wt 119 lb (54 kg)   SpO2 99%   BMI 22.48 kg/m  Physical Exam Constitutional:      Appearance: Normal appearance.  HENT:     Mouth/Throat:     Mouth: Mucous membranes are moist.     Pharynx: Oropharynx is clear.  Cardiovascular:     Rate and Rhythm: Normal rate and regular rhythm.  Pulmonary:     Effort: Pulmonary effort is normal.     Breath sounds: Normal breath sounds.  Neurological:     General: No focal deficit present.     Mental Status: She is alert and oriented to person, place, and time. Mental status is at baseline.  Psychiatric:        Mood and Affect: Mood normal.        Behavior: Behavior normal.        Thought Content: Thought content normal.        Judgment: Judgment normal.      Lab Results:  CBC No  results found for: WBC, RBC, HGB, HCT, PLT, MCV, MCH, MCHC, RDW, LYMPHSABS, MONOABS, EOSABS, BASOSABS  BMET    Component Value Date/Time   NA 141 11/15/2015 1505   K 3.9 11/15/2015 1505   CL 108 11/15/2015 1505   CO2 26 11/15/2015 1505   GLUCOSE 76 11/15/2015 1505   BUN 11 11/15/2015 1505   CREATININE 1.10 (H) 11/09/2019 0813   CALCIUM 8.9 11/15/2015 1505   GFRNONAA >60 11/15/2015 1505   GFRAA >60 11/15/2015 1505    BNP No results found for: BNP  ProBNP  No results found for: PROBNP  Imaging: XR C-ARM NO REPORT  Result Date: 09/29/2020 Please see Notes tab for imaging impression.    Assessment & Plan:   Stage 2 moderate COPD by GOLD classification ([place]) - Pulmonary function testing today showed moderate COPD (stage 2) without reversibility  - Recommend switching Breztri to [employer] d/t voice hoarseness likely from ICS. She also did not have an bronchodilator response during her PFTs. 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

## 2020-10-28 NOTE — Addendum Note (Signed)
Addended by: Coralie Keens on: 10/28/2020 05:15 PM   Modules accepted: Orders

## 2020-10-28 NOTE — Patient Instructions (Addendum)
Nice meeting you today [facility] Pulmonary function testing showed moderate COPD (stage 2)  Recommendations: - Stop Breztri  - Start Stiolto two puffs ONCE daily (no steriods in this inhaler) - Continue to work on smoking cessation, decreased amount and pick quit date. You can use nicotine replacement therapy to assist.  - Please get labs drawn today  Orders: - Labs today  Follow-up: - Televisit in 2 week with Beth NP, follow-up new inhaler     Chronic Obstructive Pulmonary Disease Chronic obstructive pulmonary disease (COPD) is a long-term (chronic) lung problem. When you have COPD, it is hard for air to get in and out of your lungs. Usually the condition gets worse over time, and your lungs will never return to normal. There are things you can do to keep yourself as healthy as possible.  Your doctor may treat your condition with: ? Medicines. ? Oxygen. ? Lung surgery.  Your doctor may also recommend: ? Rehabilitation. This includes steps to make your body work better. It may involve a team of specialists. ? Quitting smoking, if you smoke. ? Exercise and changes to your diet. ? Comfort measures (palliative care). Follow these instructions at home: Medicines  Take over-the-counter and prescription medicines only as told by your doctor.  Talk to your doctor before taking any cough or allergy medicines. You may need to avoid medicines that cause your lungs to be dry. Lifestyle  If you smoke, stop. Smoking makes the problem worse. If you need help quitting, ask your doctor.  Avoid being around things that make your breathing worse. This may include smoke, chemicals, and fumes.  Stay active, but remember to rest as well.  Learn and use tips on how to relax.  Make sure you get enough sleep. Most adults need at least 7 hours of sleep every night.  Eat healthy foods. Eat smaller meals more often. Rest before meals. Controlled breathing Learn and use tips on how to control  your breathing as told by your doctor. Try:  Breathing in (inhaling) through your nose for 1 second. Then, pucker your lips and breath out (exhale) through your lips for 2 seconds.  Putting one hand on your belly (abdomen). Breathe in slowly through your nose for 1 second. Your hand on your belly should move out. Pucker your lips and breathe out slowly through your lips. Your hand on your belly should move in as you breathe out.  Controlled coughing Learn and use controlled coughing to clear mucus from your lungs. Follow these steps: 1. Lean your head a little forward. 2. Breathe in deeply. 3. Try to hold your breath for 3 seconds. 4. Keep your mouth slightly open while coughing 2 times. 5. Spit any mucus out into a tissue. 6. Rest and do the steps again 1 or 2 times as needed. General instructions  Make sure you get all the shots (vaccines) that your doctor recommends. Ask your doctor about a flu shot and a pneumonia shot.  Use oxygen therapy and pulmonary rehabilitation if told by your doctor. If you need home oxygen therapy, ask your doctor if you should buy a tool to measure your oxygen level (oximeter).  Make a COPD action plan with your doctor. This helps you to know what to do if you feel worse than usual.  Manage any other conditions you have as told by your doctor.  Avoid going outside when it is very hot, cold, or humid.  Avoid people who have a sickness you can catch (  contagious).  Keep all follow-up visits as told by your doctor. This is important. Contact a doctor if:  You cough up more mucus than usual.  There is a change in the color or thickness of the mucus.  It is harder to breathe than usual.  Your breathing is faster than usual.  You have trouble sleeping.  You need to use your medicines more often than usual.  You have trouble doing your normal activities such as getting dressed or walking around the house. Get help right away if:  You have shortness  of breath while resting.  You have shortness of breath that stops you from: ? Being able to talk. ? Doing normal activities.  Your chest hurts for longer than 5 minutes.  Your skin color is more blue than usual.  Your pulse oximeter shows that you have low oxygen for longer than 5 minutes.  You have a fever.  You feel too tired to breathe normally. Summary  Chronic obstructive pulmonary disease (COPD) is a long-term lung problem.  The way your lungs work will never return to normal. Usually the condition gets worse over time. There are things you can do to keep yourself as healthy as possible.  Take over-the-counter and prescription medicines only as told by your doctor.  If you smoke, stop. Smoking makes the problem worse. This information is not intended to replace advice given to you by your health care provider. Make sure you discuss any questions you have with your health care provider. Document Revised: 10/18/2017 Document Reviewed: 12/10/2016 Elsevier Patient Education  [place].    COPD and Physical Activity Chronic obstructive pulmonary disease (COPD) is a long-term (chronic) condition that affects the lungs. COPD is a general term that can be used to describe many different lung problems that cause lung swelling (inflammation) and limit airflow, including chronic bronchitis and emphysema. The main symptom of COPD is shortness of breath, which makes it harder to do even simple tasks. This can also make it harder to exercise and be active. Talk with your health care provider about treatments to help you breathe better and actions you can take to prevent breathing problems during physical activity. What are the benefits of exercising with COPD? Exercising regularly is an important part of a healthy lifestyle. You can still exercise and do physical activities even though you have COPD. Exercise and physical activity improve your shortness of breath by increasing  blood flow (circulation). This causes your heart to pump more oxygen through your body. Moderate exercise can improve your:  Oxygen use.  Energy level.  Shortness of breath.  Strength in your breathing muscles.  Heart health.  Sleep.  Self-esteem and feelings of self-worth.  Depression, stress, and anxiety levels. Exercise can benefit everyone with COPD. The severity of your disease may affect how hard you can exercise, especially at first, but everyone can benefit. Talk with your health care provider about how much exercise is safe for you, and which activities and exercises are safe for you. What actions can I take to prevent breathing problems during physical activity?  Sign up for a pulmonary rehabilitation program. This type of program may include: ? Education about lung diseases. ? Exercise classes that teach you how to exercise and be more active while improving your breathing. This usually involves:  Exercise using your lower extremities, such as a stationary bicycle.  About 30 minutes of exercise, 2 to 5 times per week, for 6 to 12 weeks  Strength  training, such as push ups or leg lifts. ? Nutrition education. ? Group classes in which you can talk with others who also have COPD and learn ways to manage stress.  If you use an oxygen tank, you should use it while you exercise. Work with your health care provider to adjust your oxygen for your physical activity. Your resting flow rate is different from your flow rate during physical activity.  While you are exercising: ? Take slow breaths. ? Pace yourself and do not try to go too fast. ? Purse your lips while breathing out. Pursing your lips is similar to a kissing or whistling position. ? If doing exercise that uses a quick burst of effort, such as weight lifting:  Breathe in before starting the exercise.  Breathe out during the hardest part of the exercise (such as raising the weights). Where to find support You  can find support for exercising with COPD from:  Your health care provider.  A pulmonary rehabilitation program.  Your local health department or community health programs.  Support groups, online or in-person. Your health care provider may be able to recommend support groups. Where to find more information You can find more information about exercising with COPD from:  American Lung Association: [employer].se.  COPD Foundation: https://www.rivera.net/. Contact a health care provider if:  Your symptoms get worse.  You have chest pain.  You have nausea.  You have a fever.  You have trouble talking or catching your breath.  You want to start a new exercise program or a new activity. Summary  COPD is a general term that can be used to describe many different lung problems that cause lung swelling (inflammation) and limit airflow. This includes chronic bronchitis and emphysema.  Exercise and physical activity improve your shortness of breath by increasing blood flow (circulation). This causes your heart to provide more oxygen to your body.  Contact your health care provider before starting any exercise program or new activity. Ask your health care provider what exercises and activities are safe for you. This information is not intended to replace advice given to you by your health care provider. Make sure you discuss any questions you have with your health care provider. Document Revised: 02/25/2019 Document Reviewed: 11/28/2017 Elsevier Patient Education  2020 [employer].

## 2020-10-28 NOTE — Assessment & Plan Note (Signed)
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

## 2020-10-28 NOTE — Progress Notes (Signed)
Full PFT performed today. °

## 2020-10-28 NOTE — Assessment & Plan Note (Addendum)
-   Pulmonary function testing today showed moderate COPD (stage 2) without reversibility  - Recommend switching Breztri to [employer] d/t voice hoarseness likely from ICS. She also did not have an bronchodilator response during her PFTs. - Encouraged patient to continue to work on smoking cessation - Needs labs drawn today as previously ordered   Follow-up: - Televisit in 2 week with [facility] NP, follow-up new inhaler

## 2020-10-31 ENCOUNTER — Telehealth: Payer: Self-pay | Admitting: Primary Care

## 2020-10-31 NOTE — Telephone Encounter (Signed)
Attempted to call patient, no answer. I do not see where our office contacted this patient.

## 2020-11-01 ENCOUNTER — Ambulatory Visit (HOSPITAL_COMMUNITY): Payer: 59 | Admitting: Physical Therapy

## 2020-11-01 ENCOUNTER — Encounter (HOSPITAL_COMMUNITY): Payer: Self-pay | Admitting: Physical Therapy

## 2020-11-01 ENCOUNTER — Other Ambulatory Visit: Payer: Self-pay

## 2020-11-01 DIAGNOSIS — M542 Cervicalgia: Secondary | ICD-10-CM | POA: Diagnosis not present

## 2020-11-01 DIAGNOSIS — R293 Abnormal posture: Secondary | ICD-10-CM

## 2020-11-01 NOTE — Therapy (Signed)
[place], [place], [address] Phone: [phone]   Fax:  [phone]  Physical Therapy Treatment  Patient Details  Name: Christina Esparza MRN: [id number] Date of Birth: [birth year] Referring Provider (PT): Joni Fears MD    Encounter Date: 11/01/2020   PT End of Session - 11/01/20 1042    Visit Number 3    Number of Visits 4    Date for PT Re-Evaluation 11/18/20    Authorization Type Generic [employer] (no auth, no VL)    PT Start Time 1033    PT Stop Time 1115    PT Time Calculation (min) 42 min    Activity Tolerance Patient tolerated treatment well    Behavior During Therapy [facility] for tasks assessed/performed           Past Medical History:  Diagnosis Date  . Anxiety   . Depression     Past Surgical History:  Procedure Laterality Date  . COLONOSCOPY N/A 06/17/2013   Procedure: COLONOSCOPY;  Surgeon: Rogene Houston, MD;  [facility];  Service: Endoscopy;  Laterality: N/A;  730  . ESOPHAGEAL BRUSHING  11/18/2019   Procedure: ESOPHAGEAL BRUSHING;  Surgeon: Rogene Houston, MD;  [facility];  Service: Endoscopy;;  . ESOPHAGEAL DILATION N/A 11/15/2015   Procedure: ESOPHAGEAL DILATION;  Surgeon: Rogene Houston, MD;  [facility];  Service: Endoscopy;  Laterality: N/A;  . ESOPHAGOGASTRODUODENOSCOPY N/A 11/15/2015   Procedure: ESOPHAGOGASTRODUODENOSCOPY (EGD);  Surgeon: Rogene Houston, MD;  [facility];  Service: Endoscopy;  Laterality: N/A;  . ESOPHAGOGASTRODUODENOSCOPY N/A 11/18/2019   Procedure: ESOPHAGOGASTRODUODENOSCOPY (EGD);  Surgeon: Rogene Houston, MD;  [facility];  Service: Endoscopy;  Laterality: N/A;  2:25  . NASAL SEPTOPLASTY W/ TURBINOPLASTY Bilateral 11/18/2018   Procedure: NASAL SEPTOPLASTY WITH BILATERAL TURBINATE REDUCTION;  Surgeon: Leta Baptist, MD;  Location: [facility];  Service: ENT;  Laterality: Bilateral;  .  TUBAL LIGATION      There were no vitals filed for this visit.   Subjective Assessment - 11/01/20 1039    Subjective Patient says she feels therapy and needling was very helpful. Felt better after last time. Says she thinks she sleeps funny and still has some stiffness and discomfort "in the bones".    Pertinent History arthritis    Limitations Sitting;House hold activities    Diagnostic tests MRI    Patient Stated Goals Keep neck feeling better    Currently in Pain? Yes    Pain Score 3     Pain Location Neck    Pain Orientation Right    Pain Descriptors / Indicators Tightness    Pain Type Chronic pain    Pain Onset More than a month ago    Pain Frequency Intermittent                             [facility] Adult PT Treatment/Exercise - 11/01/20 0001      Neck Exercises: Seated   Cervical Isometrics Right lateral flexion;Left lateral flexion;5 secs;5 reps    Neck Retraction 15 reps    Other Seated Exercise Scap retraction 15 x 5"     Other Seated Exercise thoracic extension x10      Manual Therapy   Manual Therapy Soft tissue mobilization    Manual therapy comments Manuals performed separate from all other activity     Soft tissue  mobilization STM to RT upper trap, with trigger point identification and muscle area preparation (positioning, cleaning) pre dry needling and STM post dry needling for pain and restriction      Neck Exercises: Stretches   Upper Trapezius Stretch Right;Left;30 seconds;3 reps    Levator Stretch Right;Left;2 reps;30 seconds            Trigger Point Dry Needling - 11/01/20 0001    Consent Given? Yes    Education Handout Provided Previously provided    Muscles Treated Head and Neck Upper trapezius    Dry Needling Comments tolerated well     Other Dry Needling 1 needle to RT upper trapezius x2 with pistoning, patient in prone    Upper Trapezius Response Twitch reponse elicited;Palpable increased muscle length                   PT Short Term Goals - 10/20/20 1229      PT SHORT TERM GOAL #1   Title Patient will be independent with initial HEP and self-management strategies to improve functional outcomes    Time 2    Period Weeks    Status New    Target Date 11/04/20             PT Long Term Goals - 10/20/20 1229      PT LONG TERM GOAL #1   Title Patient will improve FOTO score by 5% to indicate improvement in functional outcomes    Time 4    Period Weeks    Status New    Target Date 11/18/20      PT LONG TERM GOAL #2   Title Patient will report at least 70% overall improvement in subjective complaint to indicate improvement in ability to perform ADLs.    Time 4    Period Weeks    Status New    Target Date 11/18/20      PT LONG TERM GOAL #3   Title Patient will be independent with final HEP and self-management strategies, and demo improved postural awareness for improved functional outcomes and QOL    Time 4    Period Weeks    Status New    Target Date 11/18/20                 Plan - 11/01/20 1225    Clinical Impression Statement Patient tolerated session well today and shows good return with prior HEP. Added seated thoracic extension for improved cervical and thoracic mobility. Patient requires verbal cues and demo for proper form and mechanics. Educated on purpose of activity. Patient with palpable trigger point in RT upper trapezius. Addressed with STM manual as well as trigger point dry needling. Patient tolerated well and noted improved mobility as well as decrease in neck pain post treatment. Patient will continue to benefit from skilled therapy services to progress postural strengthening and address muscle restrictions with targeted manual therapy to reduce pain and improve LOF with ADLs.    Personal Factors and Comorbidities Time since onset of injury/illness/exacerbation    Examination-Activity Limitations Carry;Lift;Sit    Examination-Participation Restrictions  Cleaning;Laundry;Community Activity    Stability/Clinical Decision Making Stable/Uncomplicated    Rehab Potential Good    PT Frequency 1x / week    PT Duration 4 weeks    PT Treatment/Interventions ADLs/Self Care Home Management;Aquatic Therapy;Biofeedback;Cryotherapy;Ultrasound;Traction;Functional mobility training;Neuromuscular re-education;Patient/family education;Orthotic Fit/Training;Compression bandaging;Scar mobilization;Passive range of motion;Dry needling;Manual techniques;Manual lymph drainage;Energy conservation;Therapeutic activities;Parrafin;Fluidtherapy;Moist Heat;Iontophoresis 4mg /ml Dexamethasone;Stair training;Gait training;DME Instruction;Contrast Bath;Electrical Stimulation;Therapeutic exercise;Balance training;Taping;Joint Manipulations;Vasopneumatic  Device;Vestibular;Visual/perceptual remediation/compensation;Spinal Manipulations;Splinting    PT Next Visit Plan Dry needling as indicated. Progress postural strengthening and cervical mobility exercise as tolerated. Add bands next visit. Reassess per end of cert.    PT Home Exercise Plan Eval: chin tuck, scap retraction, upper trap stretch 10/24/20: levator stretch    Consulted and Agree with Plan of Care Patient           Patient will benefit from skilled therapeutic intervention in order to improve the following deficits and impairments:  Pain,Improper body mechanics,Increased fascial restricitons,Impaired flexibility,Decreased range of motion,Hypomobility,Postural dysfunction,Decreased mobility  Visit Diagnosis: Cervicalgia  Abnormal posture     Problem List Patient Active Problem List   Diagnosis Date Noted  . Stage 2 moderate COPD by GOLD classification ([place]) 09/14/2020  . 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 Gastroesophageal reflux disease 10/22/2019  . GERD (gastroesophageal reflux disease) 10/21/2019  . Shortness of breath 10/21/2019  . Anxiety state 08/24/2015   12:27 PM, 11/01/20 Josue Hector PT  DPT  Physical Therapist with [facility]  [phone] [place], [place], [address] Phone: [phone]   Fax:  [phone]  Name: Christina Esparza MRN: [id number] Date of Birth: 1961-05-10

## 2020-11-08 ENCOUNTER — Ambulatory Visit (HOSPITAL_COMMUNITY): Payer: 59 | Admitting: Physical Therapy

## 2020-11-09 NOTE — Telephone Encounter (Signed)
Pt has appt with Derl Barrow, NP on 12.23.21. Will close message.

## 2020-11-10 ENCOUNTER — Ambulatory Visit (INDEPENDENT_AMBULATORY_CARE_PROVIDER_SITE_OTHER): Payer: 59 | Admitting: Primary Care

## 2020-11-10 ENCOUNTER — Other Ambulatory Visit: Payer: Self-pay

## 2020-11-10 ENCOUNTER — Encounter: Payer: Self-pay | Admitting: Primary Care

## 2020-11-10 DIAGNOSIS — J449 Chronic obstructive pulmonary disease, unspecified: Secondary | ICD-10-CM

## 2020-11-10 DIAGNOSIS — R490 Dysphonia: Secondary | ICD-10-CM

## 2020-11-10 MED ORDER — STIOLTO RESPIMAT 2.5-2.5 MCG/ACT IN AERS: 2.0000 | INHALATION_SPRAY | Freq: Every day | RESPIRATORY_TRACT | 3 refills | Status: DC

## 2020-11-10 NOTE — Patient Instructions (Signed)
Rx stiolto sent to pharmacy Follow up in 3-4 months

## 2020-11-10 NOTE — Progress Notes (Signed)
Reviewed and agree with assessment/plan.   Ammanda Dobbins, MD [facility] 11/10/2020, 7:08 PM Pager:  [phone]  

## 2020-11-10 NOTE — Progress Notes (Addendum)
Virtual Visit via Telephone Note  I connected with Christina Esparza on 11/10/20 at  9:00 AM EST by telephone and verified that I am speaking with the correct person using two identifiers.  Location: Patient: Home Provider: Office   I discussed the limitations, risks, security and privacy concerns of performing an evaluation and management service by telephone and the availability of in person appointments. I also discussed with the patient that there may be a patient responsible charge related to this service. The patient expressed understanding and agreed to proceed.   History of Present Illness: 59 year old female, current every day smoker. PMH significant for COPD, GERD, OA. Patient of Dr. Halford Chessman last seen on 09/14/20. Maintained on Breztri. Working on smoking cessation. Declined covid and influenza vaccines.   10/28/2020 Patient presents today for 6 week follow-up with PFTs/labs. She was treated for COPD exacerbation in October 2021 with doxycycline, depo-medrol injection and prednisone taper. She recovered well. PFTs today showed moderate obstruction without BD response.  She is using Breztri twice a day as prescribed with spacer. Reports vocie hoarseness and esophageal burning with inhaler use. She uses albuterol rescue inhaler three times a week. She has been under a lot of stress recently. Stress causes her to lose her breath. She lost her mother in September and her father had double amputation. 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 She will consider behavioral health.   11/10/2020- Interim hx  Patient contacted for televisit. Her breathing is about the same on Stiolto. She does not feel like she is as hoarse as she was. She does not have as much esophageal burning. Complains of nasal congestion/ post nasal drip, she has seen allergy. She has a follow-up with them on January 5th 2022. She is allergic to cats and dogs. She is still smoking, she is motivated to quit.    TEST/EVENTS:   CT chest  11/09/19 >> coronary calcification, lungs clear  Spirometry 09/02/20 >> FEV1 1.47 (53%), FEV1% 59  PFTs 10/28/2020- FVC 2.19 (74%), FEV1 1.42 (63%), ratio 65, DLCOunc 19 (104%)   Observations/Objective:  - She is able to speak in full sentences; no overt shortness of breath or wheezing   Assessment and Plan:  COPD: - PFTs 10/28/20 showed moderate obstruction without reversibility - Alpha 1 phenotype MZ, 117  - Breathing is unchanged off ICS  - RX Stiolto respimat two puffs one daily  - Continue to encourage smoking cessation, she is motivated to try after the new year  Voice hoarseness: - Improved off ICS  - Following with Allergy  Follow Up Instructions:   3-4 months with Dr. Halford Chessman  I discussed the assessment and treatment plan with the patient. The patient was provided an opportunity to ask questions and all were answered. The patient agreed with the plan and demonstrated an understanding of the instructions.   The patient was advised to call back or seek an in-person evaluation if the symptoms worsen or if the condition fails to improve as anticipated.  I provided 15 minutes of non-face-to-face time during this encounter.   Martyn Ehrich, NP

## 2020-11-15 ENCOUNTER — Telehealth (HOSPITAL_COMMUNITY): Payer: Self-pay | Admitting: Physical Therapy

## 2020-11-15 NOTE — Telephone Encounter (Signed)
S/w pt she request to be put on hold for 30 days- if she doesn't call us back to r/s please discharge her.

## 2020-11-16 ENCOUNTER — Ambulatory Visit (HOSPITAL_COMMUNITY): Payer: 59 | Admitting: Physical Therapy

## 2020-11-16 LAB — ALPHA-1 ANTITRYPSIN PHENOTYPE: A-1 Antitrypsin, Ser: 117 mg/dL (ref 83–199)

## 2020-11-25 ENCOUNTER — Other Ambulatory Visit: Payer: Self-pay

## 2020-11-25 ENCOUNTER — Encounter: Payer: Self-pay | Admitting: Allergy & Immunology

## 2020-11-25 ENCOUNTER — Ambulatory Visit (INDEPENDENT_AMBULATORY_CARE_PROVIDER_SITE_OTHER): Payer: 59 | Admitting: Allergy & Immunology

## 2020-11-25 VITALS — BP 126/78 | HR 100 | Temp 98.6°F | Resp 17 | Ht 60.24 in | Wt 116.0 lb

## 2020-11-25 DIAGNOSIS — J3089 Other allergic rhinitis: Secondary | ICD-10-CM | POA: Diagnosis not present

## 2020-11-25 DIAGNOSIS — J4489 Other specified chronic obstructive pulmonary disease: Secondary | ICD-10-CM

## 2020-11-25 DIAGNOSIS — J449 Chronic obstructive pulmonary disease, unspecified: Secondary | ICD-10-CM

## 2020-11-25 NOTE — Patient Instructions (Addendum)
1. COPD with asthma overlap syndrome - Lung testing looked slightly worse, but since you are feeling the same I do not think we need to make changes. - I am going to defer to Dr. Halford Chessman for management of your breathing.  - Daily controller medication(s): Stiolto two puffs once daily - Prior to physical activity: albuterol 2 puffs 10-15 minutes before physical activity. - Rescue medications: albuterol 4 puffs every 4-6 hours as needed - Asthma control goals:  * Full participation in all desired activities (may need albuterol before activity) * Albuterol use two time or less a week on average (not counting use with activity) * Cough interfering with sleep two time or less a month * Oral steroids no more than once a year * No hospitalizations  2. Perennial allergic rhinitis (cat, dog) - Continue with: nasal saline rinses twice daily - Continue taking: Astelin (azelastine) 2 sprays per nostril 1-2 times daily as needed  - Allergy shot consent signed, but we are not going to mix anything until we hear from you that you definitely want to start. - Call your insurance company today about this.   3. Return in about 6 weeks (around 01/06/2021).    Please inform us of any Emergency Department visits, hospitalizations, or changes in symptoms. Call us before going to the ED for breathing or allergy symptoms since we might be able to fit you in for a sick visit. Feel free to contact us anytime with any questions, problems, or concerns.  It was a pleasure to see you again today!  Websites that have reliable patient information: 1. American Academy of Asthma, Allergy, and Immunology: www.aaaai.org 2. Food Allergy Research and Education (FARE): foodallergy.org 3. Mothers of Asthmatics: http://www.asthmacommunitynetwork.org 4. American College of Allergy, Asthma, and Immunology: www.acaai.org   COVID-19 Vaccine Information can be found at:  ShippingScam.co.uk For questions related to vaccine distribution or appointments, please email vaccine@[email or web address] .com or call [phone].     "Like" [place] on Facebook and Instagram for our latest updates!       Make sure you are registered to vote! If you have moved or changed any of your contact information, you will need to get this updated before voting!  In some cases, you MAY be able to register to vote online: [email or web address]

## 2020-11-25 NOTE — Progress Notes (Signed)
FOLLOW UP  Date of Service/Encounter:  11/25/20   Assessment:   SOB (shortness of breath) -likely COPD but not excited about seeing Pulmonology  Perennial allergic rhinitis(cat, dog) - interested in starting allegen immunotherapy     [name] presents for follow-up visit.  He continues to be very difficult to get a sense of her breathing symptoms during the visit today, as she is not the greatest historian.  It is also difficult because she does not use anything consistently.  She will use something for a week or 2 at a time, but then developed symptoms such as hoarseness and stop using it.  This is the case with her Breztri.  She has developed hoarseness and on sense of malaise after using it for several days in a row, and this is happened on 3 different occasions.  Therefore, she should just remain off of this.  She has been using the [place] without the symptoms, but she has not noticed that it has helped very much.  Overall, it is difficult to get a read on her clinically and symptomatically.  Although her spirometry is worse today, she is not any worse symptomatically.   She is very interested in starting allergen immunotherapy and we did write a script for dog and cat., I am pushing the cat dander in the script since she has one at home. There is also plenty of room for diluent to improve the tolerability of the shot.  Plan/Recommendations:   1. COPD with asthma overlap syndrome - Lung testing looked slightly worse, but since you are feeling the same I do not think we need to make changes. - I am going to defer to Dr. Halford Chessman for management of your breathing.  - Daily controller medication(s): Stiolto two puffs once daily - Prior to physical activity: albuterol 2 puffs 10-15 minutes before physical activity. - Rescue medications: albuterol 4 puffs every 4-6 hours as needed - Asthma control goals:  * Full participation in all desired activities (may need albuterol before  activity) * Albuterol use two time or less a week on average (not counting use with activity) * Cough interfering with sleep two time or less a month * Oral steroids no more than once a year * No hospitalizations  2. Perennial allergic rhinitis (cat, dog) - Continue with: nasal saline rinses twice daily - Continue taking: Astelin (azelastine) 2 sprays per nostril 1-2 times daily as needed  - Allergy shot consent signed, but we are not going to mix anything until we hear from you that you definitely want to start. - Call your insurance company today about this.   3. Return in about 6 weeks (around 01/06/2021).   Subjective:   Christina Esparza is a 60 y.o. female presenting today for follow up of  Chief Complaint  Patient presents with  . COPD    Still having allergy problems    Christina Esparza has a history of the following: Patient Active Problem List   Diagnosis Date Noted  . Stage 2 moderate COPD by GOLD classification ([place]) 09/14/2020  . 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 Gastroesophageal reflux disease 10/22/2019  . GERD (gastroesophageal reflux disease) 10/21/2019  . Shortness of breath 10/21/2019  . Anxiety state 08/24/2015    History obtained from: chart review and patient.  Christina Esparza is a 60 y.o. female presenting for a follow up visit. She was last seen in Octrober 2021. At that time, her lung testing looked stable. We continued with Judithann Sauger  two puffs twice daily as well as albuterol as needed. For her rhinitis, we continued with nasal saline rinses as well as Astelin two sprays per nostril up to twice daily. We did discuss allergy shots as a means fo long term control. We did order labs to see if she would qualify for an injectable medication. Unfortunately, these were never collected. She did have AAT and a CBC when she went to see Pulmonology. AEC was 200.   She did call us in October reporting that she had to go to the ED on a couple of occasions after our last  visit with her. We finally convinced her that she needed to see Pulmonology. We got an appointment with Dr. Halford Chessman.   At Dr. Juanetta Gosling visit, it was felt that she had a COPD exacerbation that was slow to resolve. She had already been on azithromycin and prednisone from the ED. She was placed on doxycycline and given DepoMedrol at Dr. Juanetta Gosling appointment in October 2021. COVID19 testing has been negative. She has declined the COVID vaccine in the past.   She has been changed to [employer] due to hoarseness with the [place]. Hoarseness improved overall, but even with both of the medications, she does not feel any different from a breathing perspective.  She has not heard any word on starting a biologic for her symptoms.  She seems a little confused when I asked her about this.  She has a follow-up with pulmonology in March, per the patient.   Her main complaint today focuses on her nasal congestion and postnasal drip.  She has been using the Astelin, but has not been noticing much of a difference.  She has been on Flonase in the past, but does not want to use steroids.  Overall, she likes to just use the fewest amount of medications cumulatively.  She does not have a cat at home who does not spend much time with her, but is still around in her trailer.  She has boarded off part of the trailer to keep the cat out of her bedroom.  She has noticed minimal improvement with this.  She does use nasal saline rinses.  She is open to allergy shot and would like to know more about that today. Since the last visit, she has remained so-so. Her mother unfortunately passed in September 2021. Then her father's legs were amputated that   Otherwise, there have been no changes to her past medical history, surgical history, family history, or social history.    Review of Systems  Constitutional: Negative.  Negative for fever, malaise/fatigue and weight loss.  HENT: Positive for congestion. Negative for ear discharge and ear  pain.        Positive for postnasal drip.  Eyes: Negative for pain, discharge and redness.  Respiratory: Positive for cough and shortness of breath. Negative for sputum production and wheezing.   Cardiovascular: Negative.  Negative for chest pain and palpitations.  Gastrointestinal: Negative for abdominal pain and heartburn.  Skin: Negative.  Negative for itching and rash.  Neurological: Negative for dizziness and headaches.  Endo/Heme/Allergies: Negative for environmental allergies. Does not bruise/bleed easily.       Objective:   Blood pressure 126/78, pulse 100, temperature 98.6 F (37 C), temperature source Temporal, resp. rate 17, height 5' 0.24" (1.53 m), weight 116 lb (52.6 kg), SpO2 95 %. Body mass index is 22.48 kg/m.   Physical Exam:  Physical Exam Constitutional:      Appearance: She is well-developed.  Comments: Pleasant but sad at times.  Very cooperative with the exam.  HENT:     Head: Normocephalic and atraumatic.     Right Ear: Tympanic membrane, ear canal and external ear normal.     Left Ear: Tympanic membrane, ear canal and external ear normal.     Nose: No nasal deformity, septal deviation, mucosal edema, rhinorrhea or epistaxis.     Right Turbinates: Enlarged and swollen.     Left Turbinates: Enlarged and swollen.     Right Sinus: No maxillary sinus tenderness or frontal sinus tenderness.     Left Sinus: No maxillary sinus tenderness or frontal sinus tenderness.     Comments: Erythematous turbinates.  Moderate clear discharge.    Mouth/Throat:     Lips: Pink.     Mouth: Oropharynx is clear and moist. Mucous membranes are moist. Mucous membranes are not pale and not dry.     Pharynx: Uvula midline.     Comments: Mild cobblestoning. Eyes:     General:        Right eye: No discharge.        Left eye: No discharge.     Extraocular Movements: EOM normal.     Conjunctiva/sclera: Conjunctivae normal.     Right eye: Right conjunctiva is not injected. No  chemosis.    Left eye: Left conjunctiva is not injected. No chemosis.    Pupils: Pupils are equal, round, and reactive to light.  Cardiovascular:     Rate and Rhythm: Normal rate and regular rhythm.     Heart sounds: Normal heart sounds.  Pulmonary:     Effort: Pulmonary effort is normal. No tachypnea, accessory muscle usage or respiratory distress.     Breath sounds: Normal breath sounds. No wheezing, rhonchi or rales.     Comments: Fair air movement throughout.  No crackles or wheezes. Chest:     Chest wall: No tenderness.  Lymphadenopathy:     Cervical: No cervical adenopathy.  Skin:    Coloration: Skin is not pale.     Findings: No abrasion, erythema, petechiae or rash. Rash is not papular, urticarial or vesicular.  Neurological:     Mental Status: She is alert.  Psychiatric:        Mood and Affect: Mood and affect normal.      Diagnostic studies:    Spirometry: results abnormal (FEV1: 1.11/52%, FVC: 1.60/57%, FEV1/FVC: 69%).    Spirometry consistent with possible restrictive disease. Overall values are lower compared to those obtained at the last visit in October 2021. They are also lower than the values obtained during the full PFTs in December 2021.   Allergy Studies: none        Salvatore Marvel, MD  [facility]

## 2020-11-28 ENCOUNTER — Telehealth: Payer: Self-pay | Admitting: Physical Medicine and Rehabilitation

## 2020-11-28 DIAGNOSIS — J3081 Allergic rhinitis due to animal (cat) (dog) hair and dander: Secondary | ICD-10-CM | POA: Diagnosis not present

## 2020-11-28 NOTE — Progress Notes (Signed)
Aeroallergen Immunotherapy     Patient Details  Name: Christina Esparza  MRN: [id number]  Date of Birth: June 08, 1961   Order 1 of 1   Vial Label: Cat/Dog   0.7 ml (Volume) 1:10 Concentration -- Cat Hair  0.5 ml (Volume) 1:10 Concentration -- Dog Epithelia    1.2 ml Extract Subtotal  3.8 ml Diluent  5.0 ml Maintenance Total    Final Concentration above is stated in weight/volume (wt/vol). Allergen units (AU/ml) biological units (BAU/ml). The total volume is 5 ml.    Schedule: A   Special Instructions: none

## 2020-11-28 NOTE — Progress Notes (Signed)
VIALS EXP 11-28-21 

## 2020-11-28 NOTE — Telephone Encounter (Signed)
Patient called  needing to R/S her appointment. The number to contact patient is [phone]

## 2020-11-28 NOTE — Telephone Encounter (Signed)
Called pt back and resch due to a cold. Sch 2/1

## 2020-11-29 ENCOUNTER — Ambulatory Visit: Payer: 59 | Admitting: Physical Medicine and Rehabilitation

## 2020-11-30 ENCOUNTER — Telehealth: Payer: Self-pay

## 2020-11-30 NOTE — Telephone Encounter (Signed)
Patient informed of the medication and testing recommendations. She says that she has some covid testing kits on the way via mail. I also gave her the number for [facility] testing options. Patient is aware of need for testing. I let her know that we will touch base tomorrow to see how she is doing.

## 2020-11-30 NOTE — Telephone Encounter (Signed)
I would recommend that she get COVID19 testing. I would also encourage her to use nasal saline rinses as well as increased hydration with Mucinex  [id number] mg twice daily. It is likely viral at this point in time.   Make sure that she is using albuterol: 4 puffs every 4 hours during the day for a week or so.   Let's touch base with her tomorrow.   Salvatore Marvel, MD [facility]

## 2020-11-30 NOTE — Telephone Encounter (Signed)
Patient was seen 11/25/20 in [place]. She is complaining of sinus congestion, chest congestion, productive cough with green mucus, some body aches, some chills, no fever. Taking meds as prescribed. Further instructions.

## 2020-12-06 MED ORDER — PREDNISONE 10 MG PO TABS: ORAL_TABLET | ORAL | 0 refills | Status: DC

## 2020-12-06 NOTE — Telephone Encounter (Signed)
Patient contacted me on the morning of 12/06/2020. She reported that her COVID testing came back positive. She does have a pulse ox at home and it has been hovering between 92% and 95%. She is making full sentences but having a lot of body aches. She is also having a lot of chest congestion.  She would not qualify for the monoclonal antibodies, I would not think. I told her I would send in prednisone to help break up her chest congestion.  I contacted patient to let her know that I sent in the prescription.   Salvatore Marvel, MD [facility]

## 2020-12-06 NOTE — Addendum Note (Signed)
Addended by: Valentina Shaggy on: 12/06/2020 11:41 AM   Modules accepted: Orders

## 2020-12-16 ENCOUNTER — Other Ambulatory Visit: Payer: Self-pay

## 2020-12-16 ENCOUNTER — Ambulatory Visit (INDEPENDENT_AMBULATORY_CARE_PROVIDER_SITE_OTHER): Payer: 59

## 2020-12-16 DIAGNOSIS — J309 Allergic rhinitis, unspecified: Secondary | ICD-10-CM

## 2020-12-16 MED ORDER — EPINEPHRINE 0.3 MG/0.3ML IJ SOAJ: 0.3000 mg | Freq: Once | INTRAMUSCULAR | 2 refills | Status: AC

## 2020-12-16 NOTE — Progress Notes (Signed)
Immunotherapy  Patient Details  Name: Christina Esparza MRN: [id number] Date of Birth: 1960/12/02  12/16/2020  Christina Esparza started an injection today on blue vial, waited 30 minutes in the office without any reactions. Following schedule: A  Frequency:weekly Epi-Pen:Yes  Consent signed and patient instructions given.   Christina Esparza 12/16/2020, 10:07 AM

## 2020-12-20 ENCOUNTER — Ambulatory Visit: Payer: 59 | Admitting: Physical Medicine and Rehabilitation

## 2020-12-23 ENCOUNTER — Ambulatory Visit (INDEPENDENT_AMBULATORY_CARE_PROVIDER_SITE_OTHER): Payer: 59

## 2020-12-23 DIAGNOSIS — J309 Allergic rhinitis, unspecified: Secondary | ICD-10-CM | POA: Diagnosis not present

## 2020-12-28 ENCOUNTER — Ambulatory Visit: Payer: Self-pay

## 2020-12-28 ENCOUNTER — Ambulatory Visit (INDEPENDENT_AMBULATORY_CARE_PROVIDER_SITE_OTHER): Payer: 59 | Admitting: Orthopaedic Surgery

## 2020-12-28 ENCOUNTER — Encounter: Payer: Self-pay | Admitting: Orthopaedic Surgery

## 2020-12-28 ENCOUNTER — Other Ambulatory Visit: Payer: Self-pay

## 2020-12-28 VITALS — Ht 60.0 in | Wt 115.0 lb

## 2020-12-28 DIAGNOSIS — M25551 Pain in right hip: Secondary | ICD-10-CM | POA: Diagnosis not present

## 2020-12-28 MED ORDER — LIDOCAINE HCL 1 % IJ SOLN
2.0000 mL | INTRAMUSCULAR | Status: AC | PRN
  Administered 2020-12-28: 2 mL

## 2020-12-28 MED ORDER — BUPIVACAINE HCL 0.5 % IJ SOLN
2.0000 mL | INTRAMUSCULAR | Status: AC | PRN
  Administered 2020-12-28: 2 mL via INTRA_ARTICULAR

## 2020-12-28 NOTE — Progress Notes (Signed)
Office Visit Note   Patient: Christina Esparza           Date of Birth: Oct 21, 1961           MRN: [id number] Visit Date: 12/28/2020              Requested by: Sharilyn Sites, [address],  [place] [address] PCP: Sharilyn Sites, MD   Assessment & Plan: Visit Diagnoses:  1. Pain in right hip     Plan: Several possibilities for lateral right hip pain including referred pain from back bursitis of the greater trochanteric region.  Does have some local tenderness over the greater trochanter so we will start with a cortisone injection and monitor response.  Consider MRI scan of lumbar spine if no improvement as I think that would be the more likely focus of pain origin  Follow-Up Instructions: Return if symptoms worsen or fail to improve.   Orders:  Orders Placed This Encounter  Procedures  . Large Joint Inj: R greater trochanter  . XR Lumbar Spine 2-3 Views  . XR Pelvis 1-2 Views   No orders of the defined types were placed in this encounter.     Procedures: Large Joint Inj: R greater trochanter on 12/28/2020 12:14 PM Indications: pain and diagnostic evaluation Details: 25 G 1.5 in needle  Arthrogram: No  Medications: 2 mL lidocaine 1 %; 2 mL bupivacaine 0.5 %  12 mg betamethasone injected over the greater trochanter right hip with Marcaine and Xylocaine Procedure, treatment alternatives, risks and benefits explained, specific risks discussed. Consent was given by the patient. Immediately prior to procedure a time out was called to verify the correct patient, procedure, equipment, support staff and site/side marked as required. Patient was prepped and draped in the usual sterile fashion.       Clinical Data: No additional findings.   Subjective: Chief Complaint  Patient presents with  . Right Ankle - Pain  . Right Hip - Pain  Patient presents today for right hip pain. She said that her hip has been hurting since she had covid several weeks ago. She is  unable to sleep at night. She received a tattoo on her right ankle about 8 months ago and states that it tingles in that area. She has tried over the counter pain medicine and that does not help.  Occasionally will feel some pain originates along the lateral aspect of her right hip and radiate along the lateral aspect of her leg as far distally as the lateral foot.  Is had some issues with her back.  No central groin or anterior thigh pain.  HPI  Review of Systems   Objective: Vital Signs: Ht 5' (1.524 m)   Wt 115 lb (52.2 kg)   BMI 22.46 kg/m   Physical Exam Constitutional:      Appearance: She is well-developed and well-nourished.  HENT:     Mouth/Throat:     Mouth: Oropharynx is clear and moist.  Eyes:     Extraocular Movements: EOM normal.     Pupils: Pupils are equal, round, and reactive to light.  Pulmonary:     Effort: Pulmonary effort is normal.  Skin:    General: Skin is warm and dry.  Neurological:     Mental Status: She is alert and oriented to person, place, and time.  Psychiatric:        Mood and Affect: Mood and affect normal.        Behavior: Behavior normal.  Ortho Exam awake alert and oriented x3.  Comfortable sitting.  There is some discomfort directly over the greater trochanter of the right hip.  Skin intact.  No pain with internal/external rotation of her hip leg raise negative.  Some mild areas of discomfort with percussion of the lumbar spine.  Motor and sensory exam intact Specialty Comments:  No specialty comments available.  Imaging: XR Lumbar Spine 2-3 Views  Result Date: 12/28/2020 Films of the lumbar spine were obtained in 2 projections.  There is grade 1 anterior listhesis on L4-L5.  No acute changes.  Slight left lumbar scoliosis of 5 degrees.  Facet sclerosis at L4-5 and L5-S1 with no acute changes  XR Pelvis 1-2 Views  Result Date: 12/28/2020 [facility] the pelvis and lateral of the right hip were obtained.  Some decreased bony mineralization.   Subchondral cysts are identified in the acetabulum and femoral head but joint space is well-maintained on the right.  Small area of ectopic calcification of the tip of the greater trochanter appears to be old.  No acute changes    PMFS History: Patient Active Problem List   Diagnosis Date Noted  . Pain in right hip 12/28/2020  . Stage 2 moderate COPD by GOLD classification ([place]) 09/14/2020  . 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 Gastroesophageal reflux disease 10/22/2019  . GERD (gastroesophageal reflux disease) 10/21/2019  . Shortness of breath 10/21/2019  . Anxiety state 08/24/2015   Past Medical History:  Diagnosis Date  . Anxiety   . Depression     Family History  Problem Relation Age of Onset  . Colon polyps Mother   . Prostate cancer Father   . Colon cancer Neg Hx     Past Surgical History:  Procedure Laterality Date  . COLONOSCOPY N/A 06/17/2013   Procedure: COLONOSCOPY;  Surgeon: Rogene Houston, MD;  [facility];  Service: Endoscopy;  Laterality: N/A;  730  . ESOPHAGEAL BRUSHING  11/18/2019   Procedure: ESOPHAGEAL BRUSHING;  Surgeon: Rogene Houston, MD;  [facility];  Service: Endoscopy;;  . ESOPHAGEAL DILATION N/A 11/15/2015   Procedure: ESOPHAGEAL DILATION;  Surgeon: Rogene Houston, MD;  [facility];  Service: Endoscopy;  Laterality: N/A;  . ESOPHAGOGASTRODUODENOSCOPY N/A 11/15/2015   Procedure: ESOPHAGOGASTRODUODENOSCOPY (EGD);  Surgeon: Rogene Houston, MD;  [facility];  Service: Endoscopy;  Laterality: N/A;  . ESOPHAGOGASTRODUODENOSCOPY N/A 11/18/2019   Procedure: ESOPHAGOGASTRODUODENOSCOPY (EGD);  Surgeon: Rogene Houston, MD;  [facility];  Service: Endoscopy;  Laterality: N/A;  2:25  . NASAL SEPTOPLASTY W/ TURBINOPLASTY Bilateral 11/18/2018   Procedure: NASAL SEPTOPLASTY WITH BILATERAL TURBINATE REDUCTION;  Surgeon: Leta Baptist, MD;  Location: [facility];  Service: ENT;   Laterality: Bilateral;  . TUBAL LIGATION     Social History   Occupational History  . Not on file  Tobacco Use  . Smoking status: Current Every Day Smoker    Packs/day: 1.00    Years: 20.00    Pack years: 20.00    Types: Cigarettes  . Smokeless tobacco: Never Used  . Tobacco comment: smokes 4-5 cigarettes per day 10/20/201  Vaping Use  . Vaping Use: Never used  Substance and Sexual Activity  . Alcohol use: No    Alcohol/week: 0.0 standard drinks  . Drug use: No  . Sexual activity: Not on file

## 2020-12-30 ENCOUNTER — Ambulatory Visit (INDEPENDENT_AMBULATORY_CARE_PROVIDER_SITE_OTHER): Payer: 59

## 2020-12-30 DIAGNOSIS — J309 Allergic rhinitis, unspecified: Secondary | ICD-10-CM

## 2021-01-06 ENCOUNTER — Ambulatory Visit (INDEPENDENT_AMBULATORY_CARE_PROVIDER_SITE_OTHER): Payer: 59 | Admitting: Allergy & Immunology

## 2021-01-06 ENCOUNTER — Encounter: Payer: Self-pay | Admitting: Allergy & Immunology

## 2021-01-06 ENCOUNTER — Other Ambulatory Visit: Payer: Self-pay

## 2021-01-06 VITALS — BP 130/80 | HR 92 | Temp 98.6°F | Resp 17

## 2021-01-06 DIAGNOSIS — J3089 Other allergic rhinitis: Secondary | ICD-10-CM

## 2021-01-06 DIAGNOSIS — J449 Chronic obstructive pulmonary disease, unspecified: Secondary | ICD-10-CM | POA: Diagnosis not present

## 2021-01-06 DIAGNOSIS — J309 Allergic rhinitis, unspecified: Secondary | ICD-10-CM | POA: Diagnosis not present

## 2021-01-06 MED ORDER — TRELEGY ELLIPTA 100-62.5-25 MCG/INH IN AEPB: 1.0000 | INHALATION_SPRAY | Freq: Every day | RESPIRATORY_TRACT | 5 refills | Status: DC

## 2021-01-06 NOTE — Patient Instructions (Signed)
1. COPD with asthma overlap syndrome - Lung testing looked slightly better today. - Sample of Trelegy provided to see if that helps without the throat involvement.  - Daily controller medication(s): Trelegy 138mcg one puff once daily - Prior to physical activity: albuterol 2 puffs 10-15 minutes before physical activity. - Rescue medications: albuterol 4 puffs every 4-6 hours as needed - Asthma control goals:  * Full participation in all desired activities (may need albuterol before activity) * Albuterol use two time or less a week on average (not counting use with activity) * Cough interfering with sleep two time or less a month * Oral steroids no more than once a year * No hospitalizations  2. Perennial allergic rhinitis (cat, dog) - Continue with: nasal saline rinses twice daily - Continue taking: Astelin (azelastine) 2 sprays per nostril 1-2 times daily as needed  - Continue with allergy shots at the same schedule..   3. Return in about 3 months (around 04/05/2021).    Please inform us of any Emergency Department visits, hospitalizations, or changes in symptoms. Call us before going to the ED for breathing or allergy symptoms since we might be able to fit you in for a sick visit. Feel free to contact us anytime with any questions, problems, or concerns.  It was a pleasure to see you again today! Try CBD gummies to see if that helps with the anxiety!   Websites that have reliable patient information: 1. American Academy of Asthma, Allergy, and Immunology: www.aaaai.org 2. Food Allergy Research and Education (FARE): foodallergy.org 3. Mothers of Asthmatics: http://www.asthmacommunitynetwork.org 4. American College of Allergy, Asthma, and Immunology: www.acaai.org   COVID-19 Vaccine Information can be found at: ShippingScam.co.uk For questions related to vaccine distribution or appointments, please email vaccine@[email or web address]  or call [phone].   We realize that you might be concerned about having an allergic reaction to the COVID19 vaccines. To help with that concern, WE ARE OFFERING THE COVID19 VACCINES IN OUR OFFICE! Ask the front desk for dates!     "Like" [place] on Facebook and Instagram for our latest updates!      A healthy democracy works best when [employer] participate! Make sure you are registered to vote! If you have moved or changed any of your contact information, you will need to get this updated before voting!  In some cases, you MAY be able to register to vote online: [email or web address]

## 2021-01-06 NOTE — Progress Notes (Signed)
FOLLOW UP  Date of Service/Encounter:  01/06/21   Assessment:   SOB (shortness of breath) -likely COPDbut not excited about seeing Pulmonology  Perennial allergic rhinitis(cat, dog) - on allegen immunotherapy   Adverse reaction to respiratory (sore throat), but worsening control with Stiolto   Plan/Recommendations:   1. COPD with asthma overlap syndrome - Lung testing looked slightly better today. - Sample of Trelegy provided to see if that helps without the throat involvement.  - Daily controller medication(s): Trelegy 172mcg one puff once daily - Prior to physical activity: albuterol 2 puffs 10-15 minutes before physical activity. - Rescue medications: albuterol 4 puffs every 4-6 hours as needed - Asthma control goals:  * Full participation in all desired activities (may need albuterol before activity) * Albuterol use two time or less a week on average (not counting use with activity) * Cough interfering with sleep two time or less a month * Oral steroids no more than once a year * No hospitalizations  2. Perennial allergic rhinitis (cat, dog) - Continue with: nasal saline rinses twice daily - Continue taking: Astelin (azelastine) 2 sprays per nostril 1-2 times daily as needed  - Continue with allergy shots at the same schedule..   3. Return in about 3 months (around 04/05/2021).   Subjective:   Christina Esparza is a 60 y.o. female presenting today for follow up of  Chief Complaint  Patient presents with  . Asthma    Christina Esparza has a history of the following: Patient Active Problem List   Diagnosis Date Noted  . Pain in right hip 12/28/2020  . Stage 2 moderate COPD by GOLD classification ([place]) 09/14/2020  . 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 Gastroesophageal reflux disease 10/22/2019  . GERD (gastroesophageal reflux disease) 10/21/2019  . Shortness of breath 10/21/2019  . Anxiety state 08/24/2015    History obtained from: chart review and  patient.  Christina Esparza is a 60 y.o. female presenting for a follow up visit.  She was last seen in January 2022.  At that time, her lung testing looked slightly worse.  We did not make any changes since she was feeling fairly good.  We had previously referred her to see pulmonology and she saw Dr. Halford Chessman and Geraldo Pitter NP.  We continued her on Stiolto 2 puffs once daily.  We also continued her with albuterol 2 puffs 10 to 15 minutes before physical activity every 4-6 hours as needed.  For her allergic rhinitis, we continued with his nasal saline rinses twice daily and Astelin.  She did decide to start allergen immunotherapy and she has not begun that.  She reports that there is a constant tightness. She reports that this has been ongoing. It never goes away. She has been under a lot of stress, however.   She did just complete prednisone when she had COVID19.  Otherwise she has not needed any prednisone.  Asthma/Respiratory Symptom History: She was on Breztri when I first saw her. This was working well. Her last appt with [facility] was with Geraldo Pitter NP. She was placed on Stiolto instead of the [employer]. She went back to [place] herself and while she feels better from a breathing perspective, but her gets a sore throat. She went back to Stiolto and the sore throat improved but breathing did not.   Allergic Rhinitis Symptom History: She remains on her nasal saline rinses.  She also went on Astelin, although she tells me that I recommended that she stop  it at the last visit.  I do not remember this at all.  Regardless, she has also started her allergen immunotherapy.  She has only had 4 injections remains in the blue vial of her dog and cat allergen immunotherapy.  She does report that she has had worsening ocular itching and watering since starting the shots.  She is having a lot of emotional baggage. She is Zoloft and Xanax for anxiety. She is having an MRI on Monday for pain going down her  back.   Otherwise, there have been no changes to her past medical history, surgical history, family history, or social history.    Review of Systems  Constitutional: Negative.  Negative for chills, fever, malaise/fatigue and weight loss.  HENT: Positive for congestion. Negative for ear discharge, ear pain and sinus pain.        Positive for dried rhinorrhea and scabbing.  Eyes: Negative for pain, discharge and redness.  Respiratory: Positive for cough and shortness of breath. Negative for sputum production and wheezing.   Cardiovascular: Negative.  Negative for chest pain and palpitations.  Gastrointestinal: Negative for abdominal pain, constipation, diarrhea, heartburn, nausea and vomiting.  Skin: Negative.  Negative for itching and rash.  Neurological: Negative for dizziness and headaches.  Endo/Heme/Allergies: Positive for environmental allergies. Does not bruise/bleed easily.       Objective:   Blood pressure 130/80, pulse 92, temperature 98.6 F (37 C), temperature source Temporal, resp. rate 17, SpO2 100 %. There is no height or weight on file to calculate BMI.   Physical Exam:  Physical Exam Constitutional:      Appearance: She is well-developed.     Comments: Pleasant female.  Cooperative with the exam.  HENT:     Head: Normocephalic and atraumatic.     Right Ear: Tympanic membrane, ear canal and external ear normal.     Left Ear: Tympanic membrane, ear canal and external ear normal.     Nose: No nasal deformity, septal deviation, mucosal edema, rhinorrhea or epistaxis.     Right Turbinates: Enlarged and swollen.     Left Turbinates: Enlarged and swollen.     Right Sinus: No maxillary sinus tenderness or frontal sinus tenderness.     Left Sinus: No maxillary sinus tenderness or frontal sinus tenderness.     Comments: Dried rhinorrhea.  No epistaxis.    Mouth/Throat:     Mouth: Oropharynx is clear and moist. Mucous membranes are not pale and not dry.     Pharynx:  Uvula midline.  Eyes:     General:        Right eye: No discharge.        Left eye: No discharge.     Extraocular Movements: EOM normal.     Conjunctiva/sclera: Conjunctivae normal.     Right eye: Right conjunctiva is not injected. No chemosis.    Left eye: Left conjunctiva is not injected. No chemosis.    Pupils: Pupils are equal, round, and reactive to light.  Cardiovascular:     Rate and Rhythm: Normal rate and regular rhythm.     Heart sounds: Normal heart sounds.  Pulmonary:     Effort: Pulmonary effort is normal. No tachypnea, accessory muscle usage or respiratory distress.     Breath sounds: Normal breath sounds. No wheezing, rhonchi or rales.     Comments: Decreased air movement at the bases. Chest:     Chest wall: No tenderness.  Lymphadenopathy:     Cervical: No cervical adenopathy.  Skin:    Coloration: Skin is not pale.     Findings: No abrasion, erythema, petechiae or rash. Rash is not papular, urticarial or vesicular.  Neurological:     Mental Status: She is alert.  Psychiatric:        Mood and Affect: Mood and affect normal.        Behavior: Behavior is cooperative.      Diagnostic studies:    Spirometry: results abnormal (FEV1: 1.26/59%, FVC: 1.69/61%, FEV1/FVC: 75%).    Spirometry consistent with possible restrictive disease.   Allergy Studies: none       Salvatore Marvel, MD  [facility]

## 2021-01-09 ENCOUNTER — Other Ambulatory Visit: Payer: Self-pay

## 2021-01-09 ENCOUNTER — Telehealth: Payer: Self-pay

## 2021-01-09 DIAGNOSIS — M545 Low back pain, unspecified: Secondary | ICD-10-CM

## 2021-01-09 DIAGNOSIS — G8929 Other chronic pain: Secondary | ICD-10-CM

## 2021-01-09 NOTE — Telephone Encounter (Signed)
Patient called regarding referral for a mri she would like a call back regarding a appointment call [phone]

## 2021-01-09 NOTE — Telephone Encounter (Signed)
Spoke with patient and ordered an MRI of her L-Spine per last dictation from Dr.Whitfield.

## 2021-01-11 ENCOUNTER — Other Ambulatory Visit: Payer: Self-pay

## 2021-01-11 ENCOUNTER — Ambulatory Visit: Payer: 59 | Admitting: Orthopaedic Surgery

## 2021-01-16 ENCOUNTER — Other Ambulatory Visit: Payer: Self-pay

## 2021-01-16 ENCOUNTER — Ambulatory Visit (HOSPITAL_COMMUNITY)
Admission: RE | Admit: 2021-01-16 | Discharge: 2021-01-16 | Disposition: A | Discharge status: Home / Self Care | Payer: 59 | Source: Ambulatory Visit | Attending: Orthopaedic Surgery | Admitting: Orthopaedic Surgery

## 2021-01-16 DIAGNOSIS — M545 Low back pain, unspecified: Secondary | ICD-10-CM | POA: Insufficient documentation

## 2021-01-16 DIAGNOSIS — M47816 Spondylosis without myelopathy or radiculopathy, lumbar region: Secondary | ICD-10-CM | POA: Insufficient documentation

## 2021-01-16 DIAGNOSIS — M5137 Other intervertebral disc degeneration, lumbosacral region: Secondary | ICD-10-CM | POA: Insufficient documentation

## 2021-01-16 DIAGNOSIS — M47896 Other spondylosis, lumbar region: Secondary | ICD-10-CM | POA: Diagnosis not present

## 2021-01-16 DIAGNOSIS — G8929 Other chronic pain: Secondary | ICD-10-CM | POA: Insufficient documentation

## 2021-01-16 DIAGNOSIS — M48061 Spinal stenosis, lumbar region without neurogenic claudication: Secondary | ICD-10-CM | POA: Diagnosis not present

## 2021-01-18 ENCOUNTER — Other Ambulatory Visit: Payer: Self-pay

## 2021-01-18 ENCOUNTER — Ambulatory Visit (INDEPENDENT_AMBULATORY_CARE_PROVIDER_SITE_OTHER): Payer: 59

## 2021-01-18 DIAGNOSIS — J309 Allergic rhinitis, unspecified: Secondary | ICD-10-CM | POA: Diagnosis not present

## 2021-01-18 DIAGNOSIS — G8929 Other chronic pain: Secondary | ICD-10-CM

## 2021-01-18 DIAGNOSIS — M545 Low back pain, unspecified: Secondary | ICD-10-CM

## 2021-01-25 ENCOUNTER — Ambulatory Visit (INDEPENDENT_AMBULATORY_CARE_PROVIDER_SITE_OTHER): Payer: 59

## 2021-01-25 ENCOUNTER — Telehealth: Payer: Self-pay | Admitting: Physical Medicine and Rehabilitation

## 2021-01-25 DIAGNOSIS — J309 Allergic rhinitis, unspecified: Secondary | ICD-10-CM

## 2021-01-25 NOTE — Telephone Encounter (Signed)
Pt called stating she was referred to Dr.Newton and was told do to her insurance she wouldn't be able to get scheduled. Pt states she called and was able to get someone on the phone within 9 minutes. Pt would like our office to try reaching out again.   Insurance # [phone]

## 2021-01-26 NOTE — Telephone Encounter (Signed)
Pt not req Auth#. 

## 2021-01-26 NOTE — Telephone Encounter (Signed)
Called pt and sch 3/31

## 2021-02-03 ENCOUNTER — Ambulatory Visit (INDEPENDENT_AMBULATORY_CARE_PROVIDER_SITE_OTHER): Payer: 59

## 2021-02-03 DIAGNOSIS — J309 Allergic rhinitis, unspecified: Secondary | ICD-10-CM | POA: Diagnosis not present

## 2021-02-10 ENCOUNTER — Ambulatory Visit (INDEPENDENT_AMBULATORY_CARE_PROVIDER_SITE_OTHER): Payer: 59

## 2021-02-10 DIAGNOSIS — J309 Allergic rhinitis, unspecified: Secondary | ICD-10-CM | POA: Diagnosis not present

## 2021-02-15 ENCOUNTER — Ambulatory Visit (INDEPENDENT_AMBULATORY_CARE_PROVIDER_SITE_OTHER): Payer: 59

## 2021-02-15 DIAGNOSIS — J309 Allergic rhinitis, unspecified: Secondary | ICD-10-CM

## 2021-02-16 ENCOUNTER — Ambulatory Visit: Payer: Self-pay

## 2021-02-16 ENCOUNTER — Encounter: Payer: Self-pay | Admitting: Physical Medicine and Rehabilitation

## 2021-02-16 ENCOUNTER — Ambulatory Visit (INDEPENDENT_AMBULATORY_CARE_PROVIDER_SITE_OTHER): Payer: 59 | Admitting: Physical Medicine and Rehabilitation

## 2021-02-16 ENCOUNTER — Other Ambulatory Visit: Payer: Self-pay

## 2021-02-16 VITALS — BP 131/79 | HR 106

## 2021-02-16 DIAGNOSIS — M5416 Radiculopathy, lumbar region: Secondary | ICD-10-CM | POA: Diagnosis not present

## 2021-02-16 MED ORDER — METHYLPREDNISOLONE ACETATE 80 MG/ML IJ SUSP
40.0000 mg | Freq: Once | INTRAMUSCULAR | Status: AC
  Administered 2021-02-16: 40 mg

## 2021-02-16 NOTE — Patient Instructions (Signed)

## 2021-02-16 NOTE — Progress Notes (Signed)
S1 trans. Pt state lower back pain that travels to her right butt than down to her foot. Pt state sometime she feels numbness ans tingling from her butt to her right ankle. Pt state she take pain meds to help ease her pain.   Numeric Pain Rating Scale and Functional Assessment Average Pain 8   In the last MONTH (on 0-10 scale) has pain interfered with the following?  1. General activity like being  able to carry out your everyday physical activities such as walking, climbing stairs, carrying groceries, or moving a chair?  Rating(10)   +Driver, -BT, -Dye Allergies.

## 2021-02-24 ENCOUNTER — Ambulatory Visit (INDEPENDENT_AMBULATORY_CARE_PROVIDER_SITE_OTHER): Payer: 59

## 2021-02-24 DIAGNOSIS — J309 Allergic rhinitis, unspecified: Secondary | ICD-10-CM | POA: Diagnosis not present

## 2021-03-01 ENCOUNTER — Telehealth: Payer: Self-pay

## 2021-03-01 ENCOUNTER — Ambulatory Visit (INDEPENDENT_AMBULATORY_CARE_PROVIDER_SITE_OTHER): Payer: 59

## 2021-03-01 DIAGNOSIS — J309 Allergic rhinitis, unspecified: Secondary | ICD-10-CM

## 2021-03-01 NOTE — Telephone Encounter (Signed)
Pt called to inform Dr. Ernestina Patches that she hasn't noticed any changes since her past injection.  Please advise

## 2021-03-06 ENCOUNTER — Ambulatory Visit: Payer: 59 | Admitting: Pulmonary Disease

## 2021-03-06 NOTE — Telephone Encounter (Signed)
Please advise 

## 2021-03-07 NOTE — Telephone Encounter (Signed)
Pt has been sch 5/2

## 2021-03-10 ENCOUNTER — Ambulatory Visit (INDEPENDENT_AMBULATORY_CARE_PROVIDER_SITE_OTHER): Payer: 59 | Admitting: *Deleted

## 2021-03-10 DIAGNOSIS — J309 Allergic rhinitis, unspecified: Secondary | ICD-10-CM | POA: Diagnosis not present

## 2021-03-17 ENCOUNTER — Ambulatory Visit (INDEPENDENT_AMBULATORY_CARE_PROVIDER_SITE_OTHER): Payer: 59

## 2021-03-17 DIAGNOSIS — J309 Allergic rhinitis, unspecified: Secondary | ICD-10-CM | POA: Diagnosis not present

## 2021-03-17 NOTE — Progress Notes (Signed)
Christina Esparza - 60 y.o. female MRN [id number]  Date of birth: [birth year]  Office Visit Note: Visit Date: 02/16/2021 PCP: Sharilyn Sites, MD Referred by: Sharilyn Sites, MD  Subjective: Chief Complaint  Patient presents with  . Lower Back - Pain  . Right Leg - Pain  . Right Ankle - Tingling  . Right Foot - Tingling, Numbness   HPI:  Christina Esparza is a 60 y.o. female who comes in today at the request of Dr. Joni Fears for planned Right S1-2 Lumbar epidural steroid injection with fluoroscopic guidance.  The patient has failed conservative care including home exercise, medications, time and activity modification.  This injection will be diagnostic and hopefully therapeutic.  Please see requesting physician notes for further details and justification. MRI reviewed with images and spine model.  MRI reviewed in the note below.  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  There is lateral recess narrowing more right than left that could impact the L5 nerve root but her symptoms are pretty classic S1 to the bottom of the foot.  She also has a facet joint cyst on the left at L5-S1 but no real symptoms on the left.  We will complete right S1 transforaminal injection diagnostically.  Depending on relief would look at L5 versus facet joint block.    ROS Otherwise per HPI.  Assessment & Plan: Visit Diagnoses:    ICD-10-CM   1. Lumbar radiculopathy  M54.16 XR C-ARM NO REPORT    Epidural Steroid injection    methylPREDNISolone acetate (DEPO-MEDROL) injection 40 mg    Plan: No additional findings.   Meds & Orders:  Meds ordered this encounter  Medications  . methylPREDNISolone acetate (DEPO-MEDROL) injection 40 mg    Orders Placed This Encounter  Procedures  . XR C-ARM NO REPORT  . Epidural Steroid injection    Follow-up: Return if symptoms worsen or fail to improve.   Procedures: No  procedures performed  S1 Lumbosacral Transforaminal Epidural Steroid Injection - Sub-Pedicular Approach with Fluoroscopic Guidance   Patient: Christina Esparza      Date of Birth: 02-27-1961 MRN: [id number] PCP: Sharilyn Sites, MD      Visit Date: 02/16/2021   Universal Protocol:    Date/Time: 04/29/222:16 PM  Consent Given By: the patient  Position:  PRONE  Additional Comments: Vital signs were monitored before and after the procedure. Patient was prepped and draped in the usual sterile fashion. The correct patient, procedure, and site was verified.   Injection Procedure Details:  Procedure Site One Meds Administered:  Meds ordered this encounter  Medications  . methylPREDNISolone acetate (DEPO-MEDROL) injection 40 mg    Laterality: Right  Location/Site:  S1 Foramen   Needle size: 22 ga.  Needle type: Spinal  Needle Placement: Transforaminal  Findings:   -Comments: Excellent flow of contrast along the nerve, nerve root and into the epidural space.  Epidurogram: Contrast epidurogram showed no nerve root cut off or restricted flow pattern.  Procedure Details: After squaring off the sacral end-plate to get a true AP view, the C-arm was positioned so that the best possible view of the S1 foramen was visualized. The soft tissues overlying this structure were infiltrated with 2-3 ml. of 1% Lidocaine without Epinephrine.    The spinal needle was inserted toward the target using a "trajectory" view along the fluoroscope beam.  Under AP and lateral visualization, the  needle was advanced so it did not puncture dura. Biplanar projections were used to confirm position. Aspiration was confirmed to be negative for CSF and/or blood. A 1-2 ml. volume of Isovue-250 was injected and flow of contrast was noted at each level. Radiographs were obtained for documentation purposes.   After attaining the desired flow of contrast documented above, a 0.5 to 1.0 ml test dose of 0.25% Marcaine  was injected into each respective transforaminal space.  The patient was observed for 90 seconds post injection.  After no sensory deficits were reported, and normal lower extremity motor function was noted,   the above injectate was administered so that equal amounts of the injectate were placed at each foramen (level) into the transforaminal epidural space.   Additional Comments:  The patient tolerated the procedure well Dressing: Band-Aid with 2 x 2 sterile gauze    Post-procedure details: Patient was observed during the procedure. Post-procedure instructions were reviewed.  Patient left the clinic in stable condition.     Clinical History: MRI LUMBAR SPINE WITHOUT CONTRAST  TECHNIQUE: Multiplanar, multisequence MR imaging of the lumbar spine was performed. No intravenous contrast was administered.  COMPARISON:  None.  FINDINGS: Segmentation:  Standard.  Alignment: There is trace anterolisthesis L3 on L4 and 0.5 cm anterolisthesis L4 on L5 due to facet degenerative disease.  Vertebrae:  No fracture, evidence of discitis, or bone lesion.  Conus medullaris and cauda equina: Conus extends to the T12-L1 level. Conus and cauda equina appear normal.  Paraspinal and other soft tissues: Negative.  Disc levels:  T11-12 is imaged in the sagittal plane only and negative.  T12-L1: Negative.  L1-2: Shallow disc bulge and mild-to-moderate facet degenerative change. No stenosis.  L2-3: Shallow disc bulge and mild-to-moderate facet degenerative change with small effusions. There is also some ligamentum flavum thickening. The central spinal canal and neural foramina remain widely patent.  L3-4: Mild-to-moderate facet arthropathy, ligamentum flavum thickening and a shallow disc bulge. There is mild central canal and left foraminal narrowing. The right foramen is open.  L4-5: Bilateral facet degenerative disease is advanced on the right. There are small facet  joint effusions. The disc is uncovered with a shallow bulge. Narrowing in the subarticular recesses is worse on the right but could impact either descending L5 root. The left foramen is open. Moderate to moderately severe right foraminal stenosis is present.  L5-S1: Moderate to advanced bilateral facet degenerative change and a shallow disc bulge. There is a synovial cyst measuring 0.4 cm in diameter off the anterior aspect of the left facet in the foramen. The cyst does not appear to impact the exiting left L5 root.  IMPRESSION: Spondylosis is most advanced at L4-5 where right worse than left facet arthropathy results in 0.5 cm anterolisthesis. There is narrowing in the subarticular recesses at this level which could impact either L5 root, worse on the right. Moderate to moderately severe right foraminal narrowing is also seen.  Mild central canal and left foraminal narrowing at L3-4.  Moderate to advanced bilateral facet degenerative change L5-S1. There is a 0.4 cm in diameter synovial cyst off the anterior aspect of the left facets in the foramen but the cyst does not appear to impact the exiting left L5 root.   Electronically Signed   By: Inge Rise M.D.   On: 01/16/2021 15:15     Objective:  VS:  HT:    WT:   BMI:     BP:131/79  HR:(!) 106bpm  TEMP: ( )  RESP:  Physical Exam Vitals and nursing note reviewed.  Constitutional:      General: She is not in acute distress.    Appearance: Normal appearance. She is not ill-appearing.  HENT:     Head: Normocephalic and atraumatic.     Right Ear: External ear normal.     Left Ear: External ear normal.  Eyes:     Extraocular Movements: Extraocular movements intact.  Cardiovascular:     Rate and Rhythm: Normal rate.     Pulses: Normal pulses.  Pulmonary:     Effort: Pulmonary effort is normal. No respiratory distress.  Abdominal:     General: There is no distension.     Palpations: Abdomen is soft.   Musculoskeletal:        General: Tenderness present.     Cervical back: Neck supple.     Right lower leg: No edema.     Left lower leg: No edema.     Comments: Patient has good distal strength with no pain over the greater trochanters.  No clonus or focal weakness.  Skin:    Findings: No erythema, lesion or rash.  Neurological:     General: No focal deficit present.     Mental Status: She is alert and oriented to person, place, and time.     Sensory: No sensory deficit.     Motor: No weakness or abnormal muscle tone.     Coordination: Coordination normal.  Psychiatric:        Mood and Affect: Mood normal.        Behavior: Behavior normal.      Imaging: No results found.

## 2021-03-17 NOTE — Procedures (Signed)
S1 Lumbosacral Transforaminal Epidural Steroid Injection - Sub-Pedicular Approach with Fluoroscopic Guidance   Patient: Christina Esparza      Date of Birth: 03-09-1961 MRN: [id number] PCP: Sharilyn Sites, MD      Visit Date: 02/16/2021   Universal Protocol:    Date/Time: 04/29/222:16 PM  Consent Given By: the patient  Position:  PRONE  Additional Comments: Vital signs were monitored before and after the procedure. Patient was prepped and draped in the usual sterile fashion. The correct patient, procedure, and site was verified.   Injection Procedure Details:  Procedure Site One Meds Administered:  Meds ordered this encounter  Medications  . methylPREDNISolone acetate (DEPO-MEDROL) injection 40 mg    Laterality: Right  Location/Site:  S1 Foramen   Needle size: 22 ga.  Needle type: Spinal  Needle Placement: Transforaminal  Findings:   -Comments: Excellent flow of contrast along the nerve, nerve root and into the epidural space.  Epidurogram: Contrast epidurogram showed no nerve root cut off or restricted flow pattern.  Procedure Details: After squaring off the sacral end-plate to get a true AP view, the C-arm was positioned so that the best possible view of the S1 foramen was visualized. The soft tissues overlying this structure were infiltrated with 2-3 ml. of 1% Lidocaine without Epinephrine.    The spinal needle was inserted toward the target using a "trajectory" view along the fluoroscope beam.  Under AP and lateral visualization, the needle was advanced so it did not puncture dura. Biplanar projections were used to confirm position. Aspiration was confirmed to be negative for CSF and/or blood. A 1-2 ml. volume of Isovue-250 was injected and flow of contrast was noted at each level. Radiographs were obtained for documentation purposes.   After attaining the desired flow of contrast documented above, a 0.5 to 1.0 ml test dose of 0.25% Marcaine was injected into each  respective transforaminal space.  The patient was observed for 90 seconds post injection.  After no sensory deficits were reported, and normal lower extremity motor function was noted,   the above injectate was administered so that equal amounts of the injectate were placed at each foramen (level) into the transforaminal epidural space.   Additional Comments:  The patient tolerated the procedure well Dressing: Band-Aid with 2 x 2 sterile gauze    Post-procedure details: Patient was observed during the procedure. Post-procedure instructions were reviewed.  Patient left the clinic in stable condition.

## 2021-03-20 ENCOUNTER — Encounter: Payer: Self-pay | Admitting: Physical Medicine and Rehabilitation

## 2021-03-20 ENCOUNTER — Other Ambulatory Visit: Payer: Self-pay

## 2021-03-20 ENCOUNTER — Ambulatory Visit: Payer: Self-pay

## 2021-03-20 ENCOUNTER — Ambulatory Visit (INDEPENDENT_AMBULATORY_CARE_PROVIDER_SITE_OTHER): Payer: 59 | Admitting: Physical Medicine and Rehabilitation

## 2021-03-20 VITALS — BP 106/66 | HR 98

## 2021-03-20 DIAGNOSIS — M5416 Radiculopathy, lumbar region: Secondary | ICD-10-CM | POA: Diagnosis not present

## 2021-03-20 MED ORDER — BETAMETHASONE SOD PHOS & ACET 6 (3-3) MG/ML IJ SUSP
12.0000 mg | Freq: Once | INTRAMUSCULAR | Status: AC
  Administered 2021-03-20: 12 mg

## 2021-03-20 NOTE — Progress Notes (Signed)
Pt state lower back pain that travels to her right side down to her ankle. Pt state standing makes the pain worse. Pt state she takes over the counter pain meds.  Pt has hx of inj on 02/16/21 pt state didn't helped.  Numeric Pain Rating Scale and Functional Assessment Average Pain 7   In the last MONTH (on 0-10 scale) has pain interfered with the following?  1. General activity like being  able to carry out your everyday physical activities such as walking, climbing stairs, carrying groceries, or moving a chair?  Rating(8)   +Driver, -BT, -Dye Allergies.

## 2021-03-20 NOTE — Patient Instructions (Signed)

## 2021-03-20 NOTE — Progress Notes (Signed)
Christina Esparza - 60 y.o. female MRN [id number]  Date of birth: 1961/10/08  Office Visit Note: Visit Date: 03/20/2021 PCP: Sharilyn Sites, MD Referred by: Sharilyn Sites, MD  Subjective: Chief Complaint  Patient presents with  . Lower Back - Pain  . Right Ankle - Pain, Tingling  . Left Leg - Pain   HPI:  Christina Esparza is a 60 y.o. female who comes in today at the request of Dr. Joni Fears for planned Right L5-S1 Lumbar epidural steroid injection with fluoroscopic guidance.  The patient has failed conservative care including home exercise, medications, time and activity modification.  This injection will be diagnostic and hopefully therapeutic.  Please see requesting physician notes for further details and justification. MRI reviewed with images and spine model.  MRI reviewed in the note below. Prior transforaminal injection was helpful at S1. Will get her follow up with Dr. Durward Fortes. She may ned surgical referal if not helpful. Could consider facet joint block as there can be a facet joint syndrome with pain referral pattern similar to radiculitis.  Symptoms still seem to be a classic S1 dermatome.  ROS Otherwise per HPI.  Assessment & Plan: Visit Diagnoses:    ICD-10-CM   1. Lumbar radiculopathy  M54.16 XR C-ARM NO REPORT    Epidural Steroid injection    betamethasone acetate-betamethasone sodium phosphate (CELESTONE) injection 12 mg    Plan: No additional findings.   Meds & Orders:  Meds ordered this encounter  Medications  . betamethasone acetate-betamethasone sodium phosphate (CELESTONE) injection 12 mg    Orders Placed This Encounter  Procedures  . XR C-ARM NO REPORT  . Epidural Steroid injection    Follow-up: Return in about 2 weeks (around 04/03/2021) for Joni Fears, MD.   Procedures: No procedures performed  Lumbar Epidural Steroid Injection - Interlaminar Approach with Fluoroscopic Guidance  Patient: Christina Esparza      Date of Birth:  [birth year] MRN: [id number] PCP: Sharilyn Sites, MD      Visit Date: 03/20/2021   Universal Protocol:     Consent Given By: the patient  Position: PRONE  Additional Comments: Vital signs were monitored before and after the procedure. Patient was prepped and draped in the usual sterile fashion. The correct patient, procedure, and site was verified.   Injection Procedure Details:   Procedure diagnoses: Lumbar radiculopathy [M54.16]   Meds Administered:  Meds ordered this encounter  Medications  . betamethasone acetate-betamethasone sodium phosphate (CELESTONE) injection 12 mg     Laterality: Right  Location/Site:  L5-S1  Needle: 3.5 in., 20 ga. Tuohy  Needle Placement: Paramedian epidural  Findings:   -Comments: Excellent flow of contrast into the epidural space.  Procedure Details: Using a paramedian approach from the side mentioned above, the region overlying the inferior lamina was localized under fluoroscopic visualization and the soft tissues overlying this structure were infiltrated with 4 ml. of 1% Lidocaine without Epinephrine. The Tuohy needle was inserted into the epidural space using a paramedian approach.   The epidural space was localized using loss of resistance along with counter oblique bi-planar fluoroscopic views.  After negative aspirate for air, blood, and CSF, a 2 ml. volume of Isovue-250 was injected into the epidural space and the flow of contrast was observed. Radiographs were obtained for documentation purposes.    The injectate was administered into the level noted above.   Additional Comments:  The patient tolerated the procedure well Dressing: 2 x 2 sterile gauze and Band-Aid  Post-procedure details: Patient was observed during the procedure. Post-procedure instructions were reviewed.  Patient left the clinic in stable condition.     Clinical History: MRI LUMBAR SPINE WITHOUT CONTRAST  TECHNIQUE: Multiplanar, multisequence MR  imaging of the lumbar spine was performed. No intravenous contrast was administered.  COMPARISON:  None.  FINDINGS: Segmentation:  Standard.  Alignment: There is trace anterolisthesis L3 on L4 and 0.5 cm anterolisthesis L4 on L5 due to facet degenerative disease.  Vertebrae:  No fracture, evidence of discitis, or bone lesion.  Conus medullaris and cauda equina: Conus extends to the T12-L1 level. Conus and cauda equina appear normal.  Paraspinal and other soft tissues: Negative.  Disc levels:  T11-12 is imaged in the sagittal plane only and negative.  T12-L1: Negative.  L1-2: Shallow disc bulge and mild-to-moderate facet degenerative change. No stenosis.  L2-3: Shallow disc bulge and mild-to-moderate facet degenerative change with small effusions. There is also some ligamentum flavum thickening. The central spinal canal and neural foramina remain widely patent.  L3-4: Mild-to-moderate facet arthropathy, ligamentum flavum thickening and a shallow disc bulge. There is mild central canal and left foraminal narrowing. The right foramen is open.  L4-5: Bilateral facet degenerative disease is advanced on the right. There are small facet joint effusions. The disc is uncovered with a shallow bulge. Narrowing in the subarticular recesses is worse on the right but could impact either descending L5 root. The left foramen is open. Moderate to moderately severe right foraminal stenosis is present.  L5-S1: Moderate to advanced bilateral facet degenerative change and a shallow disc bulge. There is a synovial cyst measuring 0.4 cm in diameter off the anterior aspect of the left facet in the foramen. The cyst does not appear to impact the exiting left L5 root.  IMPRESSION: Spondylosis is most advanced at L4-5 where right worse than left facet arthropathy results in 0.5 cm anterolisthesis. There is narrowing in the subarticular recesses at this level which could impact  either L5 root, worse on the right. Moderate to moderately severe right foraminal narrowing is also seen.  Mild central canal and left foraminal narrowing at L3-4.  Moderate to advanced bilateral facet degenerative change L5-S1. There is a 0.4 cm in diameter synovial cyst off the anterior aspect of the left facets in the foramen but the cyst does not appear to impact the exiting left L5 root.   Electronically Signed   By: Inge Rise M.D.   On: 01/16/2021 15:15     Objective:  VS:  HT:    WT:   BMI:     BP:106/66  HR:98bpm  TEMP: ( )  RESP:  Physical Exam Vitals and nursing note reviewed.  Constitutional:      General: She is not in acute distress.    Appearance: Normal appearance. She is not ill-appearing.  HENT:     Head: Normocephalic and atraumatic.     Right Ear: External ear normal.     Left Ear: External ear normal.  Eyes:     Extraocular Movements: Extraocular movements intact.  Cardiovascular:     Rate and Rhythm: Normal rate.     Pulses: Normal pulses.  Pulmonary:     Effort: Pulmonary effort is normal. No respiratory distress.  Abdominal:     General: There is no distension.     Palpations: Abdomen is soft.  Musculoskeletal:        General: Tenderness present.     Cervical back: Neck supple.     Right lower  leg: No edema.     Left lower leg: No edema.     Comments: Patient has good distal strength with no pain over the greater trochanters.  No clonus or focal weakness.  Skin:    Findings: No erythema, lesion or rash.  Neurological:     General: No focal deficit present.     Mental Status: She is alert and oriented to person, place, and time.     Sensory: No sensory deficit.     Motor: No weakness or abnormal muscle tone.     Coordination: Coordination normal.  Psychiatric:        Mood and Affect: Mood normal.        Behavior: Behavior normal.      Imaging: No results found.

## 2021-03-20 NOTE — Procedures (Signed)
Lumbar Epidural Steroid Injection - Interlaminar Approach with Fluoroscopic Guidance  Patient: Christina Esparza      Date of Birth: 1961/10/20 MRN: [id number] PCP: Sharilyn Sites, MD      Visit Date: 03/20/2021   Universal Protocol:     Consent Given By: the patient  Position: PRONE  Additional Comments: Vital signs were monitored before and after the procedure. Patient was prepped and draped in the usual sterile fashion. The correct patient, procedure, and site was verified.   Injection Procedure Details:   Procedure diagnoses: Lumbar radiculopathy [M54.16]   Meds Administered:  Meds ordered this encounter  Medications  . betamethasone acetate-betamethasone sodium phosphate (CELESTONE) injection 12 mg     Laterality: Right  Location/Site:  L5-S1  Needle: 3.5 in., 20 ga. Tuohy  Needle Placement: Paramedian epidural  Findings:   -Comments: Excellent flow of contrast into the epidural space.  Procedure Details: Using a paramedian approach from the side mentioned above, the region overlying the inferior lamina was localized under fluoroscopic visualization and the soft tissues overlying this structure were infiltrated with 4 ml. of 1% Lidocaine without Epinephrine. The Tuohy needle was inserted into the epidural space using a paramedian approach.   The epidural space was localized using loss of resistance along with counter oblique bi-planar fluoroscopic views.  After negative aspirate for air, blood, and CSF, a 2 ml. volume of Isovue-250 was injected into the epidural space and the flow of contrast was observed. Radiographs were obtained for documentation purposes.    The injectate was administered into the level noted above.   Additional Comments:  The patient tolerated the procedure well Dressing: 2 x 2 sterile gauze and Band-Aid    Post-procedure details: Patient was observed during the procedure. Post-procedure instructions were reviewed.  Patient left the  clinic in stable condition.

## 2021-03-22 ENCOUNTER — Ambulatory Visit (INDEPENDENT_AMBULATORY_CARE_PROVIDER_SITE_OTHER): Payer: 59

## 2021-03-22 DIAGNOSIS — J309 Allergic rhinitis, unspecified: Secondary | ICD-10-CM | POA: Diagnosis not present

## 2021-03-23 ENCOUNTER — Telehealth: Payer: Self-pay | Admitting: Physical Medicine and Rehabilitation

## 2021-03-23 DIAGNOSIS — M180 Bilateral primary osteoarthritis of first carpometacarpal joints: Secondary | ICD-10-CM | POA: Insufficient documentation

## 2021-03-23 DIAGNOSIS — M25531 Pain in right wrist: Secondary | ICD-10-CM | POA: Insufficient documentation

## 2021-03-23 DIAGNOSIS — G5601 Carpal tunnel syndrome, right upper limb: Secondary | ICD-10-CM | POA: Insufficient documentation

## 2021-03-23 NOTE — Telephone Encounter (Signed)
Patient called. Says she has not felt right since the injection. Would like a call back. [phone]

## 2021-03-23 NOTE — Telephone Encounter (Signed)
Called pt and she state that she been feel foggy, Heart beating fast, glittering and depress yesterday. Today she feels dizzy and has been read the paper about side effects. Pt was last seen on Monday 5/2 right L5-S1 [place]. Please Advise

## 2021-03-23 NOTE — Telephone Encounter (Signed)
Likely from the steroid (cortisone) should be self limited over a few days, stay hydrated. They usual occur with oral prednisone but can happen sometimes with injection.

## 2021-03-23 NOTE — Telephone Encounter (Signed)
done

## 2021-03-24 ENCOUNTER — Ambulatory Visit (INDEPENDENT_AMBULATORY_CARE_PROVIDER_SITE_OTHER): Payer: 59 | Admitting: Pulmonary Disease

## 2021-03-24 ENCOUNTER — Other Ambulatory Visit: Payer: Self-pay

## 2021-03-24 ENCOUNTER — Encounter: Payer: Self-pay | Admitting: Pulmonary Disease

## 2021-03-24 VITALS — BP 140/80 | HR 104 | Ht 60.0 in | Wt 117.0 lb

## 2021-03-24 DIAGNOSIS — J849 Interstitial pulmonary disease, unspecified: Secondary | ICD-10-CM | POA: Diagnosis not present

## 2021-03-24 DIAGNOSIS — D649 Anemia, unspecified: Secondary | ICD-10-CM | POA: Diagnosis not present

## 2021-03-24 LAB — CBC WITH DIFFERENTIAL/PLATELET
Basophils Absolute: 0.1 10*3/uL (ref 0.0–0.1)
Basophils Relative: 1.7 % (ref 0.0–3.0)
Eosinophils Absolute: 0.2 10*3/uL (ref 0.0–0.7)
Eosinophils Relative: 3.5 % (ref 0.0–5.0)
HCT: 35.4 % — ABNORMAL LOW (ref 36.0–46.0)
Hemoglobin: 11.8 g/dL — ABNORMAL LOW (ref 12.0–15.0)
Lymphocytes Relative: 27.3 % (ref 12.0–46.0)
Lymphs Abs: 1.4 10*3/uL (ref 0.7–4.0)
MCHC: 33.3 g/dL (ref 30.0–36.0)
MCV: 95.8 fl (ref 78.0–100.0)
Monocytes Absolute: 0.5 10*3/uL (ref 0.1–1.0)
Monocytes Relative: 8.6 % (ref 3.0–12.0)
Neutro Abs: 3.1 10*3/uL (ref 1.4–7.7)
Neutrophils Relative %: 58.9 % (ref 43.0–77.0)
Platelets: 283 10*3/uL (ref 150.0–400.0)
RBC: 3.7 Mil/uL — ABNORMAL LOW (ref 3.87–5.11)
RDW: 14.9 % (ref 11.5–15.5)
WBC: 5.3 10*3/uL (ref 4.0–10.5)

## 2021-03-24 LAB — COMPREHENSIVE METABOLIC PANEL
ALT: 7 U/L (ref 0–35)
AST: 14 U/L (ref 0–37)
Albumin: 4.2 g/dL (ref 3.5–5.2)
Alkaline Phosphatase: 57 U/L (ref 39–117)
BUN: 13 mg/dL (ref 6–23)
CO2: 26 mEq/L (ref 19–32)
Calcium: 8.7 mg/dL (ref 8.4–10.5)
Chloride: 108 mEq/L (ref 96–112)
Creatinine, Ser: 0.91 mg/dL (ref 0.40–1.20)
GFR: 68.12 mL/min (ref >60.00)
Glucose, Bld: 68 mg/dL — ABNORMAL LOW (ref 70–99)
Potassium: 3.9 mEq/L (ref 3.5–5.1)
Sodium: 143 mEq/L (ref 135–145)
Total Bilirubin: 0.1 mg/dL — ABNORMAL LOW (ref 0.2–1.2)
Total Protein: 6.4 g/dL (ref 6.0–8.3)

## 2021-03-24 LAB — VITAMIN B12: Vitamin B-12: 155 pg/mL — ABNORMAL LOW (ref 211–911)

## 2021-03-24 LAB — FERRITIN: Ferritin: 12.5 ng/mL (ref 10.0–291.0)

## 2021-03-24 LAB — FOLATE: Folate: 6.6 ng/mL (ref >5.9)

## 2021-03-24 NOTE — Progress Notes (Signed)
[facility]  Chief Complaint  Patient presents with  . COPD    Follow up    Constitutional:  BP 140/80   Pulse (!) 104   Ht 5' (1.524 m)   Wt 117 lb (53.1 kg)   SpO2 98%   BMI 22.85 kg/m   Past Medical History:  Anxiety, Depression, Perennial allergic rhinitis  Past Surgical History:  Her  has a past surgical history that includes Tubal ligation; Colonoscopy (N/A, 06/17/2013); Esophagogastroduodenoscopy (N/A, 11/15/2015); Esophageal dilation (N/A, 11/15/2015); Nasal septoplasty w/ turbinoplasty (Bilateral, 11/18/2018); Esophagogastroduodenoscopy (N/A, 11/18/2019); and Esophageal brushing (11/18/2019).  Brief Summary:  Christina Esparza is a 60 y.o. female smoker for assessment of COPD.  She is heterozygote MZ for alpha 1 antitrypsin.      Subjective:   CBC with diff from 10/28/20 showed eos 0.2 K/uL, and Hb 10.7.  PFT from then showed mild obstruction with air trapping.  Chest xray from 08/29/20 showed hyperinflation with increased interstitial markings.  She gets episodes of feeling like she can get enough air in.  This happens a lot when she feels anxious.  She then gets more short of breath and feels more anxious.  She has some cough and wheeze, but not bad.  Trelegy has helped.    Physical Exam:   Appearance - well kempt, thin  ENMT - no sinus tenderness, no oral exudate, no LAN, Mallampati 2 airway, no stridor  Respiratory - equal breath sounds bilaterally, no wheezing or rales  CV - s1s2 regular rate and rhythm, no murmurs  Ext - no clubbing, no edema  Skin - no rashes  Psych - normal mood and affect    Pulmonary testing:   Spirometry 09/02/20 >> FEV1 1.47 (53%), FEV1% 59  PFT 10/28/20 >> FEV1 1.42 (63%), FEV1% 65, TLC 5.33 (115%), RV 3.10 (167%), DLCO 104%  A1AT 10/28/20 >> 117, MZ  Chest Imaging:   CT chest 11/09/19 >> coronary calcification, lungs clear  Social History:  She  reports that she has been  smoking cigarettes. She has a 20.00 pack-year smoking history. She has never used smokeless tobacco. She reports that she does not drink alcohol and does not use drugs.  Family History:  Her family history includes Colon polyps in her mother; Prostate cancer in her father.     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 - continue trelegy - prn albuterol - advised her to discuss with her family members about whether the need to get tested for alpha 1 antitrypsin  Anxiety. - discussed dynamic hyperinflation and techniques to use to alleviate this when she has more dyspnea - she will f/u with her PCP to discuss adjustments to her anxiolytic and antidepressant regimen  Dyspnea with increased interstitial markings on chest xray. - will arrange for high resolution CT chest to assess for interstitial lung disease and emphysema  Tobacco abuse. - reviewed options to help with smoking cessation  Anemia. - will repeat CBC, CMET, anemia panel  Perennial allergic rhinitis. - reaction to dogs, cats >> on allegen immunotherapy - followed by Dr. Salvatore Marvel [facility]  Time Spent Involved in Patient Care on Day of Examination:  37 minutes  Follow up:  Patient Instructions  Lab tests today  Will arrange for high resolution CT chest in [place]  Follow up in 2 months in [place] office   Medication List:   Allergies as of 03/24/2021      Reactions  Amitiza [lubiprostone] Itching   Patient states that she experienced a pin sticking sensation, prickling all over.   Linaclotide Nausea Only   Cat Hair Extract    Ceftin [cefuroxime] Itching   Codeine Nausea And Vomiting   Levaquin [levofloxacin In D5w]    dream   Penicillins Other (See Comments)   Felt like she was choking Did it involve swelling of the face/tongue/throat, SOB, or low BP? Unknown Did it involve sudden or severe rash/hives, skin peeling, or any reaction on the inside of your  mouth or nose? No Did you need to seek medical attention at a hospital or doctor's office? No When did it last happen?~10 years ago If all above answers are "NO", may proceed with cephalosporin use. Felt like she was choking Felt like she was choking Did it involve swelling of the face/tongue/throat, SOB, or low BP? Unknown Did it involve sudden or severe rash/hives, skin peeling, or any reaction on the inside of your mouth or nose? No Did you need to seek medical attention at a hospital or doctor's office? No When did it last happen?~10 years ago If all above answers are "NO", may proceed with cephalosporin use.      Medication List       Accurate as of Mar 24, 2021 12:45 PM. If you have any questions, ask your nurse or doctor.        STOP taking these medications   buPROPion 100 MG tablet Commonly known as: WELLBUTRIN Stopped by: Chesley Mires, MD   cyclobenzaprine 10 MG tablet Commonly known as: FLEXERIL Stopped by: Chesley Mires, MD   ipratropium-albuterol 0.5-2.5 (3) MG/3ML Soln Commonly known as: DUONEB Stopped by: Chesley Mires, MD     TAKE these medications   ALPRAZolam 1 MG tablet Commonly known as: XANAX Take 1 mg by mouth 3 (three) times daily as needed for anxiety.   azelastine 0.1 % nasal spray Commonly known as: ASTELIN Place 1 spray into both nostrils 2 (two) times daily. Use in each nostril as directed   omeprazole 40 MG capsule Commonly known as: PRILOSEC Take 1 capsule (40 mg total) by mouth 2 (two) times daily before a meal.   ProAir HFA 108 (90 Base) MCG/ACT inhaler Generic drug: albuterol Inhale 2 puffs into the lungs every 4 (four) hours as needed for wheezing or shortness of breath.   sertraline 50 MG tablet Commonly known as: ZOLOFT Take 150 mg by mouth every evening.   sertraline 100 MG tablet Commonly known as: ZOLOFT Take 150 mg by mouth daily.   SUMAtriptan 50 MG tablet Commonly known as: IMITREX Take by mouth.   Trelegy Ellipta  100-62.5-25 MCG/INH Aepb Generic drug: Fluticasone-Umeclidin-Vilant Inhale 1 puff into the lungs daily.       Signature:  Chesley Mires, MD [place] Pager - [phone] 03/24/2021, 12:45 PM

## 2021-03-24 NOTE — Patient Instructions (Signed)
Lab tests today  Will arrange for high resolution CT chest in [place]  Follow up in 2 months in [place] office

## 2021-03-25 LAB — IRON AND TIBC
Iron Saturation: 12 % — ABNORMAL LOW (ref 15–55)
Iron: 47 ug/dL (ref 27–139)
Total Iron Binding Capacity: 404 ug/dL (ref 250–450)
UIBC: 357 ug/dL (ref 118–369)

## 2021-03-27 NOTE — Progress Notes (Signed)
Called and went over lab results per Dr Halford Chessman with patient. All questions answered and patient expressed full understanding of results and Dr Juanetta Gosling recommendations. Patient stated she will follow up with PCP about anemia and low iron level. Nothing further needed at this time.

## 2021-03-28 ENCOUNTER — Telehealth: Payer: Self-pay | Admitting: Allergy & Immunology

## 2021-03-28 NOTE — Telephone Encounter (Signed)
Pt is taking robitussin, allegra, mucinex, and doing trelegy daily and albuterol if needed. When she spits up some with coughing it is clear no fever, no body aches and no chills

## 2021-03-28 NOTE — Telephone Encounter (Signed)
Patient states she is having an allergy flare up for a couple weeks now. Patient states she has had cough, stuffy ears, and some congestion. Patient states symptoms started when pollen was really bad a couple weeks ago. About a couple days ago she started to feel tightness in her chest, pt states she has seen her pulmonologist but she feels this is more allergy related. Patient would like to know if anything could be sent in for her until she is able to see Dr. Ernst Bowler in person.   [facility] ([address], [place])  Please advise.

## 2021-03-29 ENCOUNTER — Encounter: Payer: Self-pay | Admitting: Family

## 2021-03-29 ENCOUNTER — Ambulatory Visit (INDEPENDENT_AMBULATORY_CARE_PROVIDER_SITE_OTHER): Payer: 59 | Admitting: Family

## 2021-03-29 ENCOUNTER — Other Ambulatory Visit: Payer: Self-pay

## 2021-03-29 DIAGNOSIS — J449 Chronic obstructive pulmonary disease, unspecified: Secondary | ICD-10-CM

## 2021-03-29 DIAGNOSIS — J3089 Other allergic rhinitis: Secondary | ICD-10-CM | POA: Diagnosis not present

## 2021-03-29 DIAGNOSIS — J019 Acute sinusitis, unspecified: Secondary | ICD-10-CM

## 2021-03-29 MED ORDER — TRIAMCINOLONE ACETONIDE 55 MCG/ACT NA AERO: INHALATION_SPRAY | NASAL | 5 refills | Status: DC

## 2021-03-29 MED ORDER — DOXYCYCLINE MONOHYDRATE 100 MG PO TABS: 100.0000 mg | ORAL_TABLET | Freq: Two times a day (BID) | ORAL | 0 refills | Status: DC

## 2021-03-29 MED ORDER — PREDNISONE 10 MG PO TABS: ORAL_TABLET | ORAL | 0 refills | Status: DC

## 2021-03-29 NOTE — Patient Instructions (Addendum)
1. 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 - Rescue medications: albuterol 4 puffs every 4-6 hours as needed - Asthma control goals:  * Full participation in all desired activities (may need albuterol before activity) * Albuterol use two time or less a week on average (not counting use with activity) * Cough interfering with sleep two time or less a month * Oral steroids no more than once a year * No hospitalizations  2. Perennial allergic rhinitis (cat, dog) -Start taking Nasacort 1 to 2 sprays each nostril once a day as needed for stuffy nose.In the right nostril, point the applicator out toward the right ear. In the left nostril, point the applicator out toward the left ear.  Does know if this medication causes any problems - Continue with: nasal saline rinses, but use these more frequently when symptoms are worse Also continue Allegra 180 mg once a day as needed for runny nose/itching - Continue taking: Astelin (azelastine) 2 sprays per nostril 1-2 times daily as needed  - Continue with allergy shots at the same schedule, but hold off on getting your allergy injection this week until you get feeling better  3.  Acute sinus infection Start taking doxycycline 100 mg twice a day for 7 days Start prednisone 10 mg taking 2 tablets twice a day for 3 days, then on the fourth day take 2 tablets in the morning, then on the fifth day take 1 tablet and stop  If your lightheadedness/dizziness does not get better please contact your primary care physician.  Also please contact your primary care physician to discuss your concerns with iron and anemia.  Keep already scheduled follow-up appointment on April 26, 2021 at 11:15 with Dr. Ernst Bowler

## 2021-03-29 NOTE — Telephone Encounter (Signed)
Could Chrissie do a televisit today?  Salvatore Marvel, MD [facility]

## 2021-03-29 NOTE — Progress Notes (Signed)
RE: ELESHIA WOOLEY MRN: [id number] DOB: 13-May-1961 Date of Telemedicine Visit: 03/29/2021  Referring provider: Sharilyn Sites, MD Primary care provider: Sharilyn Sites, MD  Chief Complaint: Cough (Patient gave verbal consent to treat and bill insurance for this visit.)   Telemedicine Follow Up Visit via Telephone: I connected with [name] for a follow up on 03/29/21 by telephone and verified that I am speaking with the correct person using two identifiers.   I discussed the limitations, risks, security and privacy concerns of performing an evaluation and management service by telephone and the availability of in person appointments. I also discussed with the patient that there may be a patient responsible charge related to this service. The patient expressed understanding and agreed to proceed.  Patient is at home.  Provider is at the office.  Visit start time: 11:12 AM Visit end time: 11:37 AM Insurance consent/check in by: Tobie Lords Medical consent and medical assistant/nurse: Aileen Pilot.  History of Present Illness: She is a 60 y.o. female, who is being followed for COPD with overlap syndrome and perennial allergic rhinitis. Her previous allergy office visit was on January 06, 2021 with Dr. Ernst Bowler.   COPD with asthma overlap syndrome is reported as not well controlled with Trelegy 100 mcg 1 puff once a day and albuterol as needed.  She reports for the past couple weeks since the pollen has been high she has had a cough that started off as dry, but is now productive with clear sputum.  She also reports a little bit of tightness in her chest and some worsening of shortness of breath.  She denies any fever, chills, and wheezing.  Since her last office visit she has not required any trips to the emergency room or urgent care due to breathing problems.  She did have a steroid injection in her spine approximately a week and a half ago.  She has been using her albuterol inhaler  approximately 2-3 times a day for the past couple weeks.  Perennial allergic rhinitis is reported as not well controlled with saline rinses as needed, Allegra as needed, and azelastine nasal spray on most days.  She also receives allergy injections.  She is not sure for allergy injections are helping but she is in buildup.  She denies any large local reactions.  She reports that a couple weeks ago with the high pollen she began having sneezing, losing her voice, her ears feeling stopped up, her throat feeling raw from coughing, postnasal drip, and nasal congestion.  For the past week or so her rhinorrhea has been green in color.  Her voice is starting to get better but still has a crackle in it.  Her ears are stopped up but not as bad as they were.  She reports feeling dizzy and lightheaded.  She denies any fever or chills.  She reports that when she has tried Flonase nasal spray in the past and it made her head feel funny.  She is willing to try a different steroid nasal spray.  She mentions back in December she saw the nurse practitioner in the pulmonary group and they ordered some blood work, but never went over it.  She looked at this blood work noticed that her iron was low.  She started on iron.  She had questions about her lab work.  Instructed her to speak with her primary care physician about her concerns for low iron levels.  Assessment and Plan: Charma is a 60 y.o. female with:  Patient Instructions  1. 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 - Rescue medications: albuterol 4 puffs every 4-6 hours as needed - Asthma control goals:  * Full participation in all desired activities (may need albuterol before activity) * Albuterol use two time or less a week on average (not counting use with  activity) * Cough interfering with sleep two time or less a month * Oral steroids no more than once a year * No hospitalizations  2. Perennial allergic rhinitis (cat, dog) -Start taking Nasacort 1 to 2 sprays each nostril once a day as needed for stuffy nose.In the right nostril, point the applicator out toward the right ear. In the left nostril, point the applicator out toward the left ear.  Does know if this medication causes any problems - Continue with: nasal saline rinses, but use these more frequently when symptoms are worse Also continue Allegra 180 mg once a day as needed for runny nose/itching - Continue taking: Astelin (azelastine) 2 sprays per nostril 1-2 times daily as needed  - Continue with allergy shots at the same schedule, but hold off on getting your allergy injection this week until you get feeling better  3.  Acute sinus infection Start taking doxycycline 100 mg twice a day for 7 days Start prednisone 10 mg taking 2 tablets twice a day for 3 days, then on the fourth day take 2 tablets in the morning, then on the fifth day take 1 tablet and stop  If your lightheadedness/dizziness does not get better please contact your primary care physician.  Also please contact your primary care physician to discuss your concerns with iron and anemia.  Keep already scheduled follow-up appointment on April 26, 2021 at 11:15 with Dr. Ernst Bowler       Return in about 4 weeks (around 04/26/2021), or if symptoms worsen or fail to improve.  Meds ordered this encounter  Medications  . triamcinolone (NASACORT) 55 MCG/ACT AERO nasal inhaler    Sig: Place 2 sprays in each nostril once a day as needed for stuffy nose    Dispense:  1 each    Refill:  5  . predniSONE (DELTASONE) 10 MG tablet    Sig: Take prednisone 10 mg- 2 tablets twice a day for 3 days, then on the fourth day take 2 tablets in the morning, on the fifth day take 1 tablet and stop    Dispense:  9 tablet    Refill:  0  .  doxycycline (ADOXA) 100 MG tablet    Sig: Take 1 tablet (100 mg total) by mouth 2 (two) times daily.    Dispense:  14 tablet    Refill:  0   Lab Orders  No laboratory test(s) ordered today    Diagnostics: None.  Medication List:  Current Outpatient Medications  Medication Sig Dispense Refill  . ALPRAZolam (XANAX) 1 MG tablet Take 1 mg by mouth 3 (three) times daily as needed for anxiety.    Marland Kitchen doxycycline (ADOXA) 100 MG tablet Take 1 tablet (100 mg total) by mouth 2 (two) times daily. 14 tablet 0  . Fluticasone-Umeclidin-Vilant (TRELEGY ELLIPTA) 100-62.5-25 MCG/INH AEPB Inhale 1 puff into the lungs daily. 60 each 5  . predniSONE (DELTASONE) 10 MG tablet Take prednisone 10 mg- 2 tablets twice a day for 3 days, then on the fourth day  take 2 tablets in the morning, on the fifth day take 1 tablet and stop 9 tablet 0  . PROAIR HFA 108 (90 Base) MCG/ACT inhaler Inhale 2 puffs into the lungs every 4 (four) hours as needed for wheezing or shortness of breath.    . sertraline (ZOLOFT) 100 MG tablet Take 150 mg by mouth daily.    . sertraline (ZOLOFT) 50 MG tablet Take 150 mg by mouth every evening.    . SUMAtriptan (IMITREX) 50 MG tablet Take by mouth.    . triamcinolone (NASACORT) 55 MCG/ACT AERO nasal inhaler Place 2 sprays in each nostril once a day as needed for stuffy nose 1 each 5  . venlafaxine (EFFEXOR) 37.5 MG tablet Take 37.5 mg by mouth 2 (two) times daily.    Marland Kitchen azelastine (ASTELIN) 0.1 % nasal spray Place 1 spray into both nostrils 2 (two) times daily. Use in each nostril as directed 30 mL 11  . omeprazole (PRILOSEC) 40 MG capsule Take 1 capsule (40 mg total) by mouth 2 (two) times daily before a meal. (Patient not taking: Reported on 03/29/2021) 90 capsule 3   No current facility-administered medications for this visit.   Allergies: Allergies  Allergen Reactions  . Amitiza [Lubiprostone] Itching    Patient states that she experienced a pin sticking sensation, prickling all over.   . Linaclotide Nausea Only  . Cat Hair Extract   . Ceftin [Cefuroxime] Itching  . Codeine Nausea And Vomiting  . Levaquin [Levofloxacin In D5w]     dream  . Penicillins Other (See Comments)    Felt like she was choking Did it involve swelling of the face/tongue/throat, SOB, or low BP? Unknown Did it involve sudden or severe rash/hives, skin peeling, or any reaction on the inside of your mouth or nose? No Did you need to seek medical attention at a hospital or doctor's office? No When did it last happen?~10 years ago If all above answers are "NO", may proceed with cephalosporin use.  Felt like she was choking Felt like she was choking Did it involve swelling of the face/tongue/throat, SOB, or low BP? Unknown Did it involve sudden or severe rash/hives, skin peeling, or any reaction on the inside of your mouth or nose? No Did you need to seek medical attention at a hospital or doctor's office? No When did it last happen?~10 years ago If all above answers are "NO", may proceed with cephalosporin use.    I reviewed her past medical history, social history, family history, and environmental history and no significant changes have been reported from previous visit on 01/06/2021.  Review of Systems  Constitutional: Negative for chills and fever.  HENT: Positive for congestion, postnasal drip and rhinorrhea. Negative for sinus pressure and sinus pain.   Eyes:       Reports itchy watery eyes  Respiratory: Positive for cough, chest tightness and shortness of breath.   Cardiovascular: Negative for chest pain and palpitations.  Gastrointestinal:       Denies heartburn and reflux  Genitourinary: Negative for difficulty urinating.  Neurological: Positive for dizziness and light-headedness.       Reports a little bit of dizziness and lightheadedness   Objective: Physical Exam Not obtained as encounter was done via telephone.   Previous notes and tests were reviewed.  I discussed the  assessment and treatment plan with the patient. The patient was provided an opportunity to ask questions and all were answered. The patient agreed with the plan and demonstrated an understanding  of the instructions.   The patient was advised to call back or seek an in-person evaluation if the symptoms worsen or if the condition fails to improve as anticipated.  I provided 25 minutes of non-face-to-face time during this encounter.  It was my pleasure to participate in [facility]'s care today. Please feel free to contact me with any questions or concerns.   Sincerely,  Althea Charon, FNP

## 2021-03-29 NOTE — Telephone Encounter (Signed)
Patient has been scheduled for a televisit with Chrissie at 9:30 this morning 03/29/2021.

## 2021-04-03 ENCOUNTER — Ambulatory Visit (HOSPITAL_COMMUNITY): Payer: 59

## 2021-04-05 ENCOUNTER — Other Ambulatory Visit: Payer: Self-pay

## 2021-04-05 ENCOUNTER — Ambulatory Visit: Payer: 59 | Admitting: Allergy & Immunology

## 2021-04-05 ENCOUNTER — Ambulatory Visit (INDEPENDENT_AMBULATORY_CARE_PROVIDER_SITE_OTHER): Payer: 59 | Admitting: Orthopaedic Surgery

## 2021-04-05 ENCOUNTER — Encounter: Payer: Self-pay | Admitting: Orthopaedic Surgery

## 2021-04-05 DIAGNOSIS — M5441 Lumbago with sciatica, right side: Secondary | ICD-10-CM | POA: Diagnosis not present

## 2021-04-05 DIAGNOSIS — M545 Low back pain, unspecified: Secondary | ICD-10-CM | POA: Insufficient documentation

## 2021-04-05 DIAGNOSIS — G8929 Other chronic pain: Secondary | ICD-10-CM

## 2021-04-05 DIAGNOSIS — G5731 Lesion of lateral popliteal nerve, right lower limb: Secondary | ICD-10-CM | POA: Diagnosis not present

## 2021-04-05 NOTE — Progress Notes (Signed)
Office Visit Note   Patient: Christina Esparza           Date of Birth: 1961/03/02           MRN: [id number] Visit Date: 04/05/2021              Requested by: Sharilyn Sites, [address] [place],  [place] [address] PCP: Sharilyn Sites, MD   Assessment & Plan: Visit Diagnoses:  1. Chronic right-sided low back pain with right-sided sciatica   2. Peroneal neuropathy, right   Christina Esparza has had a chronic problem with her lumbar spine.  She has had x-rays consistent with osteoarthritis and a recent MRI scan that demonstrated several levels of foraminal stenosis but predominantly at L4-5 where there was an anterior listhesis and probable irritation of the L5 nerve root.  Dr. Ernestina Patches has performed several epidural steroid injections with considerable relief of her pain in her back buttock and even the lateral aspect of her right hip.  She is aware that she can call him at any time for follow-up injections or reevaluation.  In addition she has had a problem for several months with tingling into the dorsum and lateral aspect of her right foot.  By exam she has peroneal neuropathy with a positive Tinel's about 4 inches proximal to the tip of the lateral malleolus she has referred tingling into the dorsum of her foot.  Neurologically she is intact Long discussion regarding treatment options including diagnostic studies in either the Lyrica or gabapentin.  She prefer not to take any medicines but avoid pressure and give it time to see if it heals.  Plan: Follow-Up Instructions: Return if symptoms worsen or fail to improve.   Orders:  No orders of the defined types were placed in this encounter.  No orders of the defined types were placed in this encounter.     Procedures: No procedures performed   Clinical Data: No additional findings.   Subjective: Chief Complaint  Patient presents with  . Lower Back - Follow-up  Patient presents today for follow up on her lower back. She has  received two lower back injections with Dr.Newton. She said that they have helped her back pain. She states that her biggest issue today is lower lateral leg numbness and tingling that radiates into her foot on the right side. She said that she has been experiencing that for about two months.   HPI  Review of Systems   Objective: Vital Signs: Ht 5' (1.524 m)   Wt 117 lb (53.1 kg)   BMI 22.85 kg/m   Physical Exam Constitutional:      Appearance: She is well-developed.  Eyes:     Pupils: Pupils are equal, round, and reactive to light.  Pulmonary:     Effort: Pulmonary effort is normal.  Skin:    General: Skin is warm and dry.  Neurological:     Mental Status: She is alert and oriented to person, place, and time.  Psychiatric:        Behavior: Behavior normal.     Ortho Exam awake alert and oriented x3.  Comfortable sitting.  Straight leg raise negative bilaterally.  No pain over the lateral aspect of her right hip.  No pain with range of motion of either hip.  Does have a positive Tinel's over the peroneal nerve right side about 4 inches proximal to the lateral malleolar tip.  Motor and sensory exam intact.  Good pulses.  No masses identified.  Specialty Comments:  No specialty comments available.  Imaging: No results found.   PMFS History: Patient Active Problem List   Diagnosis Date Noted  . Low back pain 04/05/2021  . Peroneal neuropathy, right 04/05/2021  . Carpal tunnel syndrome on right 03/23/2021  . Primary osteoarthritis of both first carpometacarpal joints 03/23/2021  . Right wrist pain 03/23/2021  . Pain in right hip 12/28/2020  . Stage 2 moderate COPD by GOLD classification ([place]) 09/14/2020  . 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 Gastroesophageal reflux disease 10/22/2019  . GERD (gastroesophageal reflux disease) 10/21/2019  . Shortness of breath 10/21/2019  . Primary osteoarthritis of first carpometacarpal joint of right hand 09/17/2017  .  Anxiety state 08/24/2015   Past Medical History:  Diagnosis Date  . Anxiety   . Depression     Family History  Problem Relation Age of Onset  . Colon polyps Mother   . Prostate cancer Father   . Colon cancer Neg Hx     Past Surgical History:  Procedure Laterality Date  . COLONOSCOPY N/A 06/17/2013   Procedure: COLONOSCOPY;  Surgeon: Rogene Houston, MD;  [facility];  Service: Endoscopy;  Laterality: N/A;  730  . ESOPHAGEAL BRUSHING  11/18/2019   Procedure: ESOPHAGEAL BRUSHING;  Surgeon: Rogene Houston, MD;  [facility];  Service: Endoscopy;;  . ESOPHAGEAL DILATION N/A 11/15/2015   Procedure: ESOPHAGEAL DILATION;  Surgeon: Rogene Houston, MD;  [facility];  Service: Endoscopy;  Laterality: N/A;  . ESOPHAGOGASTRODUODENOSCOPY N/A 11/15/2015   Procedure: ESOPHAGOGASTRODUODENOSCOPY (EGD);  Surgeon: Rogene Houston, MD;  [facility];  Service: Endoscopy;  Laterality: N/A;  . ESOPHAGOGASTRODUODENOSCOPY N/A 11/18/2019   Procedure: ESOPHAGOGASTRODUODENOSCOPY (EGD);  Surgeon: Rogene Houston, MD;  [facility];  Service: Endoscopy;  Laterality: N/A;  2:25  . NASAL SEPTOPLASTY W/ TURBINOPLASTY Bilateral 11/18/2018   Procedure: NASAL SEPTOPLASTY WITH BILATERAL TURBINATE REDUCTION;  Surgeon: Leta Baptist, MD;  Location: [facility];  Service: ENT;  Laterality: Bilateral;  . TUBAL LIGATION     Social History   Occupational History  . Not on file  Tobacco Use  . Smoking status: Current Every Day Smoker    Packs/day: 1.00    Years: 20.00    Pack years: 20.00    Types: Cigarettes  . Smokeless tobacco: Never Used  . Tobacco comment: smokes 4-5 cigarettes per day 10/20/201  Vaping Use  . Vaping Use: Never used  Substance and Sexual Activity  . Alcohol use: No    Alcohol/week: 0.0 standard drinks  . Drug use: No  . Sexual activity: Not on file

## 2021-04-12 ENCOUNTER — Ambulatory Visit (INDEPENDENT_AMBULATORY_CARE_PROVIDER_SITE_OTHER): Payer: 59

## 2021-04-12 DIAGNOSIS — J309 Allergic rhinitis, unspecified: Secondary | ICD-10-CM | POA: Diagnosis not present

## 2021-04-19 ENCOUNTER — Ambulatory Visit (INDEPENDENT_AMBULATORY_CARE_PROVIDER_SITE_OTHER): Payer: 59

## 2021-04-19 DIAGNOSIS — J309 Allergic rhinitis, unspecified: Secondary | ICD-10-CM | POA: Diagnosis not present

## 2021-04-26 ENCOUNTER — Other Ambulatory Visit: Payer: Self-pay

## 2021-04-26 ENCOUNTER — Ambulatory Visit (INDEPENDENT_AMBULATORY_CARE_PROVIDER_SITE_OTHER): Payer: 59 | Admitting: Allergy & Immunology

## 2021-04-26 ENCOUNTER — Encounter: Payer: Self-pay | Admitting: Allergy & Immunology

## 2021-04-26 VITALS — BP 112/62 | HR 109 | Temp 98.6°F | Resp 16 | Ht 61.0 in | Wt 116.0 lb

## 2021-04-26 DIAGNOSIS — J3089 Other allergic rhinitis: Secondary | ICD-10-CM | POA: Diagnosis not present

## 2021-04-26 DIAGNOSIS — J449 Chronic obstructive pulmonary disease, unspecified: Secondary | ICD-10-CM

## 2021-04-26 DIAGNOSIS — J302 Other seasonal allergic rhinitis: Secondary | ICD-10-CM

## 2021-04-26 DIAGNOSIS — J309 Allergic rhinitis, unspecified: Secondary | ICD-10-CM

## 2021-04-26 DIAGNOSIS — J331 Polypoid sinus degeneration: Secondary | ICD-10-CM | POA: Diagnosis not present

## 2021-04-26 MED ORDER — AZELASTINE HCL 0.1 % NA SOLN: 1.0000 | Freq: Two times a day (BID) | NASAL | 5 refills | Status: DC

## 2021-04-26 NOTE — Patient Instructions (Addendum)
1. COPD with asthma overlap syndrome - Lung function looks stable.  - I think you definitely need to get some counseling to see if this breathing is related to your anxiety and stress that you are experiencing.  - We can consider adding an injectable breathing medication to help the Trelegy work better.  - Daily controller medication(s): Trelegy 113mcg one puff once daily - Prior to physical activity: albuterol 2 puffs 10-15 minutes before physical activity. - Rescue medications: albuterol 4 puffs every 4-6 hours as needed - Asthma control goals:  * Full participation in all desired activities (may need albuterol before activity) * Albuterol use two time or less a week on average (not counting use with activity) * Cough interfering with sleep two time or less a month * Oral steroids no more than once a year * No hospitalizations  2. Perennial allergic rhinitis (cat, dog) - We are going to change you to [facility] one spray per nostril daily (this will replace all of your nasal steroid sprays). - You can continue to use the Astelin twice daily as needed (this is the nasty tasting one). - Continue with: nasal saline rinses, but use these more frequently when symptoms are worse  - Continue with: Allegra 180 mg once a day as needed for runny nose/itching - Continue with allergy shots at the same schedule.  3. Return in about 3 months (around 07/27/2021).    Please inform us of any Emergency Department visits, hospitalizations, or changes in symptoms. Call us before going to the ED for breathing or allergy symptoms since we might be able to fit you in for a sick visit. Feel free to contact us anytime with any questions, problems, or concerns.  It was a pleasure to see you again today!  Websites that have reliable patient information: 1. American Academy of Asthma, Allergy, and Immunology: www.aaaai.org 2. Food Allergy Research and Education (FARE): foodallergy.org 3. Mothers of Asthmatics:  http://www.asthmacommunitynetwork.org 4. American College of Allergy, Asthma, and Immunology: www.acaai.org   COVID-19 Vaccine Information can be found at: ShippingScam.co.uk For questions related to vaccine distribution or appointments, please email [email or web address] or call [phone].   We realize that you might be concerned about having an allergic reaction to the COVID19 vaccines. To help with that concern, WE ARE OFFERING THE COVID19 VACCINES IN OUR OFFICE! Ask the front desk for dates!     "Like" [place] on Facebook and Instagram for our latest updates!      A healthy democracy works best when [employer] participate! Make sure you are registered to vote! If you have moved or changed any of your contact information, you will need to get this updated before voting!  In some cases, you MAY be able to register to vote online: [email or web address]

## 2021-04-26 NOTE — Progress Notes (Signed)
FOLLOW UP  Date of Service/Encounter:  04/26/21   Assessment:   COPD with asthma overlap  Perennial allergic rhinitis (cat, dog) - on allergen immunotherapy  Polypoid degeneration - starting Xhance  Multiple social stressors - with a history of underlying axiety  Plan/Recommendations:   1. COPD with asthma overlap syndrome - Lung function looks stable.  - I think you definitely need to get some counseling to see if this breathing is related to your anxiety and stress that you are experiencing.  - We can consider adding an injectable breathing medication to help the Trelegy work better.  - Daily controller medication(s): Trelegy 147mcg one puff once daily - Prior to physical activity: albuterol 2 puffs 10-15 minutes before physical activity. - Rescue medications: albuterol 4 puffs every 4-6 hours as needed - Asthma control goals:  * Full participation in all desired activities (may need albuterol before activity) * Albuterol use two time or less a week on average (not counting use with activity) * Cough interfering with sleep two time or less a month * Oral steroids no more than once a year * No hospitalizations  2. Perennial allergic rhinitis (cat, dog) - We are going to change you to [facility] one spray per nostril daily (this will replace all of your nasal steroid sprays). - You can continue to use the Astelin twice daily as needed (this is the nasty tasting one). - Continue with: nasal saline rinses, but use these more frequently when symptoms are worse  - Continue with: Allegra 180 mg once a day as needed for runny nose/itching - Continue with allergy shots at the same schedule.  3. Return in about 3 months (around 07/27/2021).   Subjective:   Christina Esparza is a 60 y.o. female presenting today for follow up of  Chief Complaint  Patient presents with  . Asthma    Christina Esparza has a history of the following: Patient Active Problem List   Diagnosis Date Noted   . Low back pain 04/05/2021  . Peroneal neuropathy, right 04/05/2021  . Carpal tunnel syndrome on right 03/23/2021  . Primary osteoarthritis of both first carpometacarpal joints 03/23/2021  . Right wrist pain 03/23/2021  . Pain in right hip 12/28/2020  . 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 Gastroesophageal reflux disease 10/22/2019  . GERD (gastroesophageal reflux disease) 10/21/2019  . Shortness of breath 10/21/2019  . Primary osteoarthritis of first carpometacarpal joint of right hand 09/17/2017  . Anxiety state 08/24/2015    History obtained from: chart review and patient.  Christina Esparza is a 60 y.o. female presenting for a follow up visit.  She was last seen in May 2022.  At that time, she was continued on Trelegy 1 puff once daily as well as albuterol as needed.  She was also started on a prednisone burst.  For her allergy management, continue with her allergen immunotherapy and started on Nasacort 1 to 2 sprays per nostril daily.  We also continued on Allegra as well as Astelin.  She was started on doxycycline twice daily for 7 days as well as a prednisone burst for sinusitis.  Since last visit, she unfortunately lost her father to either an aneurysm or an MI. CPR was performed for 30 minutes before his death was declared. She described this as "shocking". He had his funeral and he was buried right beside her mother who passed in 8 or 9 months ago. They are  still processing the entire thing and she tells me today that they have not really had time to grieve. Thankfully, she is the power of attorney and the assets have been managed already.   Asthma/Respiratory Symptom History: She remains on the Trelegy. She is unsure if it is helping. She reports that it is hard to take a deep breath. She feels that some of this is stress. She has not started any counseling or therapy. She has thought about doing that after her mother's  passing but she has not reached out to check on that. She continues to follow with Christina. Halford Esparza.  Allergic Rhinitis Symptom History: She has been having some recent dizziness. Shots are going well. She is not having any issues with it at this point. She was having some increased eye watering when she was working up initially but this has improved. She has eye drops that she uses as needed. She has been using the Nasacort and has been switching between the various nose sprays that she has. She has not needed antibiotics.   She was diagnosed with "mild" anemia. Evidently this was done by Christina. Halford Esparza, presumably to see whether she had elevated eosinophils (AEC was 200). Christina Esparza is working this up and she has a card for a stool hemoccult. She has been having some issues with stress. Evidently she has lost 20-30 pounds after her mother passed. Colonoscopies are up to date, per the patient.   Otherwise, there have been no changes to her past medical history, surgical history, family history, or social history.    Review of Systems  Constitutional: Negative.  Negative for chills, fever, malaise/fatigue and weight loss.  HENT: Positive for congestion. Negative for ear discharge, ear pain and sinus pain.   Eyes: Negative for pain, discharge and redness.  Respiratory: Positive for cough. Negative for sputum production, shortness of breath and wheezing.        Positive for difficulty catching her breath and taking a deep breath.  Cardiovascular: Negative.  Negative for chest pain and palpitations.  Gastrointestinal: Negative for abdominal pain, constipation, diarrhea, heartburn, nausea and vomiting.  Skin: Negative.  Negative for itching and rash.  Neurological: Negative for dizziness and headaches.  Endo/Heme/Allergies: Positive for environmental allergies. Does not bruise/bleed easily.       Objective:   Blood pressure 112/62, pulse (!) 109, temperature 98.6 F (37 C), temperature source Temporal, resp.  rate 16, height 5\' 1"  (1.549 m), weight 116 lb (52.6 kg), SpO2 96 %. Body mass index is 21.92 kg/m.   Physical Exam:  Physical Exam Constitutional:      Appearance: She is well-developed.  HENT:     Head: Normocephalic and atraumatic.     Right Ear: Tympanic membrane, ear canal and external ear normal. No laceration.     Left Ear: Tympanic membrane, ear canal and external ear normal.     Nose: No nasal deformity, septal deviation, mucosal edema or rhinorrhea.     Right Turbinates: Enlarged, swollen and pale.     Left Turbinates: Enlarged, swollen and pale.     Right Sinus: No maxillary sinus tenderness or frontal sinus tenderness.     Left Sinus: No maxillary sinus tenderness or frontal sinus tenderness.     Comments: Turbinates enlarged with quite a bit of mucous.  Polypoid degeneration noted bilaterally.     Mouth/Throat:     Lips: Pink.     Mouth: Mucous membranes are moist. Mucous membranes are not pale and not dry.  Pharynx: Uvula midline.  Eyes:     General:        Right eye: No discharge.        Left eye: No discharge.     Conjunctiva/sclera: Conjunctivae normal.     Right eye: Right conjunctiva is not injected. No chemosis.    Left eye: Left conjunctiva is not injected. No chemosis.    Pupils: Pupils are equal, round, and reactive to light.  Cardiovascular:     Rate and Rhythm: Normal rate and regular rhythm.     Heart sounds: Normal heart sounds.  Pulmonary:     Effort: Pulmonary effort is normal. No tachypnea, accessory muscle usage or respiratory distress.     Breath sounds: Normal breath sounds. No wheezing, rhonchi or rales.     Comments: Decreased air movement at the bases.  Chest:     Chest wall: No tenderness.  Lymphadenopathy:     Cervical: No cervical adenopathy.  Skin:    Coloration: Skin is not pale.     Findings: No abrasion, erythema, petechiae or rash. Rash is not papular, urticarial or vesicular.  Neurological:     Mental Status: She is alert.   Psychiatric:        Behavior: Behavior is cooperative.      Diagnostic studies:    Spirometry: results abnormal (FEV1: 1.06/54%, FVC: 1.80/72%, FEV1/FVC: 59%).    Spirometry consistent with mixed obstructive and restrictive disease.  Overall, values are stable.  Allergy Studies: none      Salvatore Marvel, MD  Allergy and [facility]

## 2021-04-27 ENCOUNTER — Ambulatory Visit (HOSPITAL_COMMUNITY)
Admission: RE | Admit: 2021-04-27 | Discharge: 2021-04-27 | Disposition: A | Discharge status: Home / Self Care | Payer: 59 | Source: Ambulatory Visit | Attending: Pulmonary Disease | Admitting: Pulmonary Disease

## 2021-04-27 DIAGNOSIS — J849 Interstitial pulmonary disease, unspecified: Secondary | ICD-10-CM | POA: Diagnosis present

## 2021-05-02 NOTE — Progress Notes (Signed)
Called and went over CT results per Dr Halford Chessman with patient. All questions answered and patient expressed full understanding of results and Dr Juanetta Gosling recommendation. Routed result to PCP per patient request. Patient states she will call PCP and/or will discuss more with Dr Halford Chessman at next office visit on 06/19/21.

## 2021-05-05 ENCOUNTER — Ambulatory Visit (INDEPENDENT_AMBULATORY_CARE_PROVIDER_SITE_OTHER): Payer: 59

## 2021-05-05 DIAGNOSIS — J309 Allergic rhinitis, unspecified: Secondary | ICD-10-CM | POA: Diagnosis not present

## 2021-05-10 ENCOUNTER — Telehealth: Payer: Self-pay | Admitting: Allergy & Immunology

## 2021-05-10 NOTE — Telephone Encounter (Signed)
Patient called stating Trelegy does not seem to be working for her. Patient has been using it as directed and she had even stopped taking it a couple times to see if it made a difference however she has not seen a difference. Patient was wondering if she could be prescribed anything else that would help her with her asthma.   Patient is also using the xhance sample that was given to her. Patient states this is helping her out the most. Patient would like a prescription sent in for xhance.   [facility] - [address], [place]  Best contact number for patient: [phone]  Please advise

## 2021-05-12 ENCOUNTER — Ambulatory Visit (INDEPENDENT_AMBULATORY_CARE_PROVIDER_SITE_OTHER): Payer: 59

## 2021-05-12 DIAGNOSIS — J309 Allergic rhinitis, unspecified: Secondary | ICD-10-CM | POA: Diagnosis not present

## 2021-05-12 NOTE — Telephone Encounter (Signed)
She was on Breztri but she had a sore throat from that. I wonder if she is using her inhaler appropriately? Can we go over that when she comes in for her shot next time? I think she has not come this week, so maybe she is coming today.  We could try using nebulized medications such as Pulmicort 0.5 mg BID combined with Perforomist 0.5 mg BID. Does she have a nebulizer?   Salvatore Marvel, MD Allergy and [facility] [place]

## 2021-05-12 NOTE — Telephone Encounter (Signed)
Christina Esparza was done and patient received her allergy injection. Per Dr. Ernst Bowler we will add Spiriva 2.5 (2) puffs once daily. Patient received a sample today and demonstrated correct use.

## 2021-05-12 NOTE — Telephone Encounter (Signed)
In person, she looks about the same as she always does.  She does not appear to be short of breath.  I did ask her about her counseling.  She has had 1 session that was around 45 minutes following over the phone.  She was provided with some exercises to help with dealing with the grief including some journaling.  She is not sure about any follow-up.  Her spirometry is largely unchanged from that on June 8.  On exam, she does not sound wheezy and there are no crackles.  She points to her upper throat extending up to her mastoid process on both sides is where the pain is.  She has been to the ear nose and throat in the past.  She saw Dr. Benjamine Mola and had a nasal septal deviation fixed.  She also another otolaryngologist in [place], although she cannot remember her name.  1 would think that they have always done her throat using a laryngoscopy, but she was not able to confirm that this happened.  After talking with her, it seems that she is describing what might just be oral thrush irritation from the inhaled steroid.  We are going to trial her on Spiriva 2.5 mcg 2 puffs once daily.  Sample was provided and she will give [place] an update.  Salvatore Marvel, MD Allergy and [place] [facility] [place]

## 2021-05-12 NOTE — Telephone Encounter (Signed)
I answered the phone and talked to her this morning. She is having a hard time getting her breath. She is having a raw throat.  She is coming in for her shot later today. We will get a breathing test to see what that looks like and make recommendations from there.  Salvatore Marvel, MD Allergy and [facility] [place]

## 2021-05-14 ENCOUNTER — Encounter: Payer: Self-pay | Admitting: Allergy & Immunology

## 2021-05-17 ENCOUNTER — Telehealth: Payer: Self-pay | Admitting: Allergy & Immunology

## 2021-05-17 ENCOUNTER — Telehealth: Payer: Self-pay

## 2021-05-17 ENCOUNTER — Ambulatory Visit (INDEPENDENT_AMBULATORY_CARE_PROVIDER_SITE_OTHER): Payer: 59

## 2021-05-17 DIAGNOSIS — J309 Allergic rhinitis, unspecified: Secondary | ICD-10-CM

## 2021-05-17 DIAGNOSIS — J331 Polypoid sinus degeneration: Secondary | ICD-10-CM

## 2021-05-17 DIAGNOSIS — R059 Cough, unspecified: Secondary | ICD-10-CM

## 2021-05-17 MED ORDER — SPIRIVA RESPIMAT 2.5 MCG/ACT IN AERS: 2.0000 | INHALATION_SPRAY | RESPIRATORY_TRACT | 3 refills | Status: DC

## 2021-05-17 MED ORDER — XHANCE 93 MCG/ACT NA EXHU: 1.0000 | INHALANT_SUSPENSION | Freq: Every day | NASAL | 5 refills | Status: DC

## 2021-05-17 MED ORDER — HYDROXYZINE HCL 25 MG PO TABS: 25.0000 mg | ORAL_TABLET | Freq: Three times a day (TID) | ORAL | 3 refills | Status: DC | PRN

## 2021-05-17 NOTE — Addendum Note (Signed)
Addended by: Jaynie Crumble on: 05/17/2021 11:28 AM   Modules accepted: Orders

## 2021-05-17 NOTE — Telephone Encounter (Signed)
Patient presented with complaints of SOB and what sounds like a globus sensation. On exam she is normal. There is no wheezing. Spirometry actually looks better than last time. Pulse ox is normal at 97% and HR is slightly tachycardic.  She does report that the Truett Perna is doing well. She needs a script sent in for that. I am also adding on hydroxyzine 25mg  every 8 hours as needed. This might help with her anxiety as well.  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  ENT referral pending. She does NOT want to see Dr. Benjamine Mola.  Salvatore Marvel, MD Allergy and [place] [facility] [place]

## 2021-05-17 NOTE — Addendum Note (Signed)
Addended by: Jaynie Crumble on: 05/17/2021 02:21 PM   Modules accepted: Orders

## 2021-05-17 NOTE — Telephone Encounter (Signed)
Patient called requesting a refill of Spiriva. Refill was sent to [facility] on [address]

## 2021-05-17 NOTE — Telephone Encounter (Signed)
Christina Esparza has been sent to Christina Esparza, patient has been informed. Dee please look at Dr. Gillermina Hu note regarding ENT.

## 2021-05-17 NOTE — Addendum Note (Signed)
Addended by: Jaynie Crumble on: 05/17/2021 04:43 PM   Modules accepted: Orders

## 2021-05-18 ENCOUNTER — Telehealth: Payer: Self-pay

## 2021-05-18 NOTE — Telephone Encounter (Signed)
Dr. Ernst Bowler spoke with patient and did a spiro. See telephone encounter for more information.

## 2021-05-18 NOTE — Telephone Encounter (Signed)
Marqui from [place] called to verify [facility] prescription.  1 Spray per nostril daily

## 2021-05-18 NOTE — Telephone Encounter (Signed)
Hey Dr Ernst Bowler,  Which diagnosis would you like me to use for the ENT Referral?  Thanks

## 2021-05-24 NOTE — Telephone Encounter (Signed)
Cough (R05.90) should be fine.  Salvatore Marvel, MD Allergy and [place] [facility] [place]

## 2021-05-26 ENCOUNTER — Ambulatory Visit (INDEPENDENT_AMBULATORY_CARE_PROVIDER_SITE_OTHER): Payer: 59

## 2021-05-26 DIAGNOSIS — J309 Allergic rhinitis, unspecified: Secondary | ICD-10-CM | POA: Diagnosis not present

## 2021-06-02 ENCOUNTER — Ambulatory Visit (INDEPENDENT_AMBULATORY_CARE_PROVIDER_SITE_OTHER): Payer: 59

## 2021-06-02 DIAGNOSIS — J309 Allergic rhinitis, unspecified: Secondary | ICD-10-CM | POA: Diagnosis not present

## 2021-06-02 NOTE — Telephone Encounter (Signed)
Patient scheduled 06/16/2021 @1030am  with Dr. West Carbo.

## 2021-06-14 ENCOUNTER — Telehealth: Payer: Self-pay | Admitting: *Deleted

## 2021-06-14 ENCOUNTER — Ambulatory Visit: Payer: Self-pay | Admitting: *Deleted

## 2021-06-14 NOTE — Telephone Encounter (Signed)
Patient came in to get her allergy injection today and [place], she stated that Wednesday she was starting to develop a cough and "spit" up green mucous and has been having congestion. She was wondering if she could get an antibiotic. I did not give her an injection today given her symptoms. She did state that she tested for COVID and it was negative. Per [facility] Dr. Ernst Bowler stated that she would need to be seen in office for any further issues. You did have a 2:30 opening this afternoon, called the patient and left a voicemail asking for her to return call to see if she would like to come in at that time today.

## 2021-06-14 NOTE — Telephone Encounter (Signed)
Agree please see if she is available to come in today at 2:30 pm. Thank you.

## 2021-06-14 NOTE — Telephone Encounter (Signed)
Patient called back and stated that she would not be able to come in today and she has an appointment with ENT on Friday. Advised that if she needs anything sooner to please contact her Primary Care Doctor. Patient verbalized understanding.

## 2021-06-19 ENCOUNTER — Ambulatory Visit (INDEPENDENT_AMBULATORY_CARE_PROVIDER_SITE_OTHER): Payer: 59 | Admitting: Pulmonary Disease

## 2021-06-19 ENCOUNTER — Encounter: Payer: Self-pay | Admitting: Pulmonary Disease

## 2021-06-19 ENCOUNTER — Other Ambulatory Visit: Payer: Self-pay

## 2021-06-19 VITALS — BP 120/70 | HR 87 | Temp 96.9°F | Ht 61.0 in | Wt 114.0 lb

## 2021-06-19 DIAGNOSIS — J432 Centrilobular emphysema: Secondary | ICD-10-CM | POA: Diagnosis not present

## 2021-06-19 DIAGNOSIS — Z720 Tobacco use: Secondary | ICD-10-CM

## 2021-06-19 MED ORDER — METHYLPREDNISOLONE ACETATE 80 MG/ML IJ SUSP
80.0000 mg | Freq: Once | INTRAMUSCULAR | Status: AC
  Administered 2021-06-19: 80 mg via INTRAMUSCULAR

## 2021-06-19 MED ORDER — ANORO ELLIPTA 62.5-25 MCG/INH IN AEPB: 1.0000 | INHALATION_SPRAY | Freq: Every day | RESPIRATORY_TRACT | 5 refills | Status: DC

## 2021-06-19 NOTE — Patient Instructions (Signed)
Depo-medrol 80 mg injection today  Anoro one puff daily  Follow up in 4 months

## 2021-06-19 NOTE — Progress Notes (Signed)
Christina Esparza, Christina Esparza, Christina Esparza  Chief Complaint  Patient presents with   Follow-up    Patient states that when she takes Trelegy Christina Spiriva it makes her dizzy. Patient states that her breathing is better since starting [place].    Constitutional:  BP 120/70 (BP Location: Left Arm, Patient Position: Sitting, Cuff Size: Normal)   Pulse 87   Temp (!) 96.9 F (36.1 C) (Oral)   Ht '5\' 1"'$  (1.549 m)   Wt 114 lb (51.7 kg)   SpO2 97%   BMI 21.54 kg/m   Past Medical History:  Anxiety, Depression, Perennial allergic rhinitis  Past Surgical History:  Her  has a past surgical history that includes Tubal ligation; Colonoscopy (N/A, 06/17/2013); Esophagogastroduodenoscopy (N/A, 11/15/2015); Esophageal dilation (N/A, 11/15/2015); Nasal septoplasty w/ turbinoplasty (Bilateral, 11/18/2018); Esophagogastroduodenoscopy (N/A, 11/18/2019); Christina Esophageal brushing (11/18/2019).  Brief Summary:  Christina Esparza is a 60 y.o. female smoker for assessment of COPD.  She is heterozygote MZ for alpha 1 antitrypsin.      Subjective:   CT chest showed changes of emphysema Christina benign nodules.  Wasn't able to tolerate spiriva or trelegy.  Both caused her to feeling funny in her head.  She is concerned about steroid inhalers.  Still smoking about 1/2 to 1 ppd.  She has lots of stress at home Christina not ready to quit yet.    She has more cough Christina chest congestion recently.  Physical Exam:   Appearance - well kempt   ENMT - no sinus tenderness, no oral exudate, no LAN, Mallampati 2 airway, no stridor  Respiratory - equal breath sounds bilaterally, no wheezing or rales  CV - s1s2 regular rate Christina rhythm, no murmurs  Ext - no clubbing, no edema  Skin - no rashes  Psych - normal mood Christina affect     Esparza testing:  Spirometry 09/02/20 >> FEV1 1.47 (53%), FEV1% 59 PFT 10/28/20 >> FEV1 1.42 (63%), FEV1% 65, TLC 5.33 (115%), RV 3.10 (167%), DLCO 104% A1AT 10/28/20 >> 117,  MZ  Chest Imaging:  CT chest 11/09/19 >> coronary calcification, lungs clear HRCT chest 04/29/21 >> centrilobular emphysema, few nodules up to 4 mm (benign)  Social History:  She  reports that she has been smoking cigarettes. She has a 20.00 pack-year smoking history. She has never used smokeless tobacco. She reports that she does not drink alcohol Christina does not use drugs.  Family History:  Her family history includes Colon polyps in her mother; Heart attack in her father; Prostate cancer in her father.     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 - intolerant of spiriva Christina trelegy - she is reluctant to use steroid inhalers - will have her try anoro - prn albuterol - she has slow to resolve recent exacerbation >> will give her depomedrol 80 mg injection today (patient preference over prednisone taper)  Anxiety. - discussed dynamic hyperinflation Christina techniques to use to alleviate this when she has more dyspnea - f/u with PCP  Tobacco abuse. - reviewed options to help with smoking cessation - discussed how chantix or bupropion can still be used in patients with history of mood disorders - she will call when she is ready to quit smoking  Perennial allergic rhinitis. - reaction to dogs, cats >> on allegen immunotherapy - recently started on xhance - followed by Dr. Salvatore Marvel with [facility]  Time Spent Involved in Patient Esparza on Day of Examination:  33 minutes  Follow up:   Patient Instructions  Depo-medrol 80 mg injection today  Anoro one puff daily  Follow up in 4 months  Medication List:   Allergies as of 06/19/2021       Reactions   Amitiza [lubiprostone] Itching   Patient states that she experienced a pin sticking sensation, prickling all over.   Linaclotide Nausea Only   Cat Hair Extract    Ceftin [cefuroxime] Itching   Codeine Nausea Christina Vomiting   Levaquin [levofloxacin In D5w]    dream   Penicillins Other (See  Comments)   Felt like she was choking Did it involve swelling of the face/tongue/throat, SOB, or low BP? Unknown Did it involve sudden or severe rash/hives, skin peeling, or any reaction on the inside of your mouth or nose? No Did you need to seek medical attention at a hospital or doctor's office? No When did it last happen?~10 years ago If all above answers are "NO", may proceed with cephalosporin use. Felt like she was choking Felt like she was choking Did it involve swelling of the face/tongue/throat, SOB, or low BP? Unknown Did it involve sudden or severe rash/hives, skin peeling, or any reaction on the inside of your mouth or nose? No Did you need to seek medical attention at a hospital or doctor's office? No When did it last happen?~10 years ago If all above answers are "NO", may proceed with cephalosporin use.        Medication List        Accurate as of June 19, 2021 11:15 AM. If you have any questions, ask your nurse or doctor.          STOP taking these medications    doxycycline 100 MG tablet Commonly known as: ADOXA Stopped by: Chesley Mires, MD   predniSONE 10 MG tablet Commonly known as: DELTASONE Stopped by: Chesley Mires, MD   Spiriva Respimat 2.5 MCG/ACT Aers Generic drug: Tiotropium Bromide Monohydrate Stopped by: Chesley Mires, MD   Trelegy Ellipta 100-62.5-25 MCG/INH Aepb Generic drug: Fluticasone-Umeclidin-Vilant Stopped by: Chesley Mires, MD   triamcinolone 55 MCG/ACT Aero nasal inhaler Commonly known as: NASACORT Stopped by: Chesley Mires, MD   venlafaxine 37.5 MG tablet Commonly known as: EFFEXOR Stopped by: Chesley Mires, MD       TAKE these medications    ALPRAZolam 1 MG tablet Commonly known as: XANAX Take 1 mg by mouth 3 (three) times daily as needed for anxiety.   Anoro Ellipta 62.5-25 MCG/INH Aepb Generic drug: umeclidinium-vilanterol Inhale 1 puff into the lungs daily. Started by: Chesley Mires, MD   azelastine 0.1 % nasal  spray Commonly known as: ASTELIN Place 1 spray into both nostrils 2 (two) times daily. Use in each nostril as directed   ferrous sulfate 325 (65 FE) MG tablet Take 325 mg by mouth daily with breakfast.   omeprazole 40 MG capsule Commonly known as: PRILOSEC Take 1 capsule (40 mg total) by mouth 2 (two) times daily before a meal.   ProAir HFA 108 (90 Base) MCG/ACT inhaler Generic drug: albuterol Inhale 2 puffs into the lungs every 4 (four) hours as needed for wheezing or shortness of breath.   sertraline 50 MG tablet Commonly known as: ZOLOFT Take 150 mg by mouth every evening.   sertraline 100 MG tablet Commonly known as: ZOLOFT Take 150 mg by mouth daily.   SUMAtriptan 50 MG tablet Commonly known as: IMITREX Take by mouth.   Xhance 93 MCG/ACT Exhu Generic drug: Fluticasone Propionate Place 1 spray  into the nose daily.        Signature:  Chesley Mires, MD [place] Pager - [phone] 06/19/2021, 11:15 AM

## 2021-06-20 ENCOUNTER — Encounter: Payer: Self-pay | Admitting: *Deleted

## 2021-06-20 ENCOUNTER — Telehealth: Payer: Self-pay | Admitting: Pulmonary Disease

## 2021-06-20 NOTE — Telephone Encounter (Signed)
I have called and spoke with pt and she is aware that there is a discount card for the anoro.  I advised her that I would send her the information via my chart message.  Nothing further is needed.

## 2021-06-23 ENCOUNTER — Ambulatory Visit (INDEPENDENT_AMBULATORY_CARE_PROVIDER_SITE_OTHER): Payer: 59

## 2021-06-23 DIAGNOSIS — J309 Allergic rhinitis, unspecified: Secondary | ICD-10-CM

## 2021-06-23 MED ORDER — PAZEO 0.7 % OP SOLN: 1.0000 [drp] | Freq: Every day | OPHTHALMIC | 5 refills | Status: DC | PRN

## 2021-06-23 NOTE — Progress Notes (Signed)
Patient came in to get her allergy injections, Patient stated she thought Dr. Ernst Bowler was going to send in an eye drop for her itchy watery eyes. I look at all of her after visit summaries so far this year however didn't see anything in regards to an eye drop. Spoke with Dr. Ernst Bowler he stated to send in [place] 1 drop daily as needed. Patient has been informed.

## 2021-06-28 ENCOUNTER — Ambulatory Visit (INDEPENDENT_AMBULATORY_CARE_PROVIDER_SITE_OTHER): Payer: 59 | Admitting: *Deleted

## 2021-06-28 ENCOUNTER — Telehealth: Payer: Self-pay | Admitting: *Deleted

## 2021-06-28 DIAGNOSIS — J309 Allergic rhinitis, unspecified: Secondary | ICD-10-CM | POA: Diagnosis not present

## 2021-06-28 NOTE — Telephone Encounter (Signed)
Patient came in for her allergy injection and stated that the Pazeo eye drops were not available as a prescription because since it is over the counter. She was wondering if you could send in a different eye drop that could be prescription. Thank You.

## 2021-06-29 ENCOUNTER — Other Ambulatory Visit: Payer: Self-pay | Admitting: *Deleted

## 2021-06-29 MED ORDER — BEPOTASTINE BESILATE 1.5 % OP SOLN: 1.0000 [drp] | Freq: Two times a day (BID) | OPHTHALMIC | 5 refills | Status: DC | PRN

## 2021-06-29 MED ORDER — BEPREVE 1.5 % OP SOLN: 1.0000 [drp] | Freq: Two times a day (BID) | OPHTHALMIC | 5 refills | Status: DC | PRN

## 2021-06-29 NOTE — Addendum Note (Signed)
Addended by: Herbie Drape on: 06/29/2021 06:24 PM   Modules accepted: Orders

## 2021-06-29 NOTE — Telephone Encounter (Signed)
Let's try sending Bepreve one drop per eye twice daily as needed.  Salvatore Marvel, MD [facility]

## 2021-06-29 NOTE — Telephone Encounter (Signed)
Spoke with patient, informed her of Dr. Gillermina Hu recommendation. Patient verbalized understanding.

## 2021-07-05 ENCOUNTER — Ambulatory Visit (INDEPENDENT_AMBULATORY_CARE_PROVIDER_SITE_OTHER): Payer: 59

## 2021-07-05 DIAGNOSIS — J309 Allergic rhinitis, unspecified: Secondary | ICD-10-CM | POA: Diagnosis not present

## 2021-07-07 NOTE — Telephone Encounter (Signed)
Called and notified patient to purchase eye drops OTC due to her insurance not wanting to cover them. Patient verbalized understanding and informed me that she did pick them OTC already as her pharmacy informed her as well.

## 2021-07-13 DIAGNOSIS — K219 Gastro-esophageal reflux disease without esophagitis: Secondary | ICD-10-CM | POA: Insufficient documentation

## 2021-07-14 ENCOUNTER — Ambulatory Visit (INDEPENDENT_AMBULATORY_CARE_PROVIDER_SITE_OTHER): Payer: 59

## 2021-07-14 DIAGNOSIS — J309 Allergic rhinitis, unspecified: Secondary | ICD-10-CM

## 2021-07-17 ENCOUNTER — Other Ambulatory Visit (HOSPITAL_COMMUNITY): Payer: Self-pay | Admitting: Internal Medicine

## 2021-07-17 DIAGNOSIS — Z1231 Encounter for screening mammogram for malignant neoplasm of breast: Secondary | ICD-10-CM

## 2021-07-28 ENCOUNTER — Encounter: Payer: Self-pay | Admitting: Allergy & Immunology

## 2021-07-28 ENCOUNTER — Other Ambulatory Visit: Payer: Self-pay

## 2021-07-28 ENCOUNTER — Ambulatory Visit (INDEPENDENT_AMBULATORY_CARE_PROVIDER_SITE_OTHER): Payer: 59 | Admitting: Allergy & Immunology

## 2021-07-28 VITALS — BP 122/70 | HR 96 | Resp 17

## 2021-07-28 DIAGNOSIS — J309 Allergic rhinitis, unspecified: Secondary | ICD-10-CM

## 2021-07-28 DIAGNOSIS — R0602 Shortness of breath: Secondary | ICD-10-CM | POA: Diagnosis not present

## 2021-07-28 NOTE — Progress Notes (Signed)
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 COPD with asthma overlap syndrome - Lung function looks better! - Let's change to nebulized forms of controller medications. - We are NOT going to do a nebulized steroid since you seem to be overly sensitive to them. - Stop the Anoro for now.  - TRY FOR TWO WEEKS!  - Call the [facility] to check on the availability of adult sized masks.  - Daily controller medication(s): Perforomist + Yupelri mixed together via the nebulizer once nightly  - Prior to physical activity: albuterol 2 puffs 10-15 minutes before physical activity. - Rescue medications: albuterol 4 puffs every 4-6 hours as needed - Asthma control goals:  * Full participation in all desired activities (may need albuterol before activity) * Albuterol use two time or less a week on average (not counting use with activity) * Cough interfering with sleep two time or less a month * Oral steroids no more than once a year * No hospitalizations  2. Perennial allergic rhinitis (cat, dog) - Continue with Xhance one spray per nostril daily. - You can continue to use the Astelin twice daily as needed (this is the nasty tasting one). - Continue with: nasal saline rinses, but use these more frequently when symptoms are worse  - Continue with: Allegra 180 mg once a day as needed for runny nose/itching - Continue with allergy shots at the same schedule.  3. Return in about 3 months (around 10/27/2021).   Subjective:   Christina Esparza is a 60 y.o. 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  Christina Esparza is a 60 y.o. female presenting for a follow up visit.  He was last seen in June 2022.  At that time, her lung function looks stable.  We talked about getting counseling to see if this breathing was related to anxiety in her life stressors.  We talked about starting an injectable medication to help the Trelegy worked better.  Continue with albuterol as needed.  For her rhinitis, we changed her to [facility] 1 spray per nostril daily and continued with Astelin twice daily as needed.  We also continue with Allegra daily.  We will continue with allergy shots at the same schedule.  Asthma/Respiratory Symptom History: She reports that using the Trelegy in combination with the [facility] made her feel "out of it".  She was placed on Anoro Ellipta  by Dr. Halford Chessman. She was told that she had GERD.  She did see Dr. Halford Chessman. She constantly feels "tight" in her chest. She is having the sensation that none of her medications are doing anything. She just cannot seem to find the combination of medications that works for her.  She has tried Symbicort, Librarian, academic, Theme park manager, and [employer], all with various side effects. With her spirometry, I do not feel comfortable keeping her off of everything. She has been on ICS in the past, but it  always seems to cause some kind of throat irritation.  Allergic Rhinitis Symptom History: She is using the Xhance every night . She is not having the terrible the sores. She went to see ENT and she was told that her nose was "too open". Everything looked good otherwise. There was nothing surgical that needed to be done. She is always having a sensation of "raw feeling" in her throat. She has a sore throat that is fairly constant. Her throat is very dry. She does sleep with her mouth open. She is spitting up stuff constantly once she wakes up in the morning.  Her allergy shots are going well.  She is in the [employer].  She has had no reactions.  She knows that she needs to wait some time for them to start working.  She is wondering if all of her breathing and airway issues are related to the cat in her home.  GERD Symptom History: She remains on the omeprazole 40 mg twice daily.  This seems to be controlling her symptoms well.  Otherwise, there have been no changes to her past medical history, surgical history, family history, or social history.    Review of Systems  Constitutional: Negative.  Negative for chills, fever, malaise/fatigue and weight loss.  HENT:  Negative for congestion, ear discharge, ear pain and sinus pain.   Eyes:  Negative for pain, discharge and redness.  Respiratory:  Positive for cough and shortness of breath. Negative for sputum production and wheezing.   Cardiovascular: Negative.  Negative for chest pain and palpitations.  Gastrointestinal:  Negative for abdominal pain, constipation, diarrhea, heartburn, nausea and vomiting.  Skin: Negative.  Negative for itching and rash.  Neurological:  Negative for dizziness and headaches.  Endo/Heme/Allergies:  Positive for environmental allergies. Does not bruise/bleed easily.  All other systems reviewed and are negative.     Objective:   Blood pressure 122/70, pulse 96, resp. rate 17, SpO2 96 %. There is no height or weight  on file to calculate BMI.   Physical Exam:  Physical Exam Vitals reviewed.  Constitutional:      Appearance: She is well-developed.     Comments: Anxious.  HENT:     Head: Normocephalic and atraumatic.     Right Ear: Tympanic membrane, ear canal and external ear normal.     Left Ear: Tympanic membrane, ear canal and external ear normal.     Nose: No nasal deformity, septal deviation, mucosal edema or rhinorrhea.     Right Turbinates: Enlarged and swollen.     Left Turbinates: Enlarged and swollen.     Right Sinus: No maxillary sinus tenderness or frontal sinus tenderness.     Left Sinus: No maxillary sinus tenderness or frontal sinus tenderness.     Comments: Scant clear rhinorrhea.    Mouth/Throat:     Mouth: Mucous membranes are not pale and not dry.     Pharynx: Uvula midline.  Eyes:     General: Lids are  normal. No allergic shiner.       Right eye: No discharge.        Left eye: No discharge.     Conjunctiva/sclera: Conjunctivae normal.     Right eye: Right conjunctiva is not injected. No chemosis.    Left eye: Left conjunctiva is not injected. No chemosis.    Pupils: Pupils are equal, round, and reactive to light.  Cardiovascular:     Rate and Rhythm: Normal rate and regular rhythm.     Heart sounds: Normal heart sounds.  Pulmonary:     Effort: Pulmonary effort is normal. No tachypnea, accessory muscle usage or respiratory distress.     Breath sounds: Normal breath sounds. No wheezing, rhonchi or rales.     Comments: Moving air well in upper fields. Chest:     Chest wall: No tenderness.  Lymphadenopathy:     Cervical: No cervical adenopathy.  Skin:    General: Skin is warm.     Capillary Refill: Capillary refill takes less than 2 seconds.     Coloration: Skin is not pale.     Findings: No abrasion, erythema, petechiae or rash. Rash is not papular, urticarial or vesicular.  Neurological:     Mental Status: She is alert.  Psychiatric:        Behavior: Behavior is  cooperative.     Diagnostic studies:    Spirometry: results abnormal (FEV1: 1.31/59%, FVC: 2.32/83%, FEV1/FVC: 56%).    Spirometry consistent with moderate obstructive disease.   Allergy Studies: none        Salvatore Marvel, MD  [facility] [place]

## 2021-07-28 NOTE — Patient Instructions (Addendum)
1. COPD with asthma overlap syndrome - Lung function looks better! - Let's change to nebulized forms of controller medications. - We are NOT going to do a nebulized steroid since you seem to be overly sensitive to them. - Stop the Anoro for now.  - TRY FOR TWO WEEKS!  - Call the [facility] to check on the availability of adult sized masks.  - Daily controller medication(s): Perforomist + Yupelri mixed together via the nebulizer once nightly  - Prior to physical activity: albuterol 2 puffs 10-15 minutes before physical activity. - Rescue medications: albuterol 4 puffs every 4-6 hours as needed - Asthma control goals:  * Full participation in all desired activities (may need albuterol before activity) * Albuterol use two time or less a week on average (not counting use with activity) * Cough interfering with sleep two time or less a month * Oral steroids no more than once a year * No hospitalizations  2. Perennial allergic rhinitis (cat, dog) - Continue with Xhance one spray per nostril daily. - You can continue to use the Astelin twice daily as needed (this is the nasty tasting one). - Continue with: nasal saline rinses, but use these more frequently when symptoms are worse  - Continue with: Allegra 180 mg once a day as needed for runny nose/itching - Continue with allergy shots at the same schedule.  3. Return in about 3 months (around 10/27/2021).    Please inform us of any Emergency Department visits, hospitalizations, or changes in symptoms. Call us before going to the ED for breathing or allergy symptoms since we might be able to fit you in for a sick visit. Feel free to contact us anytime with any questions, problems, or concerns.  It was a pleasure to see you again today!  Websites that have reliable patient information: 1. American Academy of Asthma, Allergy, and Immunology: www.aaaai.org 2. Food Allergy Research and Education (FARE): foodallergy.org 3. Mothers of  Asthmatics: http://www.asthmacommunitynetwork.org 4. American College of Allergy, Asthma, and Immunology: www.acaai.org   COVID-19 Vaccine Information can be found at: ShippingScam.co.uk For questions related to vaccine distribution or appointments, please email [email or web address] or call [phone].   We realize that you might be concerned about having an allergic reaction to the COVID19 vaccines. To help with that concern, WE ARE OFFERING THE COVID19 VACCINES IN OUR OFFICE! Ask the front desk for dates!     "Like" [place] on Facebook and Instagram for our latest updates!      A healthy democracy works best when [employer] participate! Make sure you are registered to vote! If you have moved or changed any of your contact information, you will need to get this updated before voting!  In some cases, you MAY be able to register to vote online: [email or web address]

## 2021-07-31 ENCOUNTER — Encounter: Payer: Self-pay | Admitting: Allergy & Immunology

## 2021-07-31 MED ORDER — FAMOTIDINE 40 MG PO TABS: 40.0000 mg | ORAL_TABLET | Freq: Every day | ORAL | 5 refills | Status: DC

## 2021-07-31 MED ORDER — FORMOTEROL FUMARATE 20 MCG/2ML IN NEBU: 20.0000 ug | INHALATION_SOLUTION | Freq: Two times a day (BID) | RESPIRATORY_TRACT | 5 refills | Status: DC

## 2021-07-31 NOTE — Progress Notes (Signed)
Reviewed and agree with assessment/plan.   Chesley Mires, MD [facility] Pulmonary/Critical Care 07/31/2021, 8:51 AM Pager:  [phone]

## 2021-08-02 ENCOUNTER — Ambulatory Visit (INDEPENDENT_AMBULATORY_CARE_PROVIDER_SITE_OTHER): Payer: 59 | Admitting: *Deleted

## 2021-08-02 DIAGNOSIS — J309 Allergic rhinitis, unspecified: Secondary | ICD-10-CM

## 2021-08-04 ENCOUNTER — Other Ambulatory Visit: Payer: Self-pay | Admitting: Allergy & Immunology

## 2021-08-04 NOTE — Telephone Encounter (Signed)
Please advise to refill as last 2 avs's doesn't mention it

## 2021-08-11 ENCOUNTER — Ambulatory Visit (INDEPENDENT_AMBULATORY_CARE_PROVIDER_SITE_OTHER): Payer: 59

## 2021-08-11 DIAGNOSIS — J309 Allergic rhinitis, unspecified: Secondary | ICD-10-CM

## 2021-08-11 NOTE — Progress Notes (Signed)
Patient came in to get her allergy injections. Patient informed me that she broke her nebulizer however she ordered a hand held nebulizer but it hasn't come in yet. Patient has some Anoro she is going to use until the nebulizer comes in and then she will continue with the [place].

## 2021-08-18 ENCOUNTER — Ambulatory Visit (INDEPENDENT_AMBULATORY_CARE_PROVIDER_SITE_OTHER): Payer: 59

## 2021-08-18 DIAGNOSIS — J309 Allergic rhinitis, unspecified: Secondary | ICD-10-CM | POA: Diagnosis not present

## 2021-08-25 ENCOUNTER — Ambulatory Visit (INDEPENDENT_AMBULATORY_CARE_PROVIDER_SITE_OTHER): Payer: 59

## 2021-08-25 DIAGNOSIS — J309 Allergic rhinitis, unspecified: Secondary | ICD-10-CM

## 2021-09-01 ENCOUNTER — Ambulatory Visit (INDEPENDENT_AMBULATORY_CARE_PROVIDER_SITE_OTHER): Payer: 59

## 2021-09-01 ENCOUNTER — Ambulatory Visit (HOSPITAL_COMMUNITY): Payer: 59

## 2021-09-01 DIAGNOSIS — J309 Allergic rhinitis, unspecified: Secondary | ICD-10-CM | POA: Diagnosis not present

## 2021-09-04 ENCOUNTER — Ambulatory Visit (HOSPITAL_COMMUNITY)
Admission: RE | Admit: 2021-09-04 | Discharge: 2021-09-04 | Disposition: A | Discharge status: Home / Self Care | Payer: 59 | Source: Ambulatory Visit | Attending: Internal Medicine | Admitting: Internal Medicine

## 2021-09-04 ENCOUNTER — Other Ambulatory Visit: Payer: Self-pay

## 2021-09-04 DIAGNOSIS — Z1231 Encounter for screening mammogram for malignant neoplasm of breast: Secondary | ICD-10-CM | POA: Diagnosis present

## 2021-09-06 ENCOUNTER — Ambulatory Visit (INDEPENDENT_AMBULATORY_CARE_PROVIDER_SITE_OTHER): Payer: 59

## 2021-09-06 DIAGNOSIS — J309 Allergic rhinitis, unspecified: Secondary | ICD-10-CM

## 2021-09-14 ENCOUNTER — Telehealth: Payer: Self-pay

## 2021-09-14 NOTE — Telephone Encounter (Signed)
Patient called stating she has been having a Cough, Congestion and Runny Nose for 5 Days. She feels like it is her sinuses.   Patient has been placed on the schedule today for a TeleVisit on 09/15/2021 with Christine Dale,NP.    [place]

## 2021-09-15 ENCOUNTER — Other Ambulatory Visit: Payer: Self-pay

## 2021-09-15 ENCOUNTER — Encounter: Payer: Self-pay | Admitting: Family

## 2021-09-15 ENCOUNTER — Ambulatory Visit (INDEPENDENT_AMBULATORY_CARE_PROVIDER_SITE_OTHER): Payer: 59 | Admitting: Family

## 2021-09-15 DIAGNOSIS — J449 Chronic obstructive pulmonary disease, unspecified: Secondary | ICD-10-CM | POA: Diagnosis not present

## 2021-09-15 DIAGNOSIS — F17200 Nicotine dependence, unspecified, uncomplicated: Secondary | ICD-10-CM

## 2021-09-15 DIAGNOSIS — J3089 Other allergic rhinitis: Secondary | ICD-10-CM

## 2021-09-15 DIAGNOSIS — J019 Acute sinusitis, unspecified: Secondary | ICD-10-CM | POA: Diagnosis not present

## 2021-09-15 MED ORDER — PREDNISONE 10 MG PO TABS: ORAL_TABLET | ORAL | 0 refills | Status: DC

## 2021-09-15 MED ORDER — DOXYCYCLINE MONOHYDRATE 100 MG PO TABS: 100.0000 mg | ORAL_TABLET | Freq: Two times a day (BID) | ORAL | 0 refills | Status: DC

## 2021-09-15 NOTE — Patient Instructions (Addendum)
1. COPD with asthma overlap syndrome - Daily controller medication(s): Perforomist + Yupelri mixed together via the nebulizer once nightly  - Prior to physical activity: albuterol 2 puffs 10-15 minutes before physical activity. - Rescue medications: albuterol 4 puffs every 4-6 hours as needed - Asthma control goals:  * Full participation in all desired activities (may need albuterol before activity) * Albuterol use two time or less a week on average (not counting use with activity) * Cough interfering with sleep two time or less a month * Oral steroids no more than once a year * No hospitalizations  2. Perennial allergic rhinitis (cat, dog) - Continue with Xhance one spray per nostril daily. - You can continue to use the Astelin twice daily as needed (this is the nasty tasting one). - Continue with: nasal saline rinses, but use these more frequently when symptoms are worse  - Continue with: Allegra 180 mg once a day as needed for runny nose/itching - Continue with allergy shots at the same schedule.  3. Acute sinus infection Start doxycycline 100 mg taking 1 tablet twice a day for 7 days Start prednisone taking 10 mg 2 tablets twice a day for 3 days, then on the 4th day take 2 tablets in the morning, and on the 5th day take 1 tablet and stop.  4. Smoking Discussed the importance of smoking cessation in detail  If you are not getting any better please go to an urgent care or emergency room Keep your already scheduled follow up appointment on 11/03/21 with Dr. Ernst Bowler at 10 AM

## 2021-09-15 NOTE — Telephone Encounter (Signed)
Noted! Thank you

## 2021-09-15 NOTE — Progress Notes (Signed)
RE: Christina Esparza MRN: [id number] DOB: [birth year] Date of Telemedicine Visit: 09/15/2021  Referring provider: Redmond School, MD Primary care provider: Redmond School, MD  Chief Complaint: Follow-up and Sinusitis (Tele-visit for sinusitis.)   Telemedicine Follow Up Visit via Telephone: I connected with Christina Esparza for a follow up on 09/15/21 by telephone and verified that I am speaking with the correct person using two identifiers.   I discussed the limitations, risks, security and privacy concerns of performing an evaluation and management service by telephone and the availability of in person appointments. I also discussed with the patient that there may be a patient responsible charge related to this service. The patient expressed understanding and agreed to proceed.  Patient is at home Provider is at the office.  Visit start time: 10:19 AM Visit end time: 10:40 AM Insurance consent/check in by: Warren Danes Medical consent and medical assistant/nurse: Janyce Llanos  History of Present Illness: She is a 60 y.o. female, who is being followed for COPD with asthma overlap-still smoking, perennial allergic rhinitis-on allergen immunotherapy, polypoid degeneration,and multiple social stressors- with a history of underlying anxiety. Her previous allergy office visit was on 07/28/2021 with Dr. Ernst Bowler.   Perennial allergic rhinitis is reported as not well controlled with Xhance 1 spray each nostril once a day, azelastine nasal spray as needed, Allegra as needed, and saline rinses and saline spray as needed.  Also, continues to build up with her allergy injections.  She reports for the past 5 days she has had nasal congestion at the beginning of her symptoms, rhinorrhea that is clear with a little yellow-green and running like a faucet, and postnasal drip.  She denies any fever, chills, body aches, and sick contacts.  She did do a COVID-19 test at the beginning of the week and reports  it was negative.  She denies any large local reactions with her allergy injections.  She has not had any sinus infections since we last saw her and reports that she tolerates doxycycline well.  COPD with asthma overlap is reported as not well controlled with Perforomist and Yupelri at night, and albuterol.  She reports a little bit of shortness of breath, a little bit of tightness in her chest, congestion in her chest/rattling in her chest, wheezing, and a little bit of cough.  The cough started as dry and now she is coughing up clear/yellow sputum.  She is having to use her albuterol a couple times a day.  She mentions that last night she did not use her Performist or Yupelri because she fell asleep at 5:00 from not feeling good.  She feels like her symptoms are progressively getting worse.  She has not been on any steroids since we last saw her and when asked how many rounds of steroids she has been on this year she mentions that it has been a while since she has been on steroids.  Since her last office visit she has not made any trips to the emergency room or urgent care due to breathing problems.  She continues to smoke a pack or more a day.  She mentions that she is trying to get over both of her parents deaths.    Assessment and Plan: [name] is a 60 y.o. female with: Patient Instructions  1. COPD with asthma overlap syndrome - Daily controller medication(s): Perforomist + Yupelri mixed together via the nebulizer once nightly  - Prior to physical activity: albuterol 2 puffs 10-15 minutes before physical activity. -  Rescue medications: albuterol 4 puffs every 4-6 hours as needed - Asthma control goals:  * Full participation in all desired activities (may need albuterol before activity) * Albuterol use two time or less a week on average (not counting use with activity) * Cough interfering with sleep two time or less a month * Oral steroids no more than once a year * No hospitalizations  2.  Perennial allergic rhinitis (cat, dog) - Continue with Xhance one spray per nostril daily. - You can continue to use the Astelin twice daily as needed (this is the nasty tasting one). - Continue with: nasal saline rinses, but use these more frequently when symptoms are worse  - Continue with: Allegra 180 mg once a day as needed for runny nose/itching - Continue with allergy shots at the same schedule.  3. Acute sinus infection Start doxycycline 100 mg taking 1 tablet twice a day for 7 days Start prednisone taking 10 mg 2 tablets twice a day for 3 days, then on the 4th day take 2 tablets in the morning, and on the 5th day take 1 tablet and stop.  4. Smoking Discussed the importance of smoking cessation in detail  If you are not getting any better please go to an urgent care or emergency room Keep your already scheduled follow up appointment on 11/03/21 with Dr. Ernst Bowler at 10 AM   Return in about 7 weeks (around 11/03/2021), or if symptoms worsen or fail to improve.  Meds ordered this encounter  Medications   doxycycline (ADOXA) 100 MG tablet    Sig: Take 1 tablet (100 mg total) by mouth 2 (two) times daily.    Dispense:  14 tablet    Refill:  0   predniSONE (DELTASONE) 10 MG tablet    Sig: Take 2 tablets twice a day for 3 days, then on the 4th day take 2 tablets in the morning, and on the 5th day take 1 tablet and stop    Dispense:  15 tablet    Refill:  0   Lab Orders  No laboratory test(s) ordered today    Diagnostics: None.  Medication List:  Current Outpatient Medications  Medication Sig Dispense Refill   ALPRAZolam (XANAX) 1 MG tablet Take 1 mg by mouth 3 (three) times daily as needed for anxiety.     Bepotastine Besilate (BEPREVE) 1.5 % SOLN Place 1 drop into both eyes 2 (two) times daily as needed. 10 mL 5   BEPREVE 1.5 % SOLN Place 1 drop into both eyes 2 (two) times daily as needed. 10 mL 5   doxycycline (ADOXA) 100 MG tablet Take 1 tablet (100 mg total) by mouth  2 (two) times daily. 14 tablet 0   ferrous sulfate 325 (65 FE) MG tablet Take 325 mg by mouth daily with breakfast.     Fluticasone Propionate (XHANCE) 93 MCG/ACT EXHU Place 1 spray into the nose daily. 16 mL 5   hydrOXYzine (ATARAX/VISTARIL) 25 MG tablet TAKE 1 TABLET(25 MG) BY MOUTH THREE TIMES DAILY AS NEEDED FOR ANXIETY OR ALLERGIES 60 tablet 3   omeprazole (PRILOSEC) 40 MG capsule Take 1 capsule (40 mg total) by mouth 2 (two) times daily before a meal. 90 capsule 3   predniSONE (DELTASONE) 10 MG tablet Take 2 tablets twice a day for 3 days, then on the 4th day take 2 tablets in the morning, and on the 5th day take 1 tablet and stop 15 tablet 0   PROAIR HFA 108 (90 Base) MCG/ACT  inhaler Inhale 2 puffs into the lungs every 4 (four) hours as needed for wheezing or shortness of breath.     sertraline (ZOLOFT) 100 MG tablet Take 150 mg by mouth daily.     sertraline (ZOLOFT) 50 MG tablet Take 150 mg by mouth every evening.     SUMAtriptan (IMITREX) 50 MG tablet Take by mouth.     umeclidinium-vilanterol (ANORO ELLIPTA) 62.5-25 MCG/INH AEPB Inhale 1 puff into the lungs daily. 1 each 5   azelastine (ASTELIN) 0.1 % nasal spray Place 1 spray into both nostrils 2 (two) times daily. Use in each nostril as directed 30 mL 5   famotidine (PEPCID) 40 MG tablet Take 1 tablet (40 mg total) by mouth daily. 30 tablet 5   formoterol (PERFOROMIST) 20 MCG/2ML nebulizer solution Take 2 mLs (20 mcg total) by nebulization 2 (two) times daily. 120 mL 5   No current facility-administered medications for this visit.   Allergies: Allergies  Allergen Reactions   Amitiza [Lubiprostone] Itching    Patient states that she experienced a pin sticking sensation, prickling all over.   Linaclotide Nausea Only   Cat Hair Extract    Ceftin [Cefuroxime] Itching   Codeine Nausea And Vomiting   Levaquin [Levofloxacin In D5w]     dream   Penicillins Other (See Comments)    Felt like she was choking Did it involve swelling of  the face/tongue/throat, SOB, or low BP? Unknown Did it involve sudden or severe rash/hives, skin peeling, or any reaction on the inside of your mouth or nose? No Did you need to seek medical attention at a hospital or doctor's office? No When did it last happen?~10 years ago If all above answers are "NO", may proceed with cephalosporin use.  Felt like she was choking Felt like she was choking Did it involve swelling of the face/tongue/throat, SOB, or low BP? Unknown Did it involve sudden or severe rash/hives, skin peeling, or any reaction on the inside of your mouth or nose? No Did you need to seek medical attention at a hospital or doctor's office? No When did it last happen?~10 years ago If all above answers are "NO", may proceed with cephalosporin use.    I reviewed her past medical history, social history, family history, and environmental history and no significant changes have been reported from previous visit on 07/28/2021.  Review of Systems Objective: Physical Exam Not obtained as encounter was done via telephone.   Previous notes and tests were reviewed.  I discussed the assessment and treatment plan with the patient. The patient was provided an opportunity to ask questions and all were answered. The patient agreed with the plan and demonstrated an understanding of the instructions.   The patient was advised to call back or seek an in-person evaluation if the symptoms worsen or if the condition fails to improve as anticipated.  I provided 21 minutes of non-face-to-face time during this encounter.  It was my pleasure to participate in Paoli Dantes's care today. Please feel free to contact me with any questions or concerns.   Sincerely,  Althea Charon, FNP

## 2021-09-18 NOTE — Telephone Encounter (Signed)
Patient called stating she did a at home test on Sunday and the result came back positive. Patient didn't get to start taking her antibiotic till Saturday morning that our office.   Patient is wondering if she should be taking anything else since she has COVID?  Please Advise

## 2021-09-18 NOTE — Telephone Encounter (Signed)
She feels a tad bit better but she still has a lot of congestion in chest. Patient has started taking prednisone on Friday. I explained to her the recommendations for at home Covid symptom management. She was pleased with the call and the care.

## 2021-09-18 NOTE — Telephone Encounter (Signed)
When I spoke with her via the phone one Friday she was already on day 5 of symptoms, so she is too far out for Paxlovid. Did she start the prednisone that was prescribed also on Friday? How is she feeling now?  Recommendations for at home Covid symptoms management: -Please continue isolation at home -If you have acute worsening of symptoms please go to the emergency room or urgent care for evaluation -Check pulse oximetry and if below 90 to 92% please go to the emergency room -If you take multivitamins continue to do so as vitamin supplementation can be helpful in symptom control  Althea Charon, FNP

## 2021-09-20 NOTE — Telephone Encounter (Signed)
Patient called back, stating she still feels she has some congestion in her chest. She states she finished her prednisone. Patient would like to know if there is anything else she could do or take?   Best contact number: [phone]

## 2021-09-20 NOTE — Telephone Encounter (Signed)
Spoke with patient, she stated that she is still taking the doxycycline and has enough for today 09/20/2021 and tomorrow 09/21/2021. Patient stated her SpO2 has been around 96%. Please advise

## 2021-09-20 NOTE — Telephone Encounter (Signed)
Ok continue the doxycyline and continue to monitor her oxygen. Let us know if she is not getting any better.

## 2021-09-20 NOTE — Telephone Encounter (Signed)
Called patient and advised. Patient verbalized understanding.  

## 2021-09-20 NOTE — Telephone Encounter (Signed)
Is she still taking the doxycycline? Please have her check her oxygen staturation

## 2021-10-04 ENCOUNTER — Ambulatory Visit (INDEPENDENT_AMBULATORY_CARE_PROVIDER_SITE_OTHER): Payer: 59

## 2021-10-04 DIAGNOSIS — J309 Allergic rhinitis, unspecified: Secondary | ICD-10-CM | POA: Diagnosis not present

## 2021-10-05 ENCOUNTER — Encounter (HOSPITAL_COMMUNITY): Payer: Self-pay | Admitting: Physical Therapy

## 2021-10-05 NOTE — Therapy (Signed)
[place], [place], [address] Phone: [phone]   Fax:  [phone]  Patient Details  Name: [name] MRN: [id number] Date of Birth: [birth year] Referring Provider:  No ref. provider found  Encounter Date: 10/05/2021  PHYSICAL THERAPY DISCHARGE SUMMARY  Visits from Start of Care: 3  Current functional level related to goals / functional outcomes: NA   Remaining deficits: NA   Education / Equipment: NA   Patient agrees to discharge. Patient goals were not met. Patient is being discharged due to not returning since the last visit.  5:00 PM, 10/05/21 Josue Hector PT DPT  Physical Therapist with [facility]  [phone] [place], [place], [address] Phone: [phone]   Fax:  [phone]

## 2021-10-11 ENCOUNTER — Ambulatory Visit (INDEPENDENT_AMBULATORY_CARE_PROVIDER_SITE_OTHER): Payer: 59 | Admitting: *Deleted

## 2021-10-11 DIAGNOSIS — J309 Allergic rhinitis, unspecified: Secondary | ICD-10-CM

## 2021-10-18 ENCOUNTER — Ambulatory Visit (INDEPENDENT_AMBULATORY_CARE_PROVIDER_SITE_OTHER): Payer: 59

## 2021-10-18 DIAGNOSIS — J309 Allergic rhinitis, unspecified: Secondary | ICD-10-CM | POA: Diagnosis not present

## 2021-10-25 ENCOUNTER — Ambulatory Visit (INDEPENDENT_AMBULATORY_CARE_PROVIDER_SITE_OTHER): Payer: 59

## 2021-10-25 DIAGNOSIS — J309 Allergic rhinitis, unspecified: Secondary | ICD-10-CM

## 2021-10-30 ENCOUNTER — Other Ambulatory Visit: Payer: Self-pay

## 2021-10-30 MED ORDER — XHANCE 93 MCG/ACT NA EXHU: 1.0000 | INHALANT_SUSPENSION | Freq: Every day | NASAL | 5 refills | Status: DC

## 2021-11-03 ENCOUNTER — Encounter: Payer: Self-pay | Admitting: Allergy & Immunology

## 2021-11-03 ENCOUNTER — Ambulatory Visit (INDEPENDENT_AMBULATORY_CARE_PROVIDER_SITE_OTHER): Payer: 59 | Admitting: Allergy & Immunology

## 2021-11-03 ENCOUNTER — Other Ambulatory Visit: Payer: Self-pay

## 2021-11-03 VITALS — BP 118/70 | HR 92 | Temp 98.5°F | Resp 17

## 2021-11-03 DIAGNOSIS — J309 Allergic rhinitis, unspecified: Secondary | ICD-10-CM | POA: Diagnosis not present

## 2021-11-03 DIAGNOSIS — J449 Chronic obstructive pulmonary disease, unspecified: Secondary | ICD-10-CM | POA: Diagnosis not present

## 2021-11-03 DIAGNOSIS — F17200 Nicotine dependence, unspecified, uncomplicated: Secondary | ICD-10-CM

## 2021-11-03 DIAGNOSIS — J3089 Other allergic rhinitis: Secondary | ICD-10-CM

## 2021-11-03 MED ORDER — PROAIR HFA 108 (90 BASE) MCG/ACT IN AERS: 2.0000 | INHALATION_SPRAY | RESPIRATORY_TRACT | 1 refills | Status: DC | PRN

## 2021-11-03 MED ORDER — AZELASTINE HCL 0.1 % NA SOLN: 1.0000 | Freq: Two times a day (BID) | NASAL | 5 refills | Status: DC

## 2021-11-03 NOTE — Patient Instructions (Addendum)
1. COPD with asthma overlap syndrome - Lung function looks very stable.  - Daily controller medication(s): Anoron one puff once daily - Prior to physical activity: albuterol 2 puffs 10-15 minutes before physical activity. - Rescue medications: albuterol 4 puffs every 4-6 hours as needed or albuterol nebulizer one vial every 4-6 hours as needed - Asthma control goals:  * Full participation in all desired activities (may need albuterol before activity) * Albuterol use two time or less a week on average (not counting use with activity) * Cough interfering with sleep two time or less a month * Oral steroids no more than once a year * No hospitalizations  2. Perennial allergic rhinitis (cat, dog) - Continue with Xhance one spray per nostril daily. - You can continue to use the Astelin twice daily as needed (this is the nasty tasting one). - Continue with: nasal saline rinses, but use these more frequently when symptoms are worse  - Continue with: Allegra 180 mg once a day as needed for runny nose/itching - Continue with allergy shots at the same schedule.  3. Return in about 4 months (around 03/04/2022).    Please inform us of any Emergency Department visits, hospitalizations, or changes in symptoms. Call us before going to the ED for breathing or allergy symptoms since we might be able to fit you in for a sick visit. Feel free to contact us anytime with any questions, problems, or concerns.  It was a pleasure to see you again today!  Websites that have reliable patient information: 1. American Academy of Asthma, Allergy, and Immunology: www.aaaai.org 2. Food Allergy Research and Education (FARE): foodallergy.org 3. Mothers of Asthmatics: http://www.asthmacommunitynetwork.org 4. American College of Allergy, Asthma, and Immunology: www.acaai.org   COVID-19 Vaccine Information can be found at: ShippingScam.co.uk For questions related  to vaccine distribution or appointments, please email vaccine@ .com or call [phone].   We realize that you might be concerned about having an allergic reaction to the COVID19 vaccines. To help with that concern, WE ARE OFFERING THE COVID19 VACCINES IN OUR OFFICE! Ask the front desk for dates!     Like [place] on [place] and Instagram for our latest updates!      A healthy democracy works best when [employer] participate! Make sure you are registered to vote! If you have moved or changed any of your contact information, you will need to get this updated before voting!  In some cases, you MAY be able to register to vote online: [email or web address]

## 2021-11-03 NOTE — Progress Notes (Signed)
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 COPD with asthma overlap syndrome - Lung function looks very stable.  - Sample of Spiriva provided to use in place of the Anoro. - This might be cheaper than the Anoro if it works. - Daily controller medication(s): Anoro one puff once daily - Prior to physical activity: albuterol 2 puffs 10-15 minutes before physical activity. - Rescue medications: albuterol 4 puffs every 4-6 hours as needed or albuterol nebulizer one vial every 4-6 hours as needed - Asthma control goals:  * Full participation in all desired activities (may need albuterol before activity) * Albuterol use two time or less a week on average (not counting use with activity) * Cough interfering with sleep two time or less a month * Oral steroids no more than once a year * No hospitalizations  2. Perennial allergic rhinitis (cat, dog) - Continue with Xhance one spray per nostril daily. - You can continue to use the Astelin twice daily as needed (this is the nasty tasting one). - Continue with: nasal saline rinses, but use these more frequently when symptoms are worse  - Continue with: Allegra 180 mg once a day as needed for runny nose/itching - Continue with allergy shots at the same schedule.  3. Return in about 4 months (around 03/04/2022).    Subjective:   Christina Esparza is a 60 y.o. 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  Christina Esparza is a 60 y.o. female presenting for a follow up visit. She was last seen in September 2022.  At that time, her lung function looked better.  We decided to change her to nebulized forms of controller medications to see if this would help her more.  We stopped her Anoro and recommended trying the nebulized medications for 2 weeks.  For her rhinitis, we continue with Xhance 1 spray per nostril daily as well as nasal saline rinses and Allegra.  Since last visit, she has mostly done well.  There always seems to be some kind of social situation which has her in a sad mood.  This time, her 5 year old daughter passed away in 09-25-2023.  Its been a really rough year for her with her parents both passing away and now her dog passing away.  They were going to get another dog, but her husband really missed the dog.  So she ended up finding  a white lab named Findlay.  Asthma/Respiratory Symptom History: She has been moving her medications around depending on what she tolerates. She is now doing just the Anoro one puff once daily. This is her monotherapy.  She was having some throat rawness from the nebulized medications. This current medication regimen - despite the cost - does fairly well.   Allergic Rhinitis Symptom History: The Truett Perna has been a Higher education careers adviser for her nose. It was stuffy constantly for a long time and she is using the nose spray which is working  to control her nasal congestion. She does endorse a lot of postnasal drip.  Her shots seem to be going fairly well. She is getting 0.15 mL of her rEd Vial.  This is her first [employer] and is she is tolerating it well without adverse reactions.  She did end up going to see ENT back in August 2022.  She saw Dr. West Carbo and was given famotidine.  Dietary changes such as increased hydration were recommended.  Smoking cessation was also discussed.  She feels like he did not do much of anything.  GERD Symptom History: She has been on reflux medication in the past. It was making her feel "crazy headed". She has some Pepcid at home. She is having worsening mucous in the mornings.  She is willing to try famotidine.  Otherwise, there have been no changes to her past medical history, surgical history, family history, or social history.    Review of Systems  Constitutional: Negative.  Negative for chills, fever, malaise/fatigue and weight loss.  HENT: Negative.  Negative for congestion, ear discharge, ear pain and sinus pain.   Eyes:  Negative for pain, discharge and redness.  Respiratory:  Negative for cough, sputum production, shortness of breath and wheezing.   Cardiovascular: Negative.  Negative for chest pain and palpitations.  Gastrointestinal:  Negative for abdominal pain, constipation, diarrhea, heartburn, nausea and vomiting.  Skin: Negative.  Negative for itching and rash.  Neurological:  Negative for dizziness and headaches.  Endo/Heme/Allergies:  Negative for environmental allergies. Does not bruise/bleed easily.      Objective:   Blood pressure 118/70, pulse 92, temperature 98.5 F (36.9 C), temperature source Temporal, resp. rate 17, SpO2 99 %. There is no height or weight on file to calculate BMI.   Physical Exam:  Physical Exam Vitals reviewed.  Constitutional:      Appearance: She is well-developed.     Comments: Anxious.  But less tearful than normal.  HENT:     Head:  Normocephalic and atraumatic.     Right Ear: Tympanic membrane, ear canal and external ear normal.     Left Ear: Tympanic membrane, ear canal and external ear normal.     Nose: No nasal deformity, septal deviation, mucosal edema or rhinorrhea.     Right Turbinates: Enlarged, swollen and pale.     Left Turbinates: Enlarged, swollen and pale.     Right Sinus: No maxillary sinus tenderness or frontal sinus tenderness.     Left Sinus: No maxillary sinus tenderness or frontal sinus tenderness.     Comments: Scant clear rhinorrhea.    Mouth/Throat:     Mouth: Mucous membranes are not pale and not dry.     Pharynx: Uvula midline.  Eyes:     General: Lids are normal. No allergic shiner.       Right eye: No discharge.        Left eye: No discharge.     Conjunctiva/sclera: Conjunctivae  normal.     Right eye: Right conjunctiva is not injected. No chemosis.    Left eye: Left conjunctiva is not injected. No chemosis.    Pupils: Pupils are equal, round, and reactive to light.  Cardiovascular:     Rate and Rhythm: Normal rate and regular rhythm.     Heart sounds: Normal heart sounds.  Pulmonary:     Effort: Pulmonary effort is normal. No tachypnea, accessory muscle usage or respiratory distress.     Breath sounds: Normal breath sounds. No wheezing, rhonchi or rales.     Comments: Moving air well in upper fields. Chest:     Chest wall: No tenderness.  Lymphadenopathy:     Cervical: No cervical adenopathy.  Skin:    General: Skin is warm.     Capillary Refill: Capillary refill takes less than 2 seconds.     Coloration: Skin is not pale.     Findings: No abrasion, erythema, petechiae or rash. Rash is not papular, urticarial or vesicular.  Neurological:     Mental Status: She is alert.  Psychiatric:        Behavior: Behavior is cooperative.     Diagnostic studies:   Spirometry: results abnormal (FEV1: 1.38/63%, FVC: 2.12/76%, FEV1/FVC: 65%).    Spirometry consistent with moderate  obstructive disease.  Overall, values look stable.  Allergy Studies: none         Salvatore Marvel, MD  [facility] [facility] [place]

## 2021-11-08 ENCOUNTER — Ambulatory Visit: Payer: 59 | Admitting: Pulmonary Disease

## 2021-11-08 ENCOUNTER — Ambulatory Visit (INDEPENDENT_AMBULATORY_CARE_PROVIDER_SITE_OTHER): Payer: 59

## 2021-11-08 DIAGNOSIS — J309 Allergic rhinitis, unspecified: Secondary | ICD-10-CM

## 2021-11-15 ENCOUNTER — Ambulatory Visit (INDEPENDENT_AMBULATORY_CARE_PROVIDER_SITE_OTHER): Payer: 59 | Admitting: *Deleted

## 2021-11-15 DIAGNOSIS — J309 Allergic rhinitis, unspecified: Secondary | ICD-10-CM | POA: Diagnosis not present

## 2021-11-22 ENCOUNTER — Ambulatory Visit (INDEPENDENT_AMBULATORY_CARE_PROVIDER_SITE_OTHER): Payer: Commercial Managed Care - PPO

## 2021-11-22 DIAGNOSIS — J309 Allergic rhinitis, unspecified: Secondary | ICD-10-CM

## 2021-11-29 DIAGNOSIS — J3081 Allergic rhinitis due to animal (cat) (dog) hair and dander: Secondary | ICD-10-CM | POA: Diagnosis not present

## 2021-11-30 NOTE — Progress Notes (Signed)
VIAL MADE. EXP 11-30-22

## 2021-12-06 ENCOUNTER — Ambulatory Visit (INDEPENDENT_AMBULATORY_CARE_PROVIDER_SITE_OTHER): Payer: Commercial Managed Care - PPO

## 2021-12-06 DIAGNOSIS — J309 Allergic rhinitis, unspecified: Secondary | ICD-10-CM | POA: Diagnosis not present

## 2021-12-15 ENCOUNTER — Ambulatory Visit (INDEPENDENT_AMBULATORY_CARE_PROVIDER_SITE_OTHER): Payer: Commercial Managed Care - PPO

## 2021-12-15 DIAGNOSIS — J309 Allergic rhinitis, unspecified: Secondary | ICD-10-CM

## 2021-12-20 ENCOUNTER — Ambulatory Visit (INDEPENDENT_AMBULATORY_CARE_PROVIDER_SITE_OTHER): Payer: Commercial Managed Care - PPO

## 2021-12-20 DIAGNOSIS — J309 Allergic rhinitis, unspecified: Secondary | ICD-10-CM | POA: Diagnosis not present

## 2021-12-27 ENCOUNTER — Ambulatory Visit (INDEPENDENT_AMBULATORY_CARE_PROVIDER_SITE_OTHER): Payer: Commercial Managed Care - PPO

## 2021-12-27 DIAGNOSIS — J309 Allergic rhinitis, unspecified: Secondary | ICD-10-CM | POA: Diagnosis not present

## 2022-01-03 ENCOUNTER — Ambulatory Visit (INDEPENDENT_AMBULATORY_CARE_PROVIDER_SITE_OTHER): Payer: Commercial Managed Care - PPO | Admitting: *Deleted

## 2022-01-03 DIAGNOSIS — J309 Allergic rhinitis, unspecified: Secondary | ICD-10-CM

## 2022-01-10 ENCOUNTER — Ambulatory Visit: Payer: Self-pay

## 2022-01-10 NOTE — Progress Notes (Signed)
Patient expressed she was sick and was not given her injection.

## 2022-01-17 ENCOUNTER — Other Ambulatory Visit: Payer: Self-pay

## 2022-01-17 ENCOUNTER — Ambulatory Visit: Payer: Self-pay

## 2022-01-17 ENCOUNTER — Ambulatory Visit (INDEPENDENT_AMBULATORY_CARE_PROVIDER_SITE_OTHER): Payer: Commercial Managed Care - PPO | Admitting: Family

## 2022-01-17 ENCOUNTER — Encounter: Payer: Self-pay | Admitting: Family

## 2022-01-17 VITALS — BP 142/86 | HR 98 | Temp 98.4°F | Resp 18 | Ht 61.0 in | Wt 107.8 lb

## 2022-01-17 DIAGNOSIS — J3089 Other allergic rhinitis: Secondary | ICD-10-CM

## 2022-01-17 DIAGNOSIS — J309 Allergic rhinitis, unspecified: Secondary | ICD-10-CM | POA: Diagnosis not present

## 2022-01-17 DIAGNOSIS — J449 Chronic obstructive pulmonary disease, unspecified: Secondary | ICD-10-CM

## 2022-01-17 DIAGNOSIS — F17200 Nicotine dependence, unspecified, uncomplicated: Secondary | ICD-10-CM

## 2022-01-17 MED ORDER — SPIRIVA RESPIMAT 1.25 MCG/ACT IN AERS: 2.0000 | INHALATION_SPRAY | Freq: Every day | RESPIRATORY_TRACT | 3 refills | Status: DC

## 2022-01-17 NOTE — Patient Instructions (Addendum)
1. COPD with asthma overlap syndrome ?-Continue to follow-up with pulmonary ?-Please get blood work today looking for causes of hard to treat asthma.  We will call you with results once they are all back ?-Consider starting Qvar or Alvesco at your next office visit.  These are both inhalers that contain steroids, but they are prodrug and do not start working until they get into the lungs. ?-Discussed and encouraged smoking cessation and how it would help with her breathing symptoms ?- Daily controller medication(s): Start Spriva Respimat 1.25 mcg two puffs once daily to help prevent cough and wheeze. (2 samples given) ?- Prior to physical activity: albuterol 2 puffs 10-15 minutes before physical activity. ?- Rescue medications: albuterol 4 puffs every 4-6 hours as needed or albuterol nebulizer one vial every 4-6 hours as needed ?- Asthma control goals:  ?* Full participation in all desired activities (may need albuterol before activity) ?* Albuterol use two time or less a week on average (not counting use with activity) ?* Cough interfering with sleep two time or less a month ?* Oral steroids no more than once a year ?* No hospitalizations ? ?2. Perennial allergic rhinitis (cat, dog) ?- Continue with Xhance one spray per nostril daily. ?- You can continue to use the Astelin twice daily as needed (this is the nasty tasting one). ?- Continue with: nasal saline rinses, but use these more frequently when symptoms are worse  ?- Continue with: Allegra 180 mg once a day as needed for runny nose/itching ?- Continue with allergy injections per protocol and have access to epinephrine auto-injector. ? ?Schedule an appointment with your primary care physician to discuss the increased heart rate with Cymbalta. ? ?3.  Keep already scheduled follow-up appointment on April 19 at 11 AM with Dr. Ernst Bowler ? ?Control of Dog or Cat Allergen ?Avoidance is the best way to manage a dog or cat allergy. If you have a dog or cat and are  allergic to dog or cats, consider removing the dog or cat from the home. ?If you have a dog or cat but don?t want to find it a new home, or if your family wants a pet even though someone in the household is allergic, here are some strategies that may help keep symptoms at bay: ? ?Keep the pet out of your bedroom and restrict it to only a few rooms. Be advised that keeping the dog or cat in only one room will not limit the allergens to that room. ?Don?t pet, hug or kiss the dog or cat; if you do, wash your hands with soap and water. ?High-efficiency particulate air (HEPA) cleaners run continuously in a bedroom or living room can reduce allergen levels over time. ?Regular use of a high-efficiency vacuum cleaner or a central vacuum can reduce allergen levels. ?Giving your dog or cat a bath at least once a week can reduce airborne allergen. ? ? ?

## 2022-01-17 NOTE — Progress Notes (Signed)
[name] [name] [id number] Dept: [phone]  FOLLOW UP NOTE  Patient ID: Christina Esparza, female    DOB: 17-Nov-1961  Age: 61 y.o. MRN: [id number] Date of Office Visit: 01/17/2022  Assessment  Chief Complaint: No chief complaint on file.  HPI Christina Esparza is a 61 year old female who presents today to discuss her asthma.  She was last seen on November 03, 2021 by Dr. 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  Since her last office visit she denies any new diagnosis or surgeries.  COPD with asthma overlap syndrome is reported as not well controlled with albuterol as needed.  She reports that she stopped taking Spiriva because she was not sure what she was supposed to be taking.  She denies any side effects while taking Spiriva.  She mentions in the past that when she took inhalers with steroids that her head felt funny.  She feels that she cannot take 2 steroid medications at a time-her Xhance and a steroid inhaler for her lungs.  She wonders if this could be due to her being stressed.  She mentions that she has had panic attacks since age 46 and was put on Zoloft, but approximately 1-1/2 weeks ago her primary care stopped Zoloft and put her on Cymbalta.  She feels like Cymbalta makes her heart beat fast.  She mentions that she lost her mother in September 2021 and her dad in May 2022.  She and her sister are fighting about everything and her 1 sister wont to talk to her.  She reports feeling like she cannot get a good breath, some dry cough that is in her throat, shortness of breath, and a little bit of tightness in her chest.  She denies wheezing, fever, chills, and nocturnal awakenings due to breathing problems.  Since her last office visit she has not required any systemic steroids or made any trips to the emergency  room or urgent care due to breathing problems.  She uses her albuterol 3-4 times a day.  She continues to smoke and is smoking a little over a pack a day.  She verbalizes that she knows she needs to stop smoking.  She was supposed to have a follow-up appointment with pulmonary in December but did not schedule one.  Perennial allergic rhinitis is reported as moderately controlled with Xhance nasal spray, Astelin nasal spray as needed, allergy injections per protocol, and saline rinses as needed.  She mentions that back in December she got a new puppy.  She did not realize that she was allergic to dogs.  She reports sneezing, postnasal drip, and nasal congestion once in a while.  She denies rhinorrhea.  She has not had any sinus infections since we last saw her.  She mentions that she kept the new dog inside the house only for a few days until she got an electric heater.  The dog is now on the porch, but she does have to carry him when he goes to the veterinarian.  She does try to wash her hands after touching the dog.  She does feel like her allergy injections help, but when they started her on a new vial a few weeks ago her symptoms did get worse. She denies any large local reactions with her allergy injections.  Reflux is reported as moderately controlled with Gaviscon as needed.  She reports that  she did try some famotidine as recommended by Dr. Ernst Bowler but then mentions she does not like to take medications like pantoprazole.  In the past she was placed on a high dose and she is now scared of them.  Certain spicy foods cause her reflux to be worse.   Drug Allergies:  Allergies  Allergen Reactions   Omeprazole Other (See Comments)   Amitiza [Lubiprostone] Itching    Patient states that she experienced a pin sticking sensation, prickling all over.   Linaclotide Nausea Only   Cat Hair Extract    Ceftin [Cefuroxime] Itching   Codeine Nausea And Vomiting   Levaquin [Levofloxacin In D5w]     dream    Penicillin G    Penicillins Other (See Comments)    Felt like she was choking Did it involve swelling of the face/tongue/throat, SOB, or low BP? Unknown Did it involve sudden or severe rash/hives, skin peeling, or any reaction on the inside of your mouth or nose? No Did you need to seek medical attention at a hospital or doctor's office? No When did it last happen?~10 years ago If all above answers are "NO", may proceed with cephalosporin use.  Felt like she was choking Felt like she was choking Did it involve swelling of the face/tongue/throat, SOB, or low BP? Unknown Did it involve sudden or severe rash/hives, skin peeling, or any reaction on the inside of your mouth or nose? No Did you need to seek medical attention at a hospital or doctor's office? No When did it last happen?~10 years ago If all above answers are "NO", may proceed with cephalosporin use.     Review of Systems: Review of Systems  Constitutional:  Negative for chills and fever.  HENT:         Reports nasal congestion once in a while and postnasal drip.  Denies rhinorrhea.  Eyes:        Reports occasional itchy watery eyes.  She reports her eye doctor has given her prescription for an antihistamine eyedrop.  Respiratory:  Positive for cough and shortness of breath. Negative for sputum production and wheezing.        Reports dry cough, shortness of breath, and a little bit of tightness in chest.  Denies wheezing and nocturnal awakenings due to breathing problems.  Cardiovascular:  Negative for chest pain and palpitations.       She reports since starting Cymbalta she feels like it is making her heart beat fast.  Gastrointestinal:        Reports occasional heartburn/reflux symptoms if she eats spicy foods.  Genitourinary:  Negative for frequency.  Skin:  Negative for itching and rash.  Neurological:  Negative for headaches.  Endo/Heme/Allergies:  Positive for environmental allergies.    Physical Exam: BP (!)  142/86    Pulse 98    Temp 98.4 F (36.9 C)    Resp 18    Ht 5\' 1"  (1.549 m)    Wt 107 lb 12.8 oz (48.9 kg)    SpO2 99%    BMI 20.37 kg/m    Physical Exam Constitutional:      Appearance: Normal appearance.  HENT:     Head: Normocephalic and atraumatic.     Comments: Pharynx normal, eyes normal, ears normal, nose: Bilateral lower turbinates mildly edematous and slightly erythematous with no drainage noted    Right Ear: Tympanic membrane, ear canal and external ear normal.     Left Ear: Tympanic membrane, ear canal and external ear  normal.     Mouth/Throat:     Mouth: Mucous membranes are moist.     Pharynx: Oropharynx is clear.  Eyes:     Conjunctiva/sclera: Conjunctivae normal.  Cardiovascular:     Rate and Rhythm: Regular rhythm. Tachycardia present.     Heart sounds: Normal heart sounds.  Pulmonary:     Effort: Pulmonary effort is normal.     Breath sounds: Normal breath sounds.     Comments: Lungs clear to auscultation Musculoskeletal:     Cervical back: Neck supple.  Skin:    General: Skin is warm.  Neurological:     Mental Status: She is alert and oriented to person, place, and time.  Psychiatric:        Mood and Affect: Mood normal.        Behavior: Behavior normal.        Thought Content: Thought content normal.        Judgment: Judgment normal.    Diagnostics: FVC 2.21 L, FEV1 1.19 L (54%).  Predicted FVC 2.78 L, predicted FEV1 2.20 L.  Spirometry indicates possible moderately severe obstruction.  Monitor is decreased from last office visit.  Assessment and Plan: 1. Asthma-COPD overlap syndrome ([place])   2. Perennial allergic rhinitis   3. Smoking     Meds ordered this encounter  Medications   Tiotropium Bromide Monohydrate (SPIRIVA RESPIMAT) 1.25 MCG/ACT AERS    Sig: Inhale 2 puffs into the lungs daily.    Dispense:  4 g    Refill:  3    Patient Instructions  1. COPD with asthma overlap syndrome -Continue to follow-up with pulmonary -Please get blood  work today looking for causes of hard to treat asthma.  We will call you with results once they are all back -Consider starting Qvar or Alvesco at your next office visit.  These are both inhalers that contain steroids, but they are prodrug and do not start working until they get into the lungs. -Discussed and encouraged smoking cessation and how it would help with her breathing symptoms - Daily controller medication(s): Start Spriva Respimat 1.25 mcg two puffs once daily to help prevent cough and wheeze. (2 samples given) - Prior to physical activity: albuterol 2 puffs 10-15 minutes before physical activity. - Rescue medications: albuterol 4 puffs every 4-6 hours as needed or albuterol nebulizer one vial every 4-6 hours as needed - Asthma control goals:  * Full participation in all desired activities (may need albuterol before activity) * Albuterol use two time or less a week on average (not counting use with activity) * Cough interfering with sleep two time or less a month * Oral steroids no more than once a year * No hospitalizations  2. Perennial allergic rhinitis (cat, dog) - Continue with Xhance one spray per nostril daily. - You can continue to use the Astelin twice daily as needed (this is the nasty tasting one). - Continue with: nasal saline rinses, but use these more frequently when symptoms are worse  - Continue with: Allegra 180 mg once a day as needed for runny nose/itching - Continue with allergy injections per protocol and have access to epinephrine auto-injector.  Schedule an appointment with your primary care physician to discuss the increased heart rate with Cymbalta.  3.  Keep already scheduled follow-up appointment on April 19 at 11 AM with Dr. Ernst Bowler  Control of Dog or Cat Allergen Avoidance is the best way to manage a dog or cat allergy. If you have a dog  or cat and are allergic to dog or cats, consider removing the dog or cat from the home. If you have a dog or cat  but dont want to find it a new home, or if your family wants a pet even though someone in the household is allergic, here are some strategies that may help keep symptoms at bay:  Keep the pet out of your bedroom and restrict it to only a few rooms. Be advised that keeping the dog or cat in only one room will not limit the allergens to that room. Dont pet, hug or kiss the dog or cat; if you do, wash your hands with soap and water. High-efficiency particulate air (HEPA) cleaners run continuously in a bedroom or living room can reduce allergen levels over time. Regular use of a high-efficiency vacuum cleaner or a central vacuum can reduce allergen levels. Giving your dog or cat a bath at least once a week can reduce airborne allergen.    Return in about 7 weeks (around 03/07/2022), or if symptoms worsen or fail to improve.    Thank you for the opportunity to care for this patient.  Please do not hesitate to contact me with questions.  Althea Charon, FNP [facility]

## 2022-01-23 LAB — CBC WITH DIFFERENTIAL
Basophils Absolute: 0.1 10*3/uL (ref 0.0–0.2)
Basos: 3 %
EOS (ABSOLUTE): 0.3 10*3/uL (ref 0.0–0.4)
Eos: 8 %
Hematocrit: 39 % (ref 34.0–46.6)
Hemoglobin: 12.9 g/dL (ref 11.1–15.9)
Immature Grans (Abs): 0 10*3/uL (ref 0.0–0.1)
Immature Granulocytes: 0 %
Lymphocytes Absolute: 1.8 10*3/uL (ref 0.7–3.1)
Lymphs: 41 %
MCH: 31 pg (ref 26.6–33.0)
MCHC: 33.1 g/dL (ref 31.5–35.7)
MCV: 94 fL (ref 79–97)
Monocytes Absolute: 0.3 10*3/uL (ref 0.1–0.9)
Monocytes: 7 %
Neutrophils Absolute: 1.7 10*3/uL (ref 1.4–7.0)
Neutrophils: 41 %
RBC: 4.16 x10E6/uL (ref 3.77–5.28)
RDW: 13.2 % (ref 11.7–15.4)
WBC: 4.3 10*3/uL (ref 3.4–10.8)

## 2022-01-23 LAB — ASPERGILLUS FUMIGATUS IGG: Aspergillus fumigatus IgG: 29.8 mg/L (ref 0.0–66.5)

## 2022-01-23 LAB — ASPERGILLUS IGE PANEL
A. Amstel/Glaucu Class Interp: 0
A. Flavus Class Interp: 0
A. Fumigatus Class Interp: 0
A. Nidulans Class Interp: 0
A. Niger Class Interp: 0
A. Versicolor Class Interp: 0
Aspergillus amstel/glaucu IgE*: <0.35 kU/L (ref <0.35)
Aspergillus flavus IgE: <0.35 kU/L (ref <0.35)
Aspergillus fumigatus IgE: <0.1 kU/L (ref <0.35)
Aspergillus nidulans IgE: <0.35 kU/L (ref <0.35)
Aspergillus niger IgE: <0.1 kU/L (ref <0.35)
Aspergillus versicolor IgE: <0.1 kU/L (ref <0.35)

## 2022-01-23 LAB — ANCA TITERS
Atypical pANCA: <1:20 {titer}
C-ANCA: <1:20 {titer}
P-ANCA: <1:20 {titer}

## 2022-01-23 LAB — ALPHA-1-ANTITRYPSIN: A-1 Antitrypsin: 100 mg/dL — ABNORMAL LOW (ref 101–187)

## 2022-01-26 ENCOUNTER — Ambulatory Visit (INDEPENDENT_AMBULATORY_CARE_PROVIDER_SITE_OTHER): Payer: Commercial Managed Care - PPO | Admitting: *Deleted

## 2022-01-26 DIAGNOSIS — J309 Allergic rhinitis, unspecified: Secondary | ICD-10-CM

## 2022-02-02 ENCOUNTER — Ambulatory Visit (INDEPENDENT_AMBULATORY_CARE_PROVIDER_SITE_OTHER): Payer: Commercial Managed Care - PPO

## 2022-02-02 DIAGNOSIS — J309 Allergic rhinitis, unspecified: Secondary | ICD-10-CM | POA: Diagnosis not present

## 2022-02-02 NOTE — Addendum Note (Signed)
Addended by: Althea Charon on: 02/02/2022 09:45 AM ? ? Modules accepted: Orders ? ?

## 2022-02-02 NOTE — Progress Notes (Signed)
Please let Christina Esparza know that we have her lab results,but that not all of the lab work was done. I am not sure why. I am going to put in new orders if she could get this done and we will call her with results once they are all back. ? ?Alpha one antitrypsin level: this is slightly decreased. I would like to get additional lab work to follow up on this. ? ?ANCA titers: negative this rules out Wegener's granulomatosis, which can present with difficult to control asthma. ? ?Aspergillus titers: negative: this rules  out something called allergic bronchopulmonary aspergillosis which can be present with difficult to control asthma. ? ?Complete blood count: it shows elevated eosinophils, which can cause your asthma to flare. You qualify for a biologic drug to better help your asthma. Options are: Nucala, Dupixent and Fasenra. Nucala has longer safety data. Please send her information on Nucala if she is interested. ? ?Althea Charon, FNP

## 2022-02-08 LAB — ALPHA-1-ANTITRYPSIN PHENOTYP: A-1 Antitrypsin: 103 mg/dL (ref 101–187)

## 2022-02-08 LAB — IGE: IgE (Immunoglobulin E), Serum: 30 IU/mL (ref 6–495)

## 2022-02-09 ENCOUNTER — Ambulatory Visit (INDEPENDENT_AMBULATORY_CARE_PROVIDER_SITE_OTHER): Payer: Commercial Managed Care - PPO

## 2022-02-09 DIAGNOSIS — J309 Allergic rhinitis, unspecified: Secondary | ICD-10-CM

## 2022-02-09 NOTE — Progress Notes (Signed)
Please let  Christina Esparza know that we got her lab results. How is she doing breathing wise? Is she using Spiriva Respimat 1.25 mcg 2 puffs once a day? ? ?Please send her information on Nucala which can help her asthma.  If she is interested in starting [place] once she looks over the information. She can call our office and let us know. ? ?spoke with Dr. Ernst Bowler will go over her other lab results at her next appointment on 03/07/22 @ 11am. Please make sure to keep this appointment

## 2022-02-13 ENCOUNTER — Other Ambulatory Visit: Payer: Self-pay | Admitting: *Deleted

## 2022-02-13 MED ORDER — ALVESCO 80 MCG/ACT IN AERS: 1.0000 | INHALATION_SPRAY | Freq: Two times a day (BID) | RESPIRATORY_TRACT | 5 refills | Status: DC

## 2022-02-13 NOTE — Progress Notes (Signed)
Lets start with Alvesco 80 mcg 1 puff twice a day with spacer. Make sure to rinse your mouth out after to help prevent thrush (yeast).please attach the copay card.  Make sure she continues Spiriva Respimat 1.25 mcg 2 puffs once a day.

## 2022-02-13 NOTE — Progress Notes (Signed)
Would she be interested in starting Qvar or Alvesco ?  These are both inhalers that contain steroids, but they are prodrug and do not start working until they get into the lungs. She would use one of the inhalers above along with the Spiriva.

## 2022-02-14 ENCOUNTER — Ambulatory Visit (INDEPENDENT_AMBULATORY_CARE_PROVIDER_SITE_OTHER): Payer: Commercial Managed Care - PPO

## 2022-02-14 DIAGNOSIS — J309 Allergic rhinitis, unspecified: Secondary | ICD-10-CM | POA: Diagnosis not present

## 2022-02-16 DIAGNOSIS — J3081 Allergic rhinitis due to animal (cat) (dog) hair and dander: Secondary | ICD-10-CM | POA: Diagnosis not present

## 2022-02-16 NOTE — Progress Notes (Signed)
EXP 02/17/23 ?

## 2022-02-21 ENCOUNTER — Ambulatory Visit (INDEPENDENT_AMBULATORY_CARE_PROVIDER_SITE_OTHER): Payer: Commercial Managed Care - PPO

## 2022-02-21 DIAGNOSIS — J309 Allergic rhinitis, unspecified: Secondary | ICD-10-CM | POA: Diagnosis not present

## 2022-02-21 MED ORDER — EPINEPHRINE 0.3 MG/0.3ML IJ SOAJ
0.3000 mg | INTRAMUSCULAR | 1 refills | Status: DC | PRN
Start: 2022-02-21 — End: 2024-08-03

## 2022-03-05 IMAGING — MG MM DIGITAL SCREENING BILAT W/ TOMO AND CAD
9 series · 9 of 29 positions shown · non-contrast
Comparison: Previous exam(s).

CLINICAL DATA: Screening.

EXAM:
DIGITAL SCREENING BILATERAL MAMMOGRAM WITH TOMOSYNTHESIS AND CAD
TECHNIQUE: Bilateral screening digital craniocaudal and mediolateral oblique
mammograms were obtained. Bilateral screening digital breast
tomosynthesis was performed. The images were evaluated with
computer-aided detection.

[L CC synth-2D]
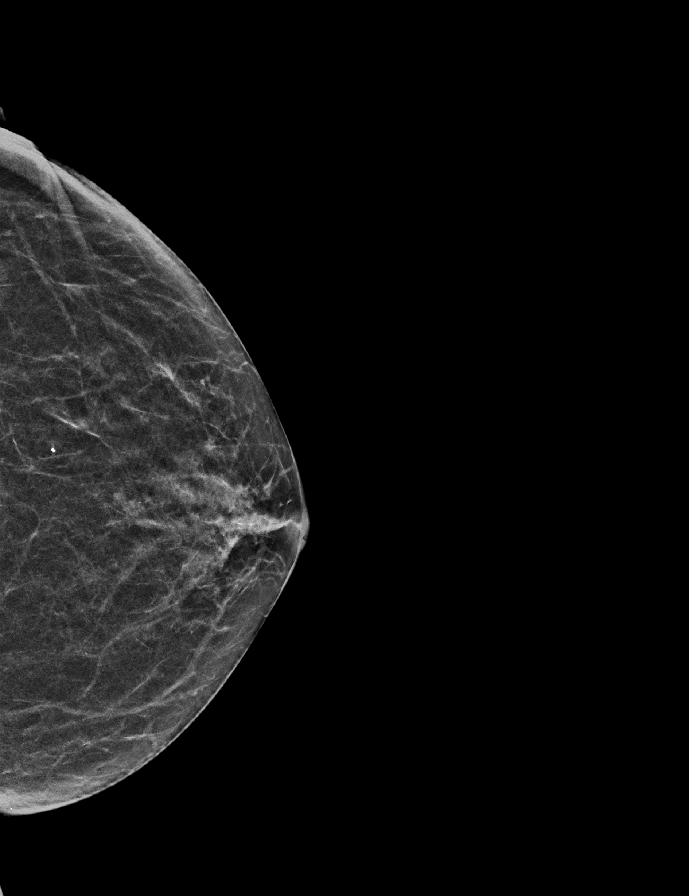

[R CC synth-2D]
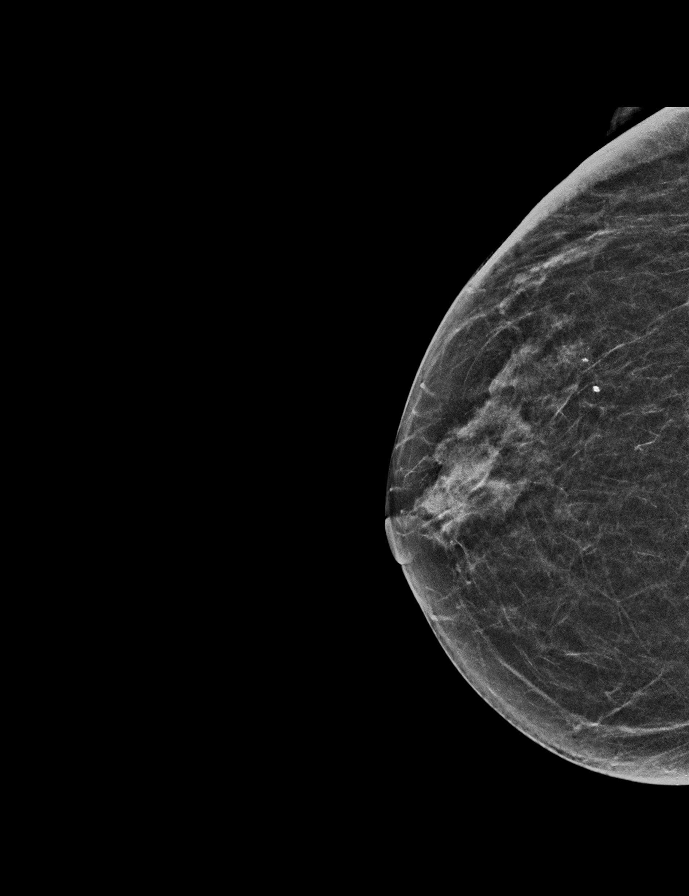

[R MLO synth-2D]
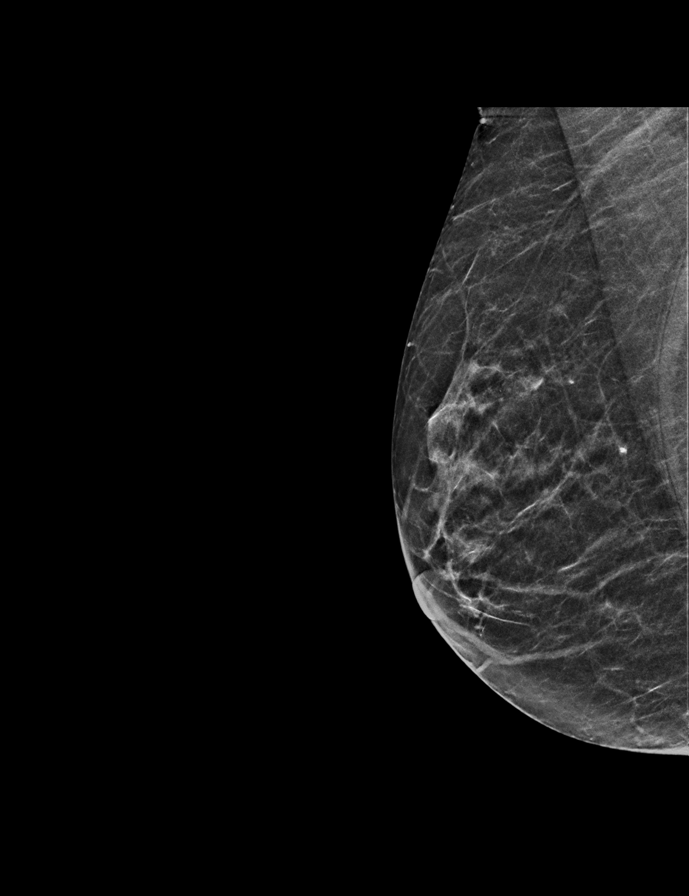

[L MLO synth-2D]
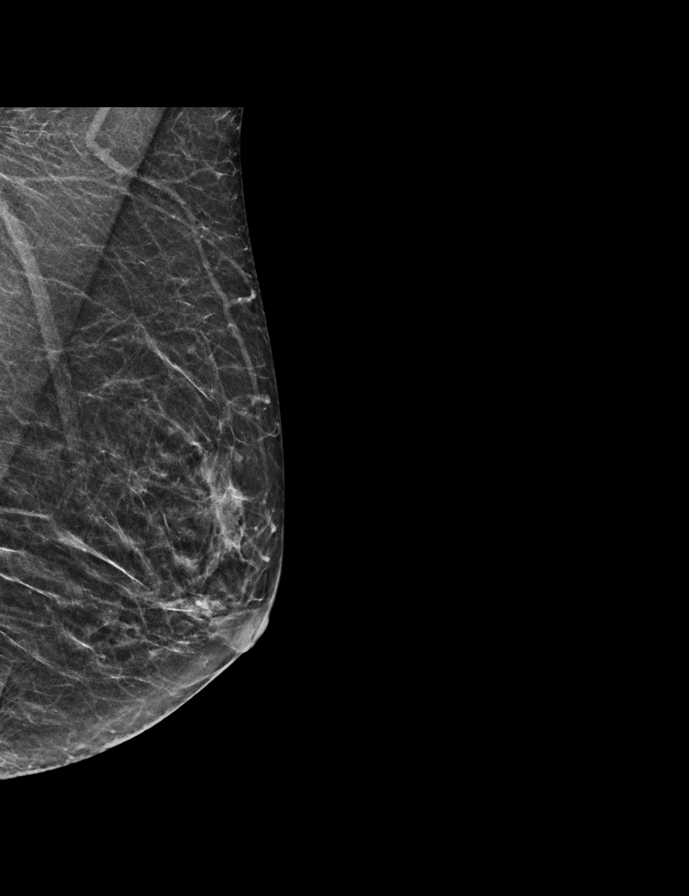

[L CC tomo · tomo slice 21/41.0]
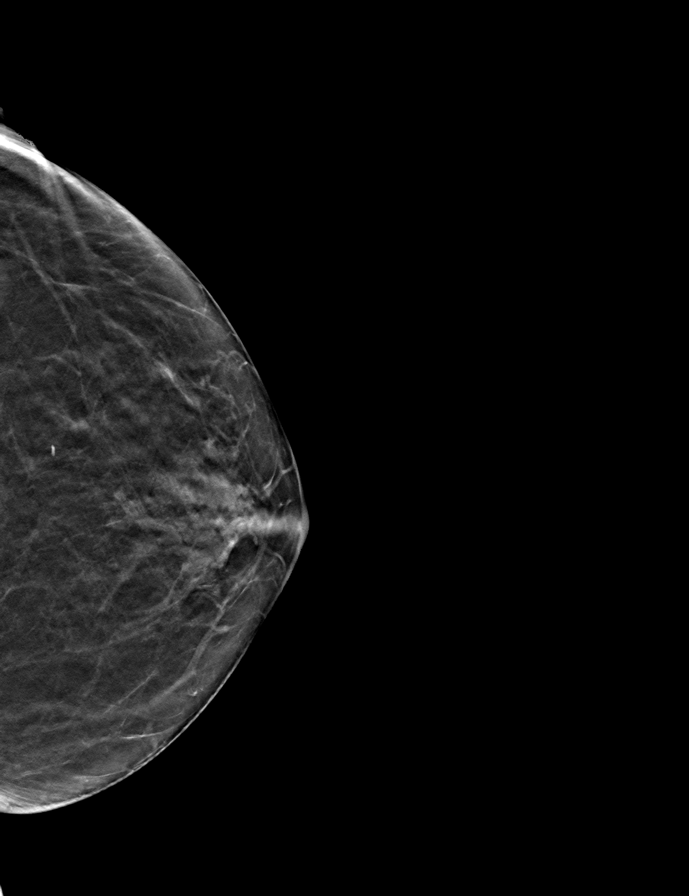

[R MLO tomo · tomo slice 21/41.0]
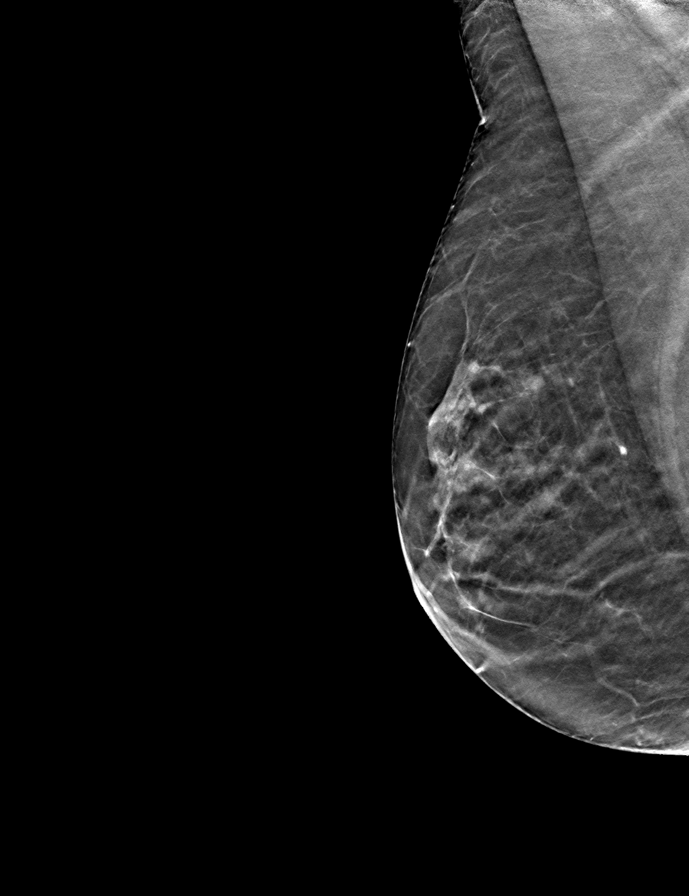

[L MLO tomo (1 of 2) · tomo slice 23/46.0]
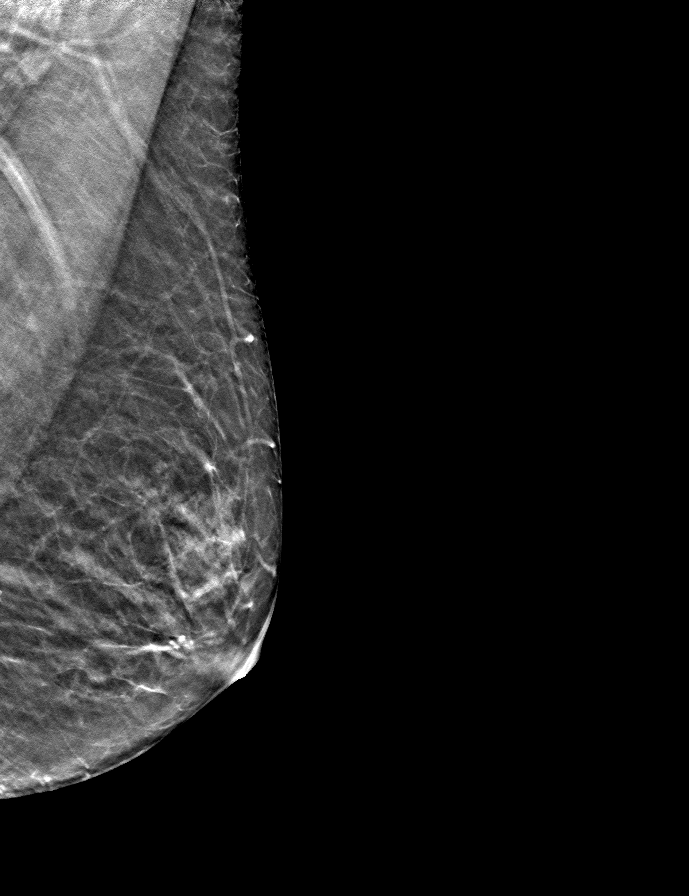

[R CC tomo · tomo slice 23/45.0]
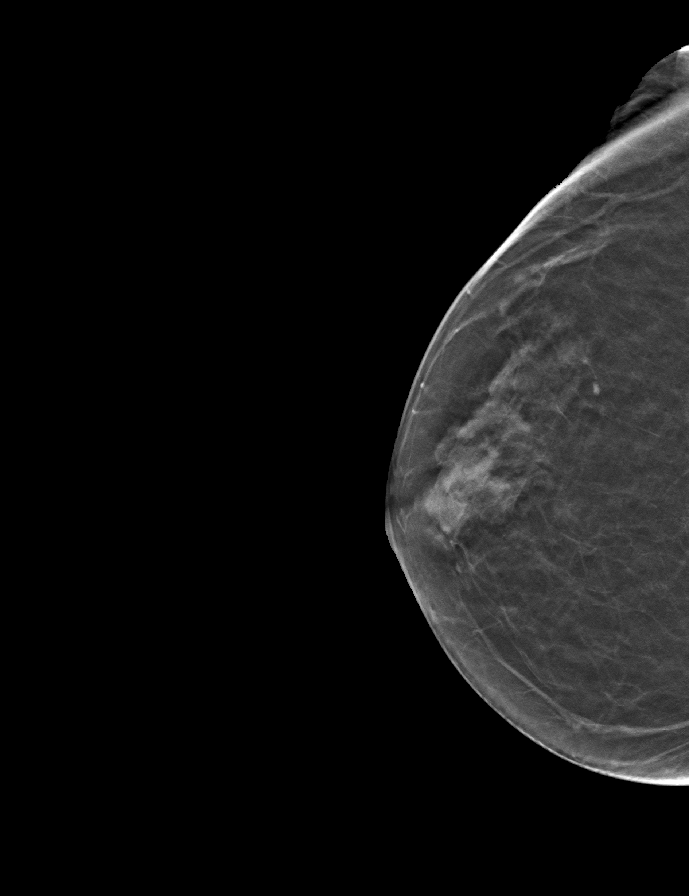

[L MLO tomo (2 of 2) · tomo slice 22/43.0]
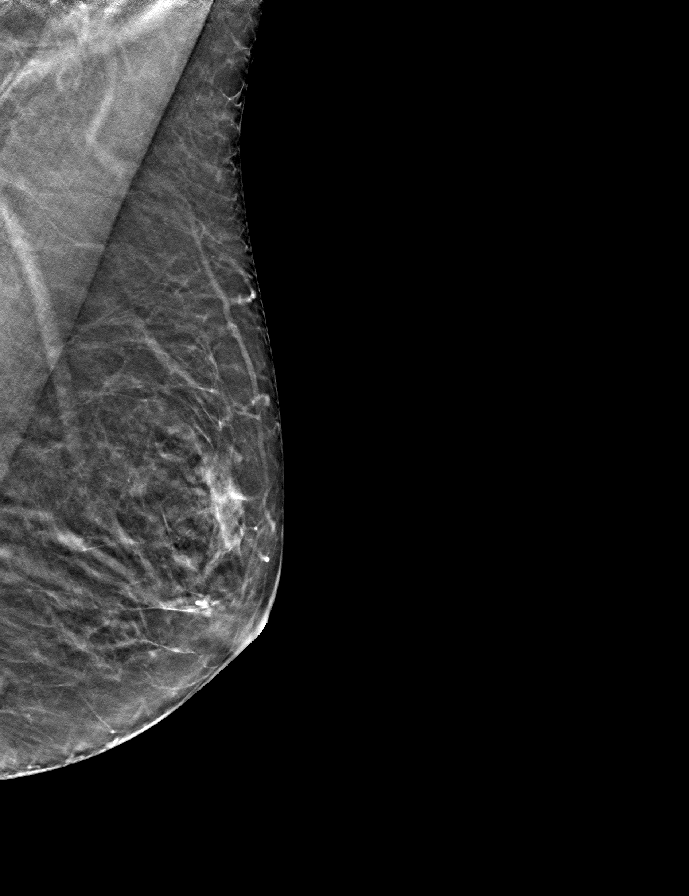

[9 of 29 positions shown; findings below may reference images not displayed]

ACR Breast Density Category b: There are scattered areas of
fibroglandular density.
FINDINGS: There are no findings suspicious for malignancy.
IMPRESSION: No mammographic evidence of malignancy. A result letter of this
screening mammogram will be mailed directly to the patient.

RECOMMENDATION:
Screening mammogram in one year. (Code:51-O-LD2)

BI-RADS CATEGORY  1: Negative.

## 2022-03-07 ENCOUNTER — Ambulatory Visit (INDEPENDENT_AMBULATORY_CARE_PROVIDER_SITE_OTHER): Payer: Commercial Managed Care - PPO

## 2022-03-07 ENCOUNTER — Ambulatory Visit (INDEPENDENT_AMBULATORY_CARE_PROVIDER_SITE_OTHER): Payer: Commercial Managed Care - PPO | Admitting: Allergy & Immunology

## 2022-03-07 ENCOUNTER — Encounter: Payer: Self-pay | Admitting: Allergy & Immunology

## 2022-03-07 VITALS — BP 116/72 | HR 99 | Temp 99.6°F | Resp 16 | Ht 60.0 in | Wt 104.8 lb

## 2022-03-07 DIAGNOSIS — J331 Polypoid sinus degeneration: Secondary | ICD-10-CM | POA: Diagnosis not present

## 2022-03-07 DIAGNOSIS — J3089 Other allergic rhinitis: Secondary | ICD-10-CM | POA: Diagnosis not present

## 2022-03-07 DIAGNOSIS — F17200 Nicotine dependence, unspecified, uncomplicated: Secondary | ICD-10-CM

## 2022-03-07 DIAGNOSIS — J449 Chronic obstructive pulmonary disease, unspecified: Secondary | ICD-10-CM | POA: Diagnosis not present

## 2022-03-07 DIAGNOSIS — J309 Allergic rhinitis, unspecified: Secondary | ICD-10-CM

## 2022-03-07 MED ORDER — ALBUTEROL SULFATE HFA 108 (90 BASE) MCG/ACT IN AERS: 2.0000 | INHALATION_SPRAY | Freq: Four times a day (QID) | RESPIRATORY_TRACT | 2 refills | Status: DC | PRN

## 2022-03-07 MED ORDER — ALVESCO 80 MCG/ACT IN AERS: 2.0000 | INHALATION_SPRAY | Freq: Two times a day (BID) | RESPIRATORY_TRACT | 5 refills | Status: DC

## 2022-03-07 NOTE — Progress Notes (Signed)
? ?FOLLOW UP ? ?Date of Service/Encounter:  03/07/22 ? ? ?Assessment:  ? 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  ?Perennial allergic rhinitis (cat, dog) - on allergen immunotherapy ?  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  ?Multiple social stressors - with a history of underlying anxiety ? 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 ? ?[name] presents for follow-up visit.  Her COPD with asthma overlap continues to be an issue.  She has had various side effects to a number of inhalers which have limited what we have been able to treat her with.  She seems to be tolerating Alvesco, so we will try to continue with that.  I do not think she needs to continue with nothing on board.  Triple therapy inhaler would probably be the best bet for her.  But she has never tolerated Trelegy or Breztri.  We did get alpha-1 antitrypsin genotype that showed that she is an MZ genotype, which does predispose her to alpha-1 antitrypsin deficiency especially in the setting of cigarette exposure.  We did discuss this diagnosis today and I sent her some information on it.  I think it would be more prudent to start with a biologic first and then consider alpha 1 antitrypsin supplementation.  Overall, her FEV1 has been slowly dropping over the course of 2022.  It has been lower than it is today, very much in 2022 and was 200 to 300 mL higher.  We are to see her in close follow-up.  Hopefully she starts the [place] soon and this helps her overall clinical status.  I think we have done a low threshold for starting alpha-1 antitrypsin replacement. ?  ? ?Plan/Recommendations:  ? ? ?1. COPD with asthma overlap syndrome ?- Lung function looks stable. ?- We are going to start with Nucala and see how you do with this. ?- Consent signed today. ?- You also had genetic changes that make you predisposed to complications from alpha one antitrypsin deficiency (information sent to your email). ?- This is made even  worse by smoking since the cigarette smoke deactivates the enzyme that is working in your lungs.  ?- Daily controller medication(s): Alvesco two puffs twice daily. ?- Prior to physical activity: albuterol 2 puffs 10-15 minutes before physical activity. ?- Rescue medications: albuterol 4 puffs every 4-6 hours as needed ?- Asthma control goals:  ?* Full participation in all desired activities (may need albuterol before activity) ?* Albuterol use two time or less a week on average (not counting use with activity) ?* Cough interfering with sleep two time or less a month ?* Oral steroids no more than once a year ?* No hospitalizations ? ?2. Perennial allergic rhinitis (cat, dog) ?- Continue with Xhance one spray per nostril daily. ?- You can continue to use the Astelin twice daily as needed (this is the nasty tasting one). ?- Continue with: nasal saline rinses, but use these more frequently when symptoms are worse  ?- Continue with: Allegra 180 mg once a day as needed for runny nose/itching ?- Continue with allergy shots at the same schedule. ?- Try Sudafed to see if this helps with the ear issues.  ? ?3. Return in about 6 weeks (around 04/18/2022).  ? ?Subjective:  ? ?Christina Esparza is a 61 y.o. female presenting today for follow up of  ?Chief Complaint  ?Patient presents with  ? Asthma  ?  Tried the Spiriva and the alvesco is helping some just  picked it up from the pharmacy.   ? Allergic Reaction  ?  Has been having horrible allergies due to her new lab dog. He sheds a lot. Constant ear pressure with fluid and was given antivert. Constant watery eyes   ? ? ?Christina Esparza has a history of the following: ?Patient Active Problem List  ? Diagnosis Date Noted  ? Low back pain 04/05/2021  ? Peroneal neuropathy, right 04/05/2021  ? Carpal tunnel syndrome on right 03/23/2021  ? Primary osteoarthritis of both first carpometacarpal joints 03/23/2021  ? Right wrist pain 03/23/2021  ? Pain in right hip 12/28/2020  ? Stage 2  moderate COPD by GOLD classification ([place]) 09/14/2020  ? Osteoarthritis cervical spine 05/04/2020  ? Gastroesophageal reflux disease 10/22/2019  ? GERD (gastroesophageal reflux disease) 10/21/2019  ? Shortness of breath 10/21/2019  ? Primary osteoarthritis of first carpometacarpal joint of right hand 09/17/2017  ? Anxiety state 08/24/2015  ? ? ?History obtained from: chart review and patient. ? ?Christina Esparza is a 61 y.o. female presenting for a follow up visit.  She was last seen in March 2020.  By Christina Esparza.  At that time, she discussed starting an inhaled steroid such as Alvesco or Qvar which are pretty drugs.  She also was started on Spiriva.  For her rhinitis, she was continued on allergy shots as well as Manufacturing engineer. ? ?Labs from the last visit demonstrated an negative ANCA titers.  She also had Aspergillus titers that were negative.  Her CBC showed an elevated eosinophil count.  Nucala was discussed with her.  Alpha-1 antitrypsin demonstrated a level of 100 with a genotype of MZ. ? ?Since the last visit, she has mostly done well. She did start having worsening symptoms when she got a puppy in December 2022. She got a puppy for her husband's Christmas gift. Now he is no longer living on the porch with the heat lamp, but he is now living outdoors in a large field. He does a lot of shedding, but they have cleaned the porch off completely. The dog does NOT go into the house at all. She knows that the dander was likely coming into the home due to worsening wheezing and coughing.  ? ?Asthma/Respiratory Symptom History: She remains on the Alvesco one puff twice daily. She is paying $25 pre month for this inhaler and she seems fine with that . She has had a lot of side effects from her various inhaled medications. She does not like taking more than one steroid at a time. She has information on the Nucala. She is not interested in administering this at home. She feels that we do more than Pulmonology, so she  prefers to just see [place].  ? ?Allergic Rhinitis Symptom History: She has been to see ENT, but she knows that she is has bilateral ear pain. She has seen ENT in [place]. She has not seen anyone in [place]. She is told that there is nothing wrong with her ears.   But she knows that is uncomfortable. She remains on the [place]. But she has noticed that her breathing as been tight. ? ?Christina Esparza is on allergen immunotherapy. She receives one injection. Immunotherapy script #1 contains cat and dog. She currently receives 0.42m of the RED vial (1/100). She started shots January of 2022 and reached maintenance in October of 2022. ? ?She had to increase the Cymbalta this week. This is all managed by Dr. FGerarda Fraction  She hopes that this helps with her anxiety.  She realizes that anxiety certainly plays into a lot of her symptoms. ? ?Otherwise, there have been no changes to her past medical history, surgical history, family history, or social history. ? ? ? ?Review of Systems  ?Constitutional:  Positive for malaise/fatigue. Negative for chills, fever and weight loss.  ?HENT:  Negative for congestion and nosebleeds.   ?     Reports nasal congestion once in a while and postnasal drip.  Denies rhinorrhea.  ?Eyes:   ?     Reports occasional itchy watery eyes.  She reports her eye doctor has given her prescription for an antihistamine eyedrop.  ?Respiratory:  Positive for cough and shortness of breath. Negative for sputum production and wheezing.   ?     Reports dry cough, shortness of breath, and a little bit of tightness in chest.  Denies wheezing and nocturnal awakenings due to breathing problems.  ?Cardiovascular:  Negative for chest pain and palpitations.  ?     She reports since starting Cymbalta she feels like it is making her heart beat fast.  ?Gastrointestinal:   ?     Reports occasional heartburn/reflux symptoms if she eats spicy foods.  ?Genitourinary:  Negative for frequency.  ?Skin:  Negative for itching and rash.  ?Neurological:   Negative for headaches.  ?Endo/Heme/Allergies:  Positive for environmental allergies.  ?Psychiatric/Behavioral:  The patient is nervous/anxious.   ?All other systems reviewed and are negative.  ? ? ? ?Objec

## 2022-03-07 NOTE — Patient Instructions (Addendum)
1. COPD with asthma overlap syndrome ?- Lung function looks stable. ?- We are going to start with Nucala and see how you do with this. ?- Consent signed today. ?- You also had genetic changes that make you predisposed to complications from alpha one antitrypsin deficiency (information sent to your email). ?- This is made even worse by smoking since the cigarette smoke deactivates the enzyme that is working in your lungs.  ?- Daily controller medication(s): Alvesco two puffs twice daily. ?- Prior to physical activity: albuterol 2 puffs 10-15 minutes before physical activity. ?- Rescue medications: albuterol 4 puffs every 4-6 hours as needed ?- Asthma control goals:  ?* Full participation in all desired activities (may need albuterol before activity) ?* Albuterol use two time or less a week on average (not counting use with activity) ?* Cough interfering with sleep two time or less a month ?* Oral steroids no more than once a year ?* No hospitalizations ? ?2. Perennial allergic rhinitis (cat, dog) ?- Continue with Xhance one spray per nostril daily. ?- You can continue to use the Astelin twice daily as needed (this is the nasty tasting one). ?- Continue with: nasal saline rinses, but use these more frequently when symptoms are worse  ?- Continue with: Allegra 180 mg once a day as needed for runny nose/itching ?- Continue with allergy shots at the same schedule. ?- Try Sudafed to see if this helps with the ear issues.  ? ?3. Return in about 6 weeks (around 04/18/2022).  ? ? ?Please inform us of any Emergency Department visits, hospitalizations, or changes in symptoms. Call us before going to the ED for breathing or allergy symptoms since we might be able to fit you in for a sick visit. Feel free to contact us anytime with any questions, problems, or concerns. ? ?It was a pleasure to see you again today! ? ?Websites that have reliable patient information: ?1. American Academy of Asthma, Allergy, and Immunology:  www.aaaai.org ?2. Food Allergy Research and Education (FARE): foodallergy.org ?3. Mothers of Asthmatics: http://www.asthmacommunitynetwork.org ?4. [employer] of Allergy, Asthma, and Immunology: [email or web address] ? ? ?COVID-19 Vaccine Information can be found at: [email or web address] For questions related to vaccine distribution or appointments, please email [email or web address] or call [phone].  ? ?We realize that you might be concerned about having an allergic reaction to the COVID19 vaccines. To help with that concern, WE ARE OFFERING THE COVID19 VACCINES IN OUR OFFICE! Ask the front desk for dates!  ? ? ? ??Like? [place] on Facebook and Instagram for our latest updates!  ?  ? ? ?A healthy democracy works best when [employer] participate! Make sure you are registered to vote! If you have moved or changed any of your contact information, you will need to get this updated before voting! ? ?In some cases, you MAY be able to register to vote online: [email or web address] ? ? ? ? ? ?

## 2022-03-09 ENCOUNTER — Encounter: Payer: Self-pay | Admitting: Allergy & Immunology

## 2022-03-12 ENCOUNTER — Telehealth: Payer: Self-pay | Admitting: *Deleted

## 2022-03-12 NOTE — Telephone Encounter (Signed)
-----   Message from Valentina Shaggy, MD sent at 03/09/2022  5:46 AM EDT ----- ?Hey there - I think she is open to starting Nucala. She signed the consent form. Maybe we can give her a sample to get her started sooner? Her FEV1 has been dropping slowly over the last 6 months.  ?

## 2022-03-12 NOTE — Telephone Encounter (Signed)
Called patient and advised approval, copay card and submit for Nucala. At this time patient wants to get same in clinic but may try to admin later.  ?

## 2022-03-13 NOTE — Telephone Encounter (Signed)
She is way too nervous to give it to herself at home right now.  I agree, lets keep it in the office. ?

## 2022-03-21 ENCOUNTER — Ambulatory Visit (INDEPENDENT_AMBULATORY_CARE_PROVIDER_SITE_OTHER): Payer: Commercial Managed Care - PPO

## 2022-03-21 DIAGNOSIS — J4550 Severe persistent asthma, uncomplicated: Secondary | ICD-10-CM

## 2022-03-21 MED ORDER — MEPOLIZUMAB 100 MG ~~LOC~~ SOLR
100.0000 mg | SUBCUTANEOUS | Status: DC
  Administered 2022-03-21 – 2023-07-15 (×17): 100 mg via SUBCUTANEOUS

## 2022-03-21 NOTE — Progress Notes (Signed)
Immunotherapy ? ? ?Patient Details  ?Name: Christina Esparza ?MRN: [id number] ?Date of Birth: 09/03/1961 ? ?03/21/2022 ? ?[name] started injections for  Nucala. ?Frequency: Every four weeks.  ?Epi-Pen:Epi-Pen Available  ?Consent signed and patient instructions given. Patient waited in office for thirty minutes without an issue.  ? ? ?[name] ?03/21/2022, 2:03 PM ? ? ?

## 2022-03-23 ENCOUNTER — Ambulatory Visit (INDEPENDENT_AMBULATORY_CARE_PROVIDER_SITE_OTHER): Payer: Commercial Managed Care - PPO

## 2022-03-23 DIAGNOSIS — J309 Allergic rhinitis, unspecified: Secondary | ICD-10-CM

## 2022-03-30 ENCOUNTER — Ambulatory Visit (INDEPENDENT_AMBULATORY_CARE_PROVIDER_SITE_OTHER): Payer: Commercial Managed Care - PPO

## 2022-03-30 DIAGNOSIS — J309 Allergic rhinitis, unspecified: Secondary | ICD-10-CM | POA: Diagnosis not present

## 2022-04-04 ENCOUNTER — Ambulatory Visit (INDEPENDENT_AMBULATORY_CARE_PROVIDER_SITE_OTHER): Payer: Commercial Managed Care - PPO

## 2022-04-04 DIAGNOSIS — J309 Allergic rhinitis, unspecified: Secondary | ICD-10-CM

## 2022-04-13 ENCOUNTER — Ambulatory Visit (INDEPENDENT_AMBULATORY_CARE_PROVIDER_SITE_OTHER): Payer: Commercial Managed Care - PPO

## 2022-04-13 DIAGNOSIS — J309 Allergic rhinitis, unspecified: Secondary | ICD-10-CM

## 2022-04-18 ENCOUNTER — Encounter: Payer: Self-pay | Admitting: Allergy & Immunology

## 2022-04-18 ENCOUNTER — Ambulatory Visit: Payer: Commercial Managed Care - PPO

## 2022-04-18 ENCOUNTER — Ambulatory Visit (INDEPENDENT_AMBULATORY_CARE_PROVIDER_SITE_OTHER): Payer: Commercial Managed Care - PPO | Admitting: Allergy & Immunology

## 2022-04-18 ENCOUNTER — Other Ambulatory Visit: Payer: Self-pay

## 2022-04-18 VITALS — BP 110/70 | HR 91 | Temp 97.9°F | Resp 17

## 2022-04-18 DIAGNOSIS — J449 Chronic obstructive pulmonary disease, unspecified: Secondary | ICD-10-CM

## 2022-04-18 DIAGNOSIS — J4550 Severe persistent asthma, uncomplicated: Secondary | ICD-10-CM

## 2022-04-18 DIAGNOSIS — J309 Allergic rhinitis, unspecified: Secondary | ICD-10-CM

## 2022-04-18 NOTE — Progress Notes (Unsigned)
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 COPD with asthma overlap syndrome - Lung function looks stable. - We will continue with Nucala monthly. - As a reminder: you had the genetic changes that make you predisposed to complications from alpha one antitrypsin deficiency - This is made even worse by smoking since the cigarette smoke deactivates the enzyme that is working in your lungs.  - Stop the Alvesco and start Spiriva 1.40mg two puffs once daily - Daily controller medication(s): Spiriva 1.252m two puffs once daily - Prior to physical activity: albuterol 2 puffs 10-15 minutes before physical activity. - Rescue medications: albuterol 4 puffs every 4-6 hours as needed - Asthma control goals:  * Full participation in all desired activities (may need albuterol before activity) * Albuterol use two time or less a week on average (not counting use with activity) * Cough interfering with sleep two time or less a month * Oral steroids no more than once a year * No hospitalizations  2. Perennial allergic rhinitis (cat, dog) - Continue with Xhance one spray per nostril daily. - We will hold allergy shots for now.  - Continue with: nasal saline rinses  - Continue with: Allegra 180 mg once a day as needed for runny nose/itching - Continue with: Astelin twice daily as needed - We will refer you to see Christina Esparza is at the same practice as the same you saw last year, but she is a physician as opposed to a PA).  - We can consider Neurosurgery if needed.   3. Return in about 3 months (around 07/19/2022).     Please inform usKoreaf any Emergency Department visits, hospitalizations, or changes in symptoms. Call usKoreaefore going to the ED for breathing or allergy symptoms since we might be able to fit you in for a sick visit. Feel free to contact usKoreanytime with any questions, problems, or concerns.  It was a pleasure to see you again today!  Websites that have reliable patient information: 1. American Academy of Asthma, Allergy, and Immunology: www.aaaai.org 2. Food Allergy Research and Education (FARE): foodallergy.org 3. Mothers of Asthmatics: http://www.asthmacommunitynetwork.org 4. American College of Allergy, Asthma, and Immunology: www.acaai.org   COVID-19 Vaccine Information can be found at: [email or web address] questions related to vaccine distribution or appointments, please email [email or web address] or call [phone]  We realize that you might be concerned about having an allergic reaction to the COVID19 vaccines. To help with that concern, WE ARE OFFERING THE COVID19 VACCINES IN OUR OFFICE! Ask the front desk for dates!     "Like" usKorean Facebook and Instagram for our latest updates!      A healthy democracy works best when ALNew [place] Life Insurancearticipate! Make sure you are registered to vote! If you have moved or changed any of your contact information, you will need to get this updated before voting!  In some cases, you MAY be able to register to vote online: [email or web address]      Subjective:   Christina Esparza a [age].o. female presenting today for follow up of  Chief Complaint  Patient presents with   Asthma   Allergic Rhinitis     Christina Esparza has a history of the following: Patient Active Problem List   Diagnosis Date Noted   Low back pain 04/05/2021   Peroneal neuropathy, right 04/05/2021   Carpal tunnel syndrome on right 03/23/2021   Primary osteoarthritis of  both first carpometacarpal joints 03/23/2021   Right wrist pain 03/23/2021   Pain in right hip 12/28/2020   Stage 2 moderate COPD by GOLD classification ([place]) 09/14/2020   Osteoarthritis cervical spine 05/04/2020   Gastroesophageal reflux disease 10/22/2019   GERD (gastroesophageal reflux disease) 10/21/2019   Shortness of breath 10/21/2019   Primary osteoarthritis of first carpometacarpal joint of right hand 09/17/2017   Anxiety state 08/24/2015    History obtained from: chart review and {Persons; PED relatives w/patient:19415::"patient"}.  Christina Esparza is a 61 y.o. female presenting for {Blank single:19197::"a food challenge","a drug challenge","skin testing","a sick visit","an evaluation of ***","a follow up visit"}.  Since the last visit, she reports that    Asthma/Respiratory Symptom History: She swtiched to Alvesco at some point which was affordable and now it is no longer affordable.  She is doing well with the Nucala. She has gotten two doses now. She felt that she had some fatigue for a day or two after the injections, but her breathing has gotten a lot better.  Allergic Rhinitis Symptom History: Her ears are full and she is dizzy. She is willing to go to an ENT.  She went to the ED in January 2023 and she was diagnosed with "fluid in the ears". She is having a lot of eye watering; this is daily every day. She remains fatigued all of he time.   She does sleep well at night. She gets up in the morning and she is just "kinda tired" She has tried to take away some things to see whether these are medication related. But none of these changes have helped at all.   She has been on a vitamin B12 supplement; this improved with supplementation. She does have a multivitamin but it is unclear how often she is taking them. She was one who snored at one time. She feels better after her nasal surgery. She does not want a sleep study.   She did try Cymbalta. She has only been on it for three months.  She was on '60mg'$  and she is slowly increasing it.   She has had some neck pain and apparently this has been a problem for years. She has seen a chiropracter at some point and was diagnosed with arthritis in her neck.   {Blank single:19197::"Food Allergy Symptom History: ***"," "}  {Blank single:19197::"Skin Symptom History: ***"," "}  {Blank single:19197::"GERD Symptom History: ***"," "}  Otherwise, there have been no changes to her past medical history, surgical history, family history, or social history.    ROS     Objective:   Blood pressure 110/70, pulse 91, temperature 97.9 F (36.6 C), temperature source Temporal, resp. rate 17, SpO2 95 %. There is no height or weight on file to calculate BMI.    Physical Exam   Diagnostic studies:    Spirometry: results abnormal (FEV1: 1.20/57%, FVC: 1.63/61%, FEV1/FVC: 74%).    Spirometry consistent with possible restrictive disease. {Blank single:19197::"Albuterol/Atrovent nebulizer","Xopenex/Atrovent nebulizer","Albuterol nebulizer","Albuterol four puffs via MDI","Xopenex four puffs via MDI"} treatment given in clinic with {Blank single:19197::"significant improvement in FEV1 per ATS criteria","significant improvement in FVC per ATS criteria","significant improvement in FEV1 and FVC per ATS criteria","improvement  in FEV1, but not significant per ATS criteria","improvement in FVC, but not significant per ATS criteria","improvement in FEV1 and FVC, but not significant per ATS criteria","no improvement"}.  Allergy Studies: {Blank single:19197::"none","labs sent instead"," "}    {Blank single:19197::"Allergy testing results were read and interpreted by myself, documented by clinical staff."," "}      Salvatore Marvel, MD  [facility] and [facility]

## 2022-04-18 NOTE — Patient Instructions (Addendum)
1. COPD with asthma overlap syndrome - Lung function looks stable. - We will continue with Nucala monthly. - As a reminder: you had the genetic changes that make you predisposed to complications from alpha one antitrypsin deficiency - This is made even worse by smoking since the cigarette smoke deactivates the enzyme that is working in your lungs.  - Stop the Alvesco and start Spiriva 1.81mg two puffs once daily - Daily controller medication(s): Spiriva 1.294m two puffs once daily - Prior to physical activity: albuterol 2 puffs 10-15 minutes before physical activity. - Rescue medications: albuterol 4 puffs every 4-6 hours as needed - Asthma control goals:  * Full participation in all desired activities (may need albuterol before activity) * Albuterol use two time or less a week on average (not counting use with activity) * Cough interfering with sleep two time or less a month * Oral steroids no more than once a year * No hospitalizations  2. Perennial allergic rhinitis (cat, dog) - Continue with Xhance one spray per nostril daily. - We will hold allergy shots for now.  - Continue with: nasal saline rinses  - Continue with: Allegra 180 mg once a day as needed for runny nose/itching - Continue with: Astelin twice daily as needed - We will refer you to see Dr. SkFredric Dineshe is at the same practice as the same you saw last year, but she is a physician as opposed to a PA).  - We can consider Neurosurgery if needed.   3. Return in about 3 months (around 07/19/2022).    Please inform usKoreaf any Emergency Department visits, hospitalizations, or changes in symptoms. Call usKoreaefore going to the ED for breathing or allergy symptoms since we might be able to fit you in for a sick visit. Feel free to contact usKoreanytime with any questions, problems, or concerns.  It was a pleasure to see you again today!  Websites that have reliable patient information: 1. American Academy of Asthma, Allergy, and  Immunology: www.aaaai.org 2. Food Allergy Research and Education (FARE): foodallergy.org 3. Mothers of Asthmatics: http://www.asthmacommunitynetwork.org 4. American College of Allergy, Asthma, and Immunology: www.acaai.org   COVID-19 Vaccine Information can be found at: [email or web address] questions related to vaccine distribution or appointments, please email [email or web address] or call [phone]  We realize that you might be concerned about having an allergic reaction to the COVID19 vaccines. To help with that concern, WE ARE OFFERING THE COVID19 VACCINES IN OUR OFFICE! Ask the front desk for dates!     "Like" usKorean Facebook and Instagram for our latest updates!      A healthy democracy works best when ALNew [place] Life Insurancearticipate! Make sure you are registered to vote! If you have moved or changed any of your contact information, you will need to get this updated before voting!  In some cases, you MAY be able to register to vote online: [email or web address]

## 2022-04-20 ENCOUNTER — Encounter: Payer: Self-pay | Admitting: Allergy & Immunology

## 2022-04-30 ENCOUNTER — Telehealth: Payer: Self-pay

## 2022-04-30 NOTE — Telephone Encounter (Signed)
-----   Message from Valentina Shaggy, MD sent at 04/20/2022  5:33 AM EDT ----- I want her to see Dr. Fredric Dine. She has seen the PA at the practice, but I want her to see an MD/DO for her dizziness and ear fullness. Can you call to make her an appointment? You know how [place] is.Marland KitchenMarland Kitchen

## 2022-04-30 NOTE — Telephone Encounter (Signed)
Angela Nevin can you help out with this one also.  Thank you

## 2022-05-04 NOTE — Telephone Encounter (Signed)
Thanks for taking care of this, Christina Esparza!   Salvatore Marvel, MD [facility]

## 2022-05-04 NOTE — Telephone Encounter (Signed)
Called Dr. Dorathy Kinsman office and scheduled patient for 07-13-2022 @ 9:10 am with Audiologist and 04-12-2022 ay 10:15 am with Dr. Fredric Dine.   Per office a referral did need to be placed due to diagnosis. Referral has been placed to Dr. Dorathy Kinsman office.   [facility] [place] [facility] [place], [place] [address] [phone] Joylene Igo)   Called and provided patient with dates of appointment. Confirmed with patient to go to same office as Pietro Cassis, PA. Patient verbalized understanding.

## 2022-05-16 ENCOUNTER — Ambulatory Visit (INDEPENDENT_AMBULATORY_CARE_PROVIDER_SITE_OTHER): Payer: Commercial Managed Care - PPO

## 2022-05-16 DIAGNOSIS — J4550 Severe persistent asthma, uncomplicated: Secondary | ICD-10-CM

## 2022-05-18 DIAGNOSIS — J4550 Severe persistent asthma, uncomplicated: Secondary | ICD-10-CM | POA: Diagnosis not present

## 2022-06-11 ENCOUNTER — Ambulatory Visit (INDEPENDENT_AMBULATORY_CARE_PROVIDER_SITE_OTHER): Payer: Commercial Managed Care - PPO

## 2022-06-11 DIAGNOSIS — J4550 Severe persistent asthma, uncomplicated: Secondary | ICD-10-CM | POA: Diagnosis not present

## 2022-06-22 ENCOUNTER — Ambulatory Visit (INDEPENDENT_AMBULATORY_CARE_PROVIDER_SITE_OTHER): Payer: Commercial Managed Care - PPO | Admitting: Family Medicine

## 2022-06-22 ENCOUNTER — Other Ambulatory Visit: Payer: Self-pay

## 2022-06-22 ENCOUNTER — Encounter: Payer: Self-pay | Admitting: Family Medicine

## 2022-06-22 DIAGNOSIS — J449 Chronic obstructive pulmonary disease, unspecified: Secondary | ICD-10-CM

## 2022-06-22 DIAGNOSIS — J3089 Other allergic rhinitis: Secondary | ICD-10-CM | POA: Diagnosis not present

## 2022-06-22 DIAGNOSIS — Z720 Tobacco use: Secondary | ICD-10-CM

## 2022-06-22 DIAGNOSIS — H6983 Other specified disorders of Eustachian tube, bilateral: Secondary | ICD-10-CM | POA: Diagnosis not present

## 2022-06-22 DIAGNOSIS — K219 Gastro-esophageal reflux disease without esophagitis: Secondary | ICD-10-CM

## 2022-06-22 DIAGNOSIS — J4551 Severe persistent asthma with (acute) exacerbation: Secondary | ICD-10-CM

## 2022-06-22 MED ORDER — PREDNISONE 10 MG PO TABS: ORAL_TABLET | ORAL | 0 refills | Status: DC

## 2022-06-22 NOTE — Patient Instructions (Addendum)
Asthma/COPD overlap syndrome Begin prednisone 10 mg tablets. Take 2 tablets twice a day for 3 days, then take 2 tablets once a day for 1 day, then take 1 tablet on the 5th day, then stop  Begin Breztri inhaler 2 puffs twice a day to prevent cough or wheeze.  Complete the application for free or reduced medication from [place] injections once every 4 weeks Continue albuterol 2 puffs once every 4 hours as needed for cough or wheeze You may use albuterol 2 puffs 5 to 15 minutes before activity to decrease cough or wheeze If your cough worsens we will move forward with chest x-ray. Call the clinic if your symptoms worsen or do not improve or if you develop a fever  Allergic rhinitis Continue allergen avoidance measures directed toward dog and cat as listed below Continue Allegra 180 mg once a day as needed for runny nose or itch Continue Xhance 2 sprays in each nostril up to twice a day for nasal congestion Consider saline nasal rinses as needed for nasal symptoms. Use this before any medicated nasal sprays for best result Restart your allergy injections when you are feeling better  Eustachian tube dysfunction Continue Xhance and nasal saline rinses as listed above  Reflux Continue dietary and lifestyle modifications as listed below Continue lansoprazole as previously prescribed  Tobacco use Smoking cessation written information provided from up-to-date Try to cut down and best to quit smoking  Call the clinic if this treatment plan is not working well for you.  Follow up in 1 month or sooner if needed.  Control of Dog or Cat Allergen Avoidance is the best way to manage a dog or cat allergy. If you have a dog or cat and are allergic to dog or cats, consider removing the dog or cat from the home. If you have a dog or cat but don't want to find it a new home, or if your family wants a pet even though someone in the household is allergic, here are some strategies that  may help keep symptoms at bay:  Keep the pet out of your bedroom and restrict it to only a few rooms. Be advised that keeping the dog or cat in only one room will not limit the allergens to that room. Don't pet, hug or kiss the dog or cat; if you do, wash your hands with soap and water. High-efficiency particulate air (HEPA) cleaners run continuously in a bedroom or living room can reduce allergen levels over time. Regular use of a high-efficiency vacuum cleaner or a central vacuum can reduce allergen levels. Giving your dog or cat a bath at least once a week can reduce airborne allergen.

## 2022-06-22 NOTE — Progress Notes (Signed)
RE: Christina Esparza MRN: [id number] DOB: 09/11/1961 Date of Telemedicine Visit: 06/22/2022  Referring provider: Redmond School, MD Primary care provider: Redmond School, MD  Chief Complaint: Asthma, Cough (For 2 weeks has had constant coughing and chest congestion with tightness. Stopped allergy shots a couple of months ago due to dizziness. ), Eczema (No issue ), and Urticaria (No issues )   Telemedicine Follow Up Visit via Telephone: I connected with Christina Esparza for a follow up on 06/24/22 by telephone and verified that I am speaking with the correct person using two identifiers.   I discussed the limitations, risks, security and privacy concerns of performing an evaluation and management service by telephone and the availability of in person appointments. I also discussed with the patient that there may be a patient responsible charge related to this service. The patient expressed understanding and agreed to proceed.  Patient is at home  Provider is at the office.  Visit start time: 50 Visit end time: [place] consent/check in by: [facility] consent and medical assistant/nurse: Diandra  History of Present Illness: She is a 61 y.o. 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 Her previous allergy office visit was on 04/18/2022 with Dr. Ernst Bowler.  At today's visit, she reports that she began to get experience chest tightness, shortness of breath with activity and rest, and cough producing clear mucus about 2 weeks ago.  She denies fever, sweats, chills, and sick contacts.  She continues Alvesco 80-1 puff once a day and albuterol 2-3 times a day with moderate relief of symptoms.  She had been using Spiriva, however, she has run out of this medication and reports that it is too expensive to purchase a refill.   She continues on Nucala injections.  She reports some moderate relief of asthma symptoms while continuing on Nucala. Allergic rhinitis is reported as moderately well controlled with symptoms including nasal congestion, sneezing, and copious postnasal drainage with frequent throat clearing.  She continues Allegra 180 mg once a day, occasional saline nasal rinses, and restarted Xhance nasal spray about 1 week ago.  Eustachian tube dysfunction is reported as poorly controlled with both ears feeling full with the left more than the right.  She reports occasional aching in both ears.  She denies any drainage from her ears or recent change in hearing.  Reflux is reported as moderately well controlled and she has just restarted lansoprazole.  She reports that she continues to smoke more than 1 pack of cigarettes a day.  Her current medications are listed in the chart.   Assessment and Plan: Christina Esparza is a 61 y.o. female with: Patient Instructions  Asthma/COPD overlap syndrome Begin prednisone 10 mg tablets. Take 2 tablets twice a day for 3 days, then take 2 tablets once a day for 1 day, then take 1 tablet on the 5th day, then stop  Begin Breztri inhaler 2 puffs twice a day to prevent cough or wheeze.  Complete the application for free or reduced medication from [place] injections once every 4 weeks Continue albuterol 2 puffs once every 4 hours as needed for cough or wheeze You may use albuterol 2 puffs 5 to 15 minutes before activity to decrease cough or wheeze If your cough worsens we will move forward with chest x-ray. Call the clinic if your symptoms worsen or do not improve or  if you develop a fever  Allergic rhinitis Continue allergen avoidance measures directed toward dog and cat as listed below Continue Allegra 180 mg once a day as needed for runny nose or itch Continue Xhance 2 sprays in each nostril up to twice a day for nasal congestion Consider saline nasal rinses as needed for  nasal symptoms. Use this before any medicated nasal sprays for best result Restart your allergy injections when you are feeling better  Eustachian tube dysfunction Continue Xhance and nasal saline rinses as listed above  Reflux Continue dietary and lifestyle modifications as listed below Continue lansoprazole as previously prescribed  Tobacco use Smoking cessation written information provided from up-to-date Try to cut down and best to quit smoking  Call the clinic if this treatment plan is not working well for you.  Follow up in 1 month or sooner if needed.  Return in about 4 weeks (around 07/20/2022), or if symptoms worsen or fail to improve.  Meds ordered this encounter  Medications   predniSONE (DELTASONE) 10 MG tablet    Sig: Begin prednisone 10 mg tablets. Take 2 tablets twice a day for 3 days, then take 2 tablets once a day for 1 day, then take 1 tablet on the 5th day, then stop    Dispense:  15 tablet    Refill:  0    Medication List:  Current Outpatient Medications  Medication Sig Dispense Refill   albuterol (PROVENTIL) 2 MG tablet Take 2 mg by mouth 3 (three) times daily.     ALPRAZolam (XANAX) 1 MG tablet Take 1 mg by mouth 3 (three) times daily as needed for anxiety.     ALVESCO 80 MCG/ACT inhaler Inhale 2 puffs into the lungs 2 (two) times daily. 6.1 g 5   azelastine (ASTELIN) 0.1 % nasal spray Place 1 spray into both nostrils 2 (two) times daily. Use in each nostril as directed 30 mL 5   DULoxetine (CYMBALTA) 30 MG capsule Take 30 mg by mouth daily.     DULoxetine (CYMBALTA) 60 MG capsule Take 60 mg by mouth daily.     EPINEPHrine (AUVI-Q) 0.3 mg/0.3 mL IJ SOAJ injection Inject 0.3 mg into the muscle as needed for anaphylaxis. 2 each 1   ferrous sulfate 325 (65 FE) MG tablet Take 325 mg by mouth daily with breakfast.     fluorometholone (FML) 0.1 % ophthalmic suspension 1 drop 4 (four) times daily.     Fluticasone Propionate (XHANCE) 93 MCG/ACT EXHU Place 1 spray  into the nose daily. 16 mL 5   hydrOXYzine (ATARAX/VISTARIL) 25 MG tablet TAKE 1 TABLET(25 MG) BY MOUTH THREE TIMES DAILY AS NEEDED FOR ANXIETY OR ALLERGIES 60 tablet 3   meclizine (ANTIVERT) 25 MG tablet Take by mouth.     omeprazole (PRILOSEC) 40 MG capsule Take 1 capsule (40 mg total) by mouth 2 (two) times daily before a meal. 90 capsule 3   ondansetron (ZOFRAN) 4 MG tablet Take 4 mg by mouth 4 (four) times daily as needed.     predniSONE (DELTASONE) 10 MG tablet Begin prednisone 10 mg tablets. Take 2 tablets twice a day for 3 days, then take 2 tablets once a day for 1 day, then take 1 tablet on the 5th day, then stop 15 tablet 0   SUMAtriptan (IMITREX) 50 MG tablet Take by mouth.     Tiotropium Bromide Monohydrate (SPIRIVA RESPIMAT) 1.25 MCG/ACT AERS Inhale 2 puffs into the lungs daily. 4 g 3   famotidine (PEPCID) 40  MG tablet Take 1 tablet (40 mg total) by mouth daily. 30 tablet 5   formoterol (PERFOROMIST) 20 MCG/2ML nebulizer solution Take 2 mLs (20 mcg total) by nebulization 2 (two) times daily. 120 mL 5   Current Facility-Administered Medications  Medication Dose Route Frequency Provider Last Rate Last Admin   mepolizumab (NUCALA) injection 100 mg  100 mg Subcutaneous Q28 days Valentina Shaggy, MD   100 mg at 06/11/22 1349   Allergies: Allergies  Allergen Reactions   Omeprazole Other (See Comments)   Amitiza [Lubiprostone] Itching    Patient states that she experienced a pin sticking sensation, prickling all over.   Linaclotide Nausea Only   Cat Hair Extract    Ceftin [Cefuroxime] Itching   Codeine Nausea And Vomiting   Levaquin [Levofloxacin In D5w]     dream   Penicillin G    Penicillins Other (See Comments)    Felt like she was choking Did it involve swelling of the face/tongue/throat, SOB, or low BP? Unknown Did it involve sudden or severe rash/hives, skin peeling, or any reaction on the inside of your mouth or nose? No Did you need to seek medical attention at a  hospital or doctor's office? No When did it last happen?~10 years ago If all above answers are "NO", may proceed with cephalosporin use.  Felt like she was choking Felt like she was choking Did it involve swelling of the face/tongue/throat, SOB, or low BP? Unknown Did it involve sudden or severe rash/hives, skin peeling, or any reaction on the inside of your mouth or nose? No Did you need to seek medical attention at a hospital or doctor's office? No When did it last happen?~10 years ago If all above answers are "NO", may proceed with cephalosporin use.    I reviewed her past medical history, social history, family history, and environmental history and no significant changes have been reported from previous visit on 04/18/2022.  Objective: Physical Exam Not obtained as encounter was done via telephone.   Previous notes and tests were reviewed.  I discussed the assessment and treatment plan with the patient. The patient was provided an opportunity to ask questions and all were answered. The patient agreed with the plan and demonstrated an understanding of the instructions.   The patient was advised to call back or seek an in-person evaluation if the symptoms worsen or if the condition fails to improve as anticipated.  I provided 41 minutes of non-face-to-face time during this encounter.  It was my pleasure to participate in [place] Cassell's care today. Please feel free to contact me with any questions or concerns.   Sincerely,  Gareth Morgan, FNP

## 2022-06-24 ENCOUNTER — Encounter: Payer: Self-pay | Admitting: Family Medicine

## 2022-06-24 DIAGNOSIS — H6983 Other specified disorders of Eustachian tube, bilateral: Secondary | ICD-10-CM | POA: Insufficient documentation

## 2022-06-24 DIAGNOSIS — J4551 Severe persistent asthma with (acute) exacerbation: Secondary | ICD-10-CM | POA: Insufficient documentation

## 2022-06-24 DIAGNOSIS — Z720 Tobacco use: Secondary | ICD-10-CM | POA: Insufficient documentation

## 2022-06-24 DIAGNOSIS — J3089 Other allergic rhinitis: Secondary | ICD-10-CM | POA: Insufficient documentation

## 2022-06-29 ENCOUNTER — Telehealth: Payer: Self-pay

## 2022-06-29 MED ORDER — BREZTRI AEROSPHERE 160-9-4.8 MCG/ACT IN AERO: 2.0000 | INHALATION_SPRAY | Freq: Two times a day (BID) | RESPIRATORY_TRACT | 11 refills | Status: DC

## 2022-06-29 NOTE — Addendum Note (Signed)
Addended by: Clovis Cao A on: 06/29/2022 04:29 PM   Modules accepted: Orders

## 2022-06-29 NOTE — Telephone Encounter (Signed)
Patient came in today and dropped off her paperwork that she completed for [employer]. I reviewed the paperwork and had Dr. Ernst Bowler sign the prescriptions and well as the application. I have sent the papers off to [employer] at [phone].  I have also sent a copy of the completed application the patients home via mail.

## 2022-07-05 NOTE — Telephone Encounter (Signed)
Received faxed Denial letter from [place] [employer] regarding patient's [facility] application.   Denial Date: 07/03/22 Denial Reason: Insurance Eligibility Criteria not met - To qualify, patient must not be receiving coverage under private/commercial insurance or a government program other than Medicare.  Forwarding message to provider for next step.

## 2022-07-11 ENCOUNTER — Other Ambulatory Visit: Payer: Self-pay | Admitting: *Deleted

## 2022-07-11 MED ORDER — BREZTRI AEROSPHERE 160-9-4.8 MCG/ACT IN AERO: 2.0000 | INHALATION_SPRAY | Freq: Two times a day (BID) | RESPIRATORY_TRACT | 5 refills | Status: DC

## 2022-07-11 NOTE — Telephone Encounter (Signed)
I misread that. Sorry!   Does the copay card not do anything?   Salvatore Marvel, MD [facility] [place] [facility] [place]

## 2022-07-11 NOTE — Telephone Encounter (Signed)
I talked to Christina Esparza and she confirmed that [place] [employer] covers uninsured patient.   Salvatore Marvel, MD [facility] [place] [facility] [place]

## 2022-07-11 NOTE — Telephone Encounter (Signed)
Could she get Breztri using GoodRx?

## 2022-07-11 NOTE — Telephone Encounter (Signed)
The patient is insured and has [employer], that's why [employer] denied her application.

## 2022-07-11 NOTE — Telephone Encounter (Signed)
Sent in prescription and attached Copay card. Called patient and advised of application denial and prescription being sent in with copay card. Patient verbalized understanding and will call back if she needs anything further.

## 2022-07-18 ENCOUNTER — Encounter: Payer: Self-pay | Admitting: Allergy & Immunology

## 2022-07-18 ENCOUNTER — Ambulatory Visit: Payer: Commercial Managed Care - PPO

## 2022-07-18 ENCOUNTER — Ambulatory Visit (INDEPENDENT_AMBULATORY_CARE_PROVIDER_SITE_OTHER): Payer: Commercial Managed Care - PPO | Admitting: Allergy & Immunology

## 2022-07-18 VITALS — BP 118/80 | HR 98 | Temp 97.8°F | Resp 16 | Ht 59.5 in | Wt 108.4 lb

## 2022-07-18 DIAGNOSIS — J4550 Severe persistent asthma, uncomplicated: Secondary | ICD-10-CM | POA: Diagnosis not present

## 2022-07-18 DIAGNOSIS — J331 Polypoid sinus degeneration: Secondary | ICD-10-CM

## 2022-07-18 DIAGNOSIS — J449 Chronic obstructive pulmonary disease, unspecified: Secondary | ICD-10-CM | POA: Diagnosis not present

## 2022-07-18 DIAGNOSIS — Z720 Tobacco use: Secondary | ICD-10-CM

## 2022-07-18 DIAGNOSIS — K219 Gastro-esophageal reflux disease without esophagitis: Secondary | ICD-10-CM

## 2022-07-18 DIAGNOSIS — H6983 Other specified disorders of Eustachian tube, bilateral: Secondary | ICD-10-CM

## 2022-07-18 DIAGNOSIS — J4551 Severe persistent asthma with (acute) exacerbation: Secondary | ICD-10-CM

## 2022-07-18 NOTE — Patient Instructions (Addendum)
1. COPD with asthma overlap syndrome - Lung function looks stable. - We will continue with Nucala monthly. - As a reminder: you had the genetic changes that make you predisposed to complications from alpha one antitrypsin deficiency - This is made even worse by smoking since the cigarette smoke deactivates the enzyme that is working in your lungs.  - It looks like the [employer] copay card worked!  - Daily controller medication(s): Breztri two puffs twice daily as you are doing - Prior to physical activity: albuterol 2 puffs 10-15 minutes before physical activity. - Rescue medications: albuterol 4 puffs every 4-6 hours as needed - Asthma control goals:  * Full participation in all desired activities (may need albuterol before activity) * Albuterol use two time or less a week on average (not counting use with activity) * Cough interfering with sleep two time or less a month * Oral steroids no more than once a year * No hospitalizations  2. Perennial allergic rhinitis (cat, dog) - Continue with Xhance one spray per nostril daily. - Let's restart the allergy shots (come back on Friday to restart them).  - Continue with: nasal saline rinses  - Continue with: Allegra 180 mg once a day as needed for runny nose/itching - Continue with: Astelin twice daily as needed  3. Social stressors  - I would try [employer] or [employer] to see if you can get a Therapist, art.  - I would consider getting a restraining order against your brother-in-law.  - This is clearly affecting your health and I am worried about you.   4. Return in about 4 months (around 11/17/2022).    Please inform us of any Emergency Department visits, hospitalizations, or changes in symptoms. Call us before going to the ED for breathing or allergy symptoms since we might be able to fit you in for a sick visit. Feel free to contact us anytime with any questions, problems, or concerns.  It was a pleasure to see you again  today!  Websites that have reliable patient information: 1. American Academy of Asthma, Allergy, and Immunology: www.aaaai.org 2. Food Allergy Research and Education (FARE): foodallergy.org 3. Mothers of Asthmatics: http://www.asthmacommunitynetwork.org 4. American College of Allergy, Asthma, and Immunology: www.acaai.org   COVID-19 Vaccine Information can be found at: ShippingScam.co.uk For questions related to vaccine distribution or appointments, please email [email or web address] or call [phone].   We realize that you might be concerned about having an allergic reaction to the COVID19 vaccines. To help with that concern, WE ARE OFFERING THE COVID19 VACCINES IN OUR OFFICE! Ask the front desk for dates!     "Like" [place] on Facebook and Instagram for our latest updates!      A healthy democracy works best when [employer] participate! Make sure you are registered to vote! If you have moved or changed any of your contact information, you will need to get this updated before voting!  In some cases, you MAY be able to register to vote online: [email or web address]

## 2022-07-18 NOTE — Progress Notes (Signed)
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 COPD with asthma overlap syndrome - Lung function looks stable. - We will continue with Nucala monthly. - As a reminder: you had the genetic changes that make you predisposed to complications from alpha one antitrypsin deficiency - This is made even worse by smoking since the cigarette smoke deactivates the enzyme that is working in your lungs.  - The insurance company is never going to approve you for the enzyme replacement unless you stop smoking. - Smoking cessation discussed today. - It looks like the [employer] copay card worked!  - Daily controller medication(s): Breztri two puffs twice daily as you are doing - Prior to physical activity: albuterol 2 puffs 10-15 minutes before physical activity. - Rescue medications: albuterol 4 puffs every 4-6 hours as needed - Asthma control goals:  * Full participation in all desired activities (may need albuterol before activity) * Albuterol use two time or less a week on average (not counting use with activity) * Cough interfering with sleep two time or less a month * Oral steroids no more than once a year * No hospitalizations  2. Perennial allergic rhinitis (cat, dog) - Continue with Xhance one spray per nostril daily. - Let's restart the allergy shots (come back on Friday to restart them).  - Continue with: nasal saline rinses  -  Continue with: Allegra 180 mg once a day as needed for runny nose/itching - Continue with: Astelin twice daily as needed  3. Social stressors  - I would try [facility] or [facility] to see if you can get a Therapist, art.  - I would consider getting a restraining order against your brother-in-law.  - This is clearly affecting your health and I am worried about you.   4. Return in about 4 months (around 11/17/2022).    Subjective:   Christina Esparza is a 61 y.o. female presenting today for follow up of  Chief Complaint  Patient presents with   Asthma    Is very stressed and feels like she is breathing through her mouth. Can't relax for nothing    Allergic Rhinitis     Eyes feel blurry all the time and watery     [name] has a history of the following: Patient Active Problem List   Diagnosis Date Noted   Severe persistent asthma with acute exacerbation 06/24/2022   Dysfunction of both eustachian tubes 06/24/2022   Perennial allergic rhinitis 06/24/2022   Tobacco use 06/24/2022   Low back pain 04/05/2021   Peroneal neuropathy, right 04/05/2021   Carpal tunnel syndrome on right 03/23/2021   Primary osteoarthritis of both first carpometacarpal joints 03/23/2021   Right wrist pain 03/23/2021   Pain in right hip 12/28/2020   Asthma-COPD overlap syndrome ([place]) 09/14/2020   Osteoarthritis cervical spine 05/04/2020   Gastroesophageal reflux disease 10/22/2019   GERD (gastroesophageal reflux disease) 10/21/2019  Shortness of breath 10/21/2019   Primary osteoarthritis of first carpometacarpal joint of right hand 09/17/2017   Anxiety state 08/24/2015    History obtained from: chart review and patient.  Christina Esparza is a 61 y.o. female presenting for a follow up visit.  She was last seen in August 2023.  At that time, the nurse practitioner started her on a prednisone taper.  She was also started on Breztri 2 puffs twice daily and continued on Nucala monthly.  For her allergic  rhinitis, she was continued on Allegra as well as Xhance.  She had been on allergen immunotherapy at some point, but she stopped this because she felt it was making her feel worse.  For her reflux, she was continued on lansoprazole.  Tobacco cessation was discussed.  We attempted to get free drug through [place] [facility] [place], however this was denied because she has Pharmacist, community (and [place] and [place] only covers Medicare and uninsured patients).   Since the last visit, she has mostly done well. Her dog is doing fine, but her husband has been out of town for a week at a time working at [employer]. Thankfully, he is not drinking the water. The cat is doing fine. The dog sheds a lot which she thinks is causing her to have issues.   Asthma/Respiratory Symptom History: She is using the Breztri two puffs twice daily. She thinks that this might be providing some relief.  Thankfully, it looks like the co-pay card for the Judithann Sauger is family work.  She has $0 listed as a cost on her pharmacy app.  Over the last couple of months, she has been breathing in and out of deeply. She thinks that this might be related to stress. She has been having some problems with her mother's will (who will be dead now two years coming up in a few days). Her sister is not talking to her and her brother-in-law is being a jerk. She is thinking of this morning to night.   She is smoking a little over one pack per day. She has never been interested in patches. She knows that she needs to stop.  We talked about the options today including the patch versus Chantix.  She think she has more likely to use the Chantix.  Allergic Rhinitis Symptom History: She continues to have the cat and the dog.  The dog is very cute.  She did show [place] pictures of the dog during the visit.  Regardless, she thinks that the cat and the dog certainly do contribute to her symptoms, especially her conjunctivitis.  We gave her eyedrops in the last visit, but they were over $300.   She did not get these.  She thinks she might want to restart allergy shots.  She did feel better from a conjunctivitis standpoint especially when she was on the shots. She is having a lot of problems with ocular discharge. Dizziness has improved overall.   She saw ENT in [place] and was told that she was fine. A CT scan apparently was normal.  She received a phone call saying it was normal and to follow-up as needed.  She was very happy with that report and the weight was handled.  She was going to see the person we recommended, but they cannot see her for several months.    GERD Symptom History: She was on omeprazole but no longer takes this.  She has Tums to take as needed.  She is going up to Cymbalta up  to 120 mg. Her Xanax is not working now either.  She has had a counselor in the past, but apparently they were not billing the insurance correctly and she was paying a lot of money out of pocket.  She has not tried any virtual counseling services such as [facility].  She really just think she needs someone to talk to.  Otherwise, there have been no changes to her past medical history, surgical history, family history, or social history.    Review of Systems  Constitutional: Negative.  Negative for chills, fever, malaise/fatigue and weight loss.  HENT: Negative.  Negative for congestion, ear discharge, ear pain and sinus pain.   Eyes:  Negative for pain, discharge and redness.  Respiratory:  Positive for shortness of breath. Negative for cough, sputum production and wheezing.   Cardiovascular: Negative.  Negative for chest pain and palpitations.  Gastrointestinal:  Negative for abdominal pain, constipation, diarrhea, heartburn, nausea and vomiting.  Skin: Negative.  Negative for itching and rash.  Neurological:  Negative for dizziness and headaches.  Endo/Heme/Allergies:  Negative for environmental allergies. Does not bruise/bleed easily.       Objective:   Blood pressure 118/80,  pulse 98, temperature 97.8 F (36.6 C), resp. rate 16, height 4' 11.5" (1.511 m), weight 108 lb 6 oz (49.2 kg), SpO2 96 %. Body mass index is 21.52 kg/m.    Physical Exam Vitals reviewed.  Constitutional:      Appearance: She is well-developed.     Comments: Anxious.  But less tearful than normal.  HENT:     Head: Normocephalic and atraumatic.     Right Ear: Tympanic membrane, ear canal and external ear normal.     Left Ear: Tympanic membrane, ear canal and external ear normal.     Nose: No nasal deformity, septal deviation, mucosal edema or rhinorrhea.     Right Turbinates: Enlarged, swollen and pale.     Left Turbinates: Enlarged, swollen and pale.     Right Sinus: No maxillary sinus tenderness or frontal sinus tenderness.     Left Sinus: No maxillary sinus tenderness or frontal sinus tenderness.     Comments: Scant clear rhinorrhea.. No polyps.    Mouth/Throat:     Mouth: Mucous membranes are not pale and not dry.     Pharynx: Uvula midline.  Eyes:     General: Lids are normal. Allergic shiner present.        Right eye: No discharge.        Left eye: No discharge.     Conjunctiva/sclera: Conjunctivae normal.     Right eye: Right conjunctiva is not injected. No chemosis.    Left eye: Left conjunctiva is not injected. No chemosis.    Pupils: Pupils are equal, round, and reactive to light.  Cardiovascular:     Rate and Rhythm: Normal rate and regular rhythm.     Heart sounds: Normal heart sounds.  Pulmonary:     Effort: Pulmonary effort is normal. No tachypnea, accessory muscle usage or respiratory distress.     Breath sounds: Normal breath sounds. No wheezing, rhonchi or rales.     Comments: Moving air well in upper fields.  No increased work of breathing. Chest:     Chest wall: No tenderness.  Lymphadenopathy:     Cervical: No cervical adenopathy.  Skin:    General: Skin is warm.     Capillary Refill: Capillary refill takes less than 2 seconds.     Coloration: Skin is  not pale.     Findings: No abrasion, erythema, petechiae or rash. Rash is not papular, urticarial or vesicular.  Neurological:     Mental Status: She is alert.  Psychiatric:        Behavior: Behavior is cooperative.      Diagnostic studies:    Spirometry: results abnormal (FEV1: 1.20/57%, FVC: 2.08/78%, FEV1/FVC: 58%).    Spirometry consistent with moderate obstructive disease.  Overall, values are stable.  Allergy Studies: none        Salvatore Marvel, MD  [facility]

## 2022-07-20 ENCOUNTER — Ambulatory Visit (INDEPENDENT_AMBULATORY_CARE_PROVIDER_SITE_OTHER): Payer: Commercial Managed Care - PPO

## 2022-07-20 ENCOUNTER — Encounter: Payer: Self-pay | Admitting: Allergy & Immunology

## 2022-07-20 DIAGNOSIS — J309 Allergic rhinitis, unspecified: Secondary | ICD-10-CM

## 2022-07-27 ENCOUNTER — Ambulatory Visit (INDEPENDENT_AMBULATORY_CARE_PROVIDER_SITE_OTHER): Payer: Commercial Managed Care - PPO

## 2022-07-27 DIAGNOSIS — J309 Allergic rhinitis, unspecified: Secondary | ICD-10-CM

## 2022-08-03 ENCOUNTER — Ambulatory Visit (INDEPENDENT_AMBULATORY_CARE_PROVIDER_SITE_OTHER): Payer: Commercial Managed Care - PPO

## 2022-08-03 DIAGNOSIS — J309 Allergic rhinitis, unspecified: Secondary | ICD-10-CM | POA: Diagnosis not present

## 2022-08-10 ENCOUNTER — Ambulatory Visit (INDEPENDENT_AMBULATORY_CARE_PROVIDER_SITE_OTHER): Payer: Commercial Managed Care - PPO

## 2022-08-10 DIAGNOSIS — J309 Allergic rhinitis, unspecified: Secondary | ICD-10-CM

## 2022-08-13 ENCOUNTER — Ambulatory Visit (INDEPENDENT_AMBULATORY_CARE_PROVIDER_SITE_OTHER): Payer: Commercial Managed Care - PPO

## 2022-08-13 DIAGNOSIS — J4550 Severe persistent asthma, uncomplicated: Secondary | ICD-10-CM | POA: Diagnosis not present

## 2022-08-17 ENCOUNTER — Ambulatory Visit (INDEPENDENT_AMBULATORY_CARE_PROVIDER_SITE_OTHER): Payer: Commercial Managed Care - PPO

## 2022-08-17 DIAGNOSIS — J309 Allergic rhinitis, unspecified: Secondary | ICD-10-CM | POA: Diagnosis not present

## 2022-08-24 ENCOUNTER — Ambulatory Visit (INDEPENDENT_AMBULATORY_CARE_PROVIDER_SITE_OTHER): Payer: Commercial Managed Care - PPO

## 2022-08-24 DIAGNOSIS — J309 Allergic rhinitis, unspecified: Secondary | ICD-10-CM | POA: Diagnosis not present

## 2022-08-29 ENCOUNTER — Ambulatory Visit (INDEPENDENT_AMBULATORY_CARE_PROVIDER_SITE_OTHER): Payer: Commercial Managed Care - PPO | Admitting: *Deleted

## 2022-08-29 DIAGNOSIS — J309 Allergic rhinitis, unspecified: Secondary | ICD-10-CM

## 2022-09-07 ENCOUNTER — Ambulatory Visit (INDEPENDENT_AMBULATORY_CARE_PROVIDER_SITE_OTHER): Payer: Commercial Managed Care - PPO

## 2022-09-07 DIAGNOSIS — J309 Allergic rhinitis, unspecified: Secondary | ICD-10-CM

## 2022-09-10 ENCOUNTER — Ambulatory Visit (INDEPENDENT_AMBULATORY_CARE_PROVIDER_SITE_OTHER): Payer: Commercial Managed Care - PPO | Admitting: *Deleted

## 2022-09-10 DIAGNOSIS — J4550 Severe persistent asthma, uncomplicated: Secondary | ICD-10-CM

## 2022-09-14 ENCOUNTER — Ambulatory Visit (INDEPENDENT_AMBULATORY_CARE_PROVIDER_SITE_OTHER): Payer: Commercial Managed Care - PPO

## 2022-09-14 DIAGNOSIS — J309 Allergic rhinitis, unspecified: Secondary | ICD-10-CM

## 2022-10-05 ENCOUNTER — Ambulatory Visit (INDEPENDENT_AMBULATORY_CARE_PROVIDER_SITE_OTHER): Payer: Commercial Managed Care - PPO | Admitting: *Deleted

## 2022-10-05 DIAGNOSIS — J309 Allergic rhinitis, unspecified: Secondary | ICD-10-CM

## 2022-10-07 ENCOUNTER — Encounter (INDEPENDENT_AMBULATORY_CARE_PROVIDER_SITE_OTHER): Payer: Self-pay | Admitting: Gastroenterology

## 2022-10-08 ENCOUNTER — Ambulatory Visit (INDEPENDENT_AMBULATORY_CARE_PROVIDER_SITE_OTHER): Payer: Commercial Managed Care - PPO

## 2022-10-08 DIAGNOSIS — J4550 Severe persistent asthma, uncomplicated: Secondary | ICD-10-CM | POA: Diagnosis not present

## 2022-11-16 ENCOUNTER — Ambulatory Visit: Payer: Commercial Managed Care - PPO

## 2022-11-16 ENCOUNTER — Ambulatory Visit (INDEPENDENT_AMBULATORY_CARE_PROVIDER_SITE_OTHER): Payer: Commercial Managed Care - PPO | Admitting: Allergy & Immunology

## 2022-11-16 ENCOUNTER — Other Ambulatory Visit: Payer: Self-pay

## 2022-11-16 ENCOUNTER — Encounter: Payer: Self-pay | Admitting: Allergy & Immunology

## 2022-11-16 VITALS — BP 122/70 | HR 89 | Temp 98.0°F | Resp 20 | Ht 61.0 in | Wt 106.0 lb

## 2022-11-16 DIAGNOSIS — J3089 Other allergic rhinitis: Secondary | ICD-10-CM | POA: Diagnosis not present

## 2022-11-16 DIAGNOSIS — J331 Polypoid sinus degeneration: Secondary | ICD-10-CM | POA: Diagnosis not present

## 2022-11-16 DIAGNOSIS — J4550 Severe persistent asthma, uncomplicated: Secondary | ICD-10-CM

## 2022-11-16 DIAGNOSIS — Z720 Tobacco use: Secondary | ICD-10-CM

## 2022-11-16 DIAGNOSIS — K219 Gastro-esophageal reflux disease without esophagitis: Secondary | ICD-10-CM

## 2022-11-16 MED ORDER — VENTOLIN HFA 108 (90 BASE) MCG/ACT IN AERS
2.0000 | INHALATION_SPRAY | RESPIRATORY_TRACT | 1 refills | Status: DC | PRN
Start: 2022-11-16 — End: 2023-03-20

## 2022-11-16 MED ORDER — BREZTRI AEROSPHERE 160-9-4.8 MCG/ACT IN AERO: 2.0000 | INHALATION_SPRAY | Freq: Two times a day (BID) | RESPIRATORY_TRACT | 5 refills | Status: DC

## 2022-11-16 MED ORDER — CROMOLYN SODIUM 4 % OP SOLN: 2.0000 [drp] | Freq: Four times a day (QID) | OPHTHALMIC | 12 refills | Status: DC

## 2022-11-16 NOTE — Patient Instructions (Addendum)
1. COPD with asthma overlap syndrome - Lung function looks a bit better and is near your personal best!  - We will continue with Nucala monthly. - As a reminder: you had the genetic changes that make you predisposed to complications from alpha one antitrypsin deficiency - This is made even worse by smoking since the cigarette smoke deactivates the enzyme that is working in your lungs.  - Daily controller medication(s): Breztri two puffs TWICE daily with spacer - Prior to physical activity: albuterol 2 puffs 10-15 minutes before physical activity. - Rescue medications: albuterol 4 puffs every 4-6 hours as needed - Asthma control goals:  * Full participation in all desired activities (may need albuterol before activity) * Albuterol use two time or less a week on average (not counting use with activity) * Cough interfering with sleep two time or less a month * Oral steroids no more than once a year * No hospitalizations  2. Perennial allergic rhinitis (cat, dog) - Continue with Xhance one spray per nostril daily. - We will stop the allergy shots since you never felt that they did anything. - Continue with: nasal saline rinses  - Continue with: Allegra 180 mg once a day as needed for runny nose/itching - Continue with: Astelin twice daily as needed  3. Social stressors  - I am glad that you are getting some counseling help.   4. Return in about 4 months (around 03/18/2023).    Please inform us of any Emergency Department visits, hospitalizations, or changes in symptoms. Call us before going to the ED for breathing or allergy symptoms since we might be able to fit you in for a sick visit. Feel free to contact us anytime with any questions, problems, or concerns.  It was a pleasure to see you again today!  Websites that have reliable patient information: 1. American Academy of Asthma, Allergy, and Immunology: www.aaaai.org 2. Food Allergy Research and Education (FARE): foodallergy.org 3.  Mothers of Asthmatics: http://www.asthmacommunitynetwork.org 4. American College of Allergy, Asthma, and Immunology: www.acaai.org   COVID-19 Vaccine Information can be found at: ShippingScam.co.uk For questions related to vaccine distribution or appointments, please email [email or web address] or call [phone].   We realize that you might be concerned about having an allergic reaction to the COVID19 vaccines. To help with that concern, WE ARE OFFERING THE COVID19 VACCINES IN OUR OFFICE! Ask the front desk for dates!     "Like" [place] on Facebook and Instagram for our latest updates!      A healthy democracy works best when [employer] participate! Make sure you are registered to vote! If you have moved or changed any of your contact information, you will need to get this updated before voting!  In some cases, you MAY be able to register to vote online: [email or web address]

## 2022-11-16 NOTE — Progress Notes (Signed)
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 COPD with asthma overlap syndrome - Lung function looks a bit better and is near your personal best!  - We will continue with Nucala monthly. - As a reminder: you had the genetic changes that make you predisposed to complications from alpha one antitrypsin deficiency - This is made even worse by smoking since the cigarette smoke deactivates the enzyme that is working in your lungs.  - Daily controller medication(s): Breztri two puffs TWICE daily with spacer - Prior to physical activity: albuterol 2 puffs 10-15 minutes before physical activity. - Rescue medications: albuterol 4 puffs every 4-6 hours as needed - Asthma control goals:  * Full participation in all desired activities (may need albuterol before activity) * Albuterol use two time or less a week on average (not counting use with activity) * Cough interfering with sleep two time or less a month * Oral steroids no more than once a year * No hospitalizations  2. Perennial allergic rhinitis (cat, dog) - Continue with Xhance one spray per nostril daily. - We will stop the allergy shots since you never felt that they did anything. - Continue with: nasal saline rinses  - Continue with: Allegra 180 mg once a day as needed for runny nose/itching - Continue with: Astelin twice daily as needed  3. Social stressors  - I am glad that you are getting some counseling help.   4. Return in about 4 months (around 03/18/2023).    Subjective:   Christina Esparza is a  61 y.o. female presenting today for follow up of  Chief Complaint  Patient presents with   Follow-up    Follow up and get injection today.    Christina Esparza has a history of the following: Patient Active Problem List   Diagnosis Date Noted   Severe persistent asthma with acute exacerbation 06/24/2022   Dysfunction of both eustachian tubes 06/24/2022   Perennial allergic rhinitis 06/24/2022   Tobacco use 06/24/2022   Low back pain 04/05/2021   Peroneal neuropathy, right 04/05/2021   Carpal tunnel syndrome on right 03/23/2021   Primary osteoarthritis of both first carpometacarpal joints 03/23/2021   Right wrist pain 03/23/2021   Pain in right hip 12/28/2020   Asthma-COPD overlap syndrome 09/14/2020   Osteoarthritis cervical spine 05/04/2020   Gastroesophageal reflux disease 10/22/2019   GERD (gastroesophageal reflux disease) 10/21/2019   Shortness of breath 10/21/2019   Primary osteoarthritis of first carpometacarpal joint of right hand 09/17/2017   Anxiety state 08/24/2015    History obtained from: chart review and patient.  Christina Esparza is a 61 y.o. female presenting for a follow up visit.  She was last seen in August 2023.  At that time, her lung function looks stable.  We continue with mepolizumab monthly.  We continue with Breztri 2 puffs twice daily as well as albuterol as needed.  For allergic rhinitis, we continue with Xhance as well as nasal saline rinses and Allegra.  We also continue with Astelin.  Since last visit, she has not done  too well. Her anxiety turned into full blown panic attacks. She found a Veterinary surgeon and therapist in [place] at [facility] [place]. She has been taken off her medication since February since it did not seem to help at all. She is now being weaned off of that and she is starting on something different. She thinks that her sister and the stress from the last couple of years came to a head. Her brother-in-law finally stopped harassing her. They had to get  some lawyers involved.   Asthma/Respiratory Symptom History: She is getting her Breztri two puffs twice daily. She is getting this for free and she is happy with this. She is on Nucala monthly. She has been getting this routinely.  She has been using two puffs once daily of the Breztri until she checked her instructions and it was two puffs twice daily. She is happy with how she is feeling with regards to her asthma. She was using her albuterol frequently, but she thinks that this might decrease now that she is using the Breztri two puffs BID.   She is wondering whether she has fatigue form the Nucala. She reports that she has been having a lot of sleepiness, although she is not sure that this is related to her Nucala injections at all. It is hard for her to tell.   Allergic Rhinitis Symptom History: She has been using her Allegra daily. She is open to stopping the allergy shots for the dog and cat. She is not sure that it is doing much anyway. She does have nasal saline rinses to use as needed.   She has a ridght sided ankle lesions from a leash episode. Her dog apparently had a ton of energy and went running when the leash was wrapped around her leg.   Otherwise, there have been no changes to her past medical history, surgical history, family history, or social history.    Review of Systems  Constitutional: Negative.  Negative for chills, fever, malaise/fatigue and weight loss.  HENT: Negative.  Negative for congestion, ear discharge, ear pain and sinus pain.   Eyes:  Negative for pain, discharge and redness.  Respiratory:  Negative for cough, sputum production, shortness of breath and wheezing.   Cardiovascular: Negative.  Negative for chest pain and palpitations.  Gastrointestinal:  Negative for abdominal pain, constipation, diarrhea, heartburn, nausea and vomiting.  Skin: Negative.  Negative for itching and rash.  Neurological:  Negative for dizziness and headaches.  Endo/Heme/Allergies:   Negative for environmental allergies. Does not bruise/bleed easily.       Objective:   Blood pressure 122/70, pulse 89, temperature 98 F (36.7 C), resp. rate 20, height 5\' 1"  (1.549 m), weight 106 lb (48.1 kg), SpO2 98 %. Body mass index is 20.03 kg/m.    Physical Exam Vitals reviewed.  Constitutional:      Appearance: She is well-developed.     Comments: Anxious.  But less tearful than normal.  HENT:     Head: Normocephalic and atraumatic.     Right Ear: Tympanic membrane, ear canal and external ear normal.     Left Ear: Tympanic membrane, ear canal and external ear normal.     Nose: No nasal deformity, septal deviation, mucosal edema or rhinorrhea.     Right Turbinates: Enlarged, swollen and pale.     Left Turbinates: Enlarged, swollen and pale.     Right Sinus: No maxillary sinus tenderness or frontal sinus tenderness.     Left Sinus: No maxillary  sinus tenderness or frontal sinus tenderness.     Comments: Scant clear rhinorrhea.. No polyps.    Mouth/Throat:     Mouth: Mucous membranes are not pale and not dry.     Pharynx: Uvula midline.  Eyes:     General: Lids are normal. Allergic shiner present.        Right eye: No discharge.        Left eye: No discharge.     Conjunctiva/sclera: Conjunctivae normal.     Right eye: Right conjunctiva is not injected. No chemosis.    Left eye: Left conjunctiva is not injected. No chemosis.    Pupils: Pupils are equal, round, and reactive to light.  Cardiovascular:     Rate and Rhythm: Normal rate and regular rhythm.     Heart sounds: Normal heart sounds.  Pulmonary:     Effort: Pulmonary effort is normal. No tachypnea, accessory muscle usage or respiratory distress.     Breath sounds: Normal breath sounds. No wheezing, rhonchi or rales.     Comments: Moving air well in upper fields.  No increased work of breathing. Chest:     Chest wall: No tenderness.  Lymphadenopathy:     Cervical: No cervical adenopathy.  Skin:     General: Skin is warm.     Capillary Refill: Capillary refill takes less than 2 seconds.     Coloration: Skin is not pale.     Findings: No abrasion, erythema, petechiae or rash. Rash is not papular, urticarial or vesicular.  Neurological:     Mental Status: She is alert.  Psychiatric:        Behavior: Behavior is cooperative.      Diagnostic studies:    Spirometry: results abnormal (FEV1: 1.15/55%, FVC: 2.18/82%, FEV1/FVC: 53%).    Spirometry consistent with moderate obstructive disease. Xopenex four puffs via MDI treatment given in clinic with improvement in FEV1 and FVC, but not significant per ATS criteria.  Allergy Studies: none       Malachi Bonds, MD  [facility]

## 2022-12-17 ENCOUNTER — Ambulatory Visit: Payer: Commercial Managed Care - PPO

## 2022-12-21 ENCOUNTER — Ambulatory Visit (INDEPENDENT_AMBULATORY_CARE_PROVIDER_SITE_OTHER): Payer: Commercial Managed Care - PPO

## 2022-12-21 DIAGNOSIS — J4550 Severe persistent asthma, uncomplicated: Secondary | ICD-10-CM | POA: Diagnosis not present

## 2023-01-21 ENCOUNTER — Ambulatory Visit (INDEPENDENT_AMBULATORY_CARE_PROVIDER_SITE_OTHER): Payer: Commercial Managed Care - PPO

## 2023-01-21 DIAGNOSIS — J4550 Severe persistent asthma, uncomplicated: Secondary | ICD-10-CM | POA: Diagnosis not present

## 2023-02-15 ENCOUNTER — Other Ambulatory Visit: Payer: Self-pay | Admitting: Allergy & Immunology

## 2023-02-18 ENCOUNTER — Ambulatory Visit (INDEPENDENT_AMBULATORY_CARE_PROVIDER_SITE_OTHER): Payer: Commercial Managed Care - PPO

## 2023-02-18 DIAGNOSIS — J4550 Severe persistent asthma, uncomplicated: Secondary | ICD-10-CM

## 2023-03-05 ENCOUNTER — Other Ambulatory Visit: Payer: Self-pay | Admitting: *Deleted

## 2023-03-05 MED ORDER — NUCALA 100 MG/ML ~~LOC~~ SOAJ: 100.0000 mg | SUBCUTANEOUS | 11 refills | Status: DC

## 2023-03-20 ENCOUNTER — Ambulatory Visit (INDEPENDENT_AMBULATORY_CARE_PROVIDER_SITE_OTHER): Payer: Commercial Managed Care - PPO | Admitting: Allergy & Immunology

## 2023-03-20 ENCOUNTER — Encounter: Payer: Self-pay | Admitting: Allergy & Immunology

## 2023-03-20 ENCOUNTER — Ambulatory Visit: Payer: Commercial Managed Care - PPO

## 2023-03-20 VITALS — BP 126/68 | HR 102 | Temp 97.0°F | Resp 18

## 2023-03-20 DIAGNOSIS — E8801 Alpha-1-antitrypsin deficiency: Secondary | ICD-10-CM

## 2023-03-20 DIAGNOSIS — J331 Polypoid sinus degeneration: Secondary | ICD-10-CM

## 2023-03-20 DIAGNOSIS — J4550 Severe persistent asthma, uncomplicated: Secondary | ICD-10-CM | POA: Diagnosis not present

## 2023-03-20 DIAGNOSIS — K219 Gastro-esophageal reflux disease without esophagitis: Secondary | ICD-10-CM

## 2023-03-20 DIAGNOSIS — J3089 Other allergic rhinitis: Secondary | ICD-10-CM | POA: Diagnosis not present

## 2023-03-20 DIAGNOSIS — Z720 Tobacco use: Secondary | ICD-10-CM

## 2023-03-20 MED ORDER — XHANCE 93 MCG/ACT NA EXHU: 1.0000 | INHALANT_SUSPENSION | Freq: Every day | NASAL | 3 refills | Status: DC

## 2023-03-20 MED ORDER — VENTOLIN HFA 108 (90 BASE) MCG/ACT IN AERS: 2.0000 | INHALATION_SPRAY | RESPIRATORY_TRACT | 1 refills | Status: DC | PRN

## 2023-03-20 MED ORDER — AZELASTINE HCL 0.1 % NA SOLN: NASAL | 5 refills | Status: DC

## 2023-03-20 NOTE — Addendum Note (Signed)
Addended by: Florence Canner on: 03/20/2023 03:15 PM   Modules accepted: Orders

## 2023-03-20 NOTE — Addendum Note (Signed)
Addended by: Elsworth Soho on: 03/20/2023 04:51 PM   Modules accepted: Orders

## 2023-03-20 NOTE — Progress Notes (Signed)
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 COPD with asthma overlap syndrome - Lung function looks a bit worse, but of course it has looked worse in the past.  - We will continue with Nucala monthly. - As a reminder: you had the genetic changes that make you predisposed to complications from alpha one antitrypsin deficiency - This is made even worse by smoking since the cigarette smoke deactivates the enzyme that is working in your lungs. - If you can STOP smoking, we could get you on alpha tryptase replacement therapy, which will STABILIZE your lung function.  - Daily controller medication(s): Breztri two puffs TWICE daily with spacer + Nucala monthly - Prior to physical activity: albuterol 2 puffs 10-15 minutes before physical activity. - Rescue medications: albuterol 4 puffs every 4-6 hours as needed - Asthma control goals:  * Full participation in all desired activities (may need albuterol before activity) * Albuterol use two time or less a week on average (not counting use with activity) * Cough interfering with sleep two time or less a month * Oral steroids no more than once a year * No hospitalizations  2. Perennial allergic rhinitis (cat, dog) - Continue with Xhance one spray per nostril daily. - We will stop the allergy shots since you never felt that they did anything. - Continue with: nasal saline rinses  - Continue with: Allegra 180 mg once a day as needed for runny nose/itching - Continue with: Astelin twice daily as needed  3. Social  stressors  - I am glad that you are getting some counseling help.  - Hopefully the transitioning to Prozac will help with your smyptoms.   4. Return in about 3 months (around 06/20/2023).    Subjective:   Christina Esparza is a 62 y.o. female presenting today for follow up of No chief complaint on file.   Christina Esparza has a history of the following: Patient Active Problem List   Diagnosis Date Noted   Severe persistent asthma with acute exacerbation 06/24/2022   Dysfunction of both eustachian tubes 06/24/2022   Perennial allergic rhinitis 06/24/2022   Tobacco use 06/24/2022   Low back pain 04/05/2021   Peroneal neuropathy, right 04/05/2021   Carpal tunnel syndrome on right 03/23/2021   Primary osteoarthritis of both first carpometacarpal joints 03/23/2021   Right wrist pain 03/23/2021   Pain in right hip 12/28/2020   Asthma-COPD overlap syndrome 09/14/2020   Osteoarthritis cervical spine 05/04/2020   Gastroesophageal reflux disease 10/22/2019   GERD (gastroesophageal reflux disease) 10/21/2019   Shortness of breath 10/21/2019   Primary osteoarthritis of first carpometacarpal joint of right hand 09/17/2017   Anxiety state 08/24/2015    History obtained from: chart review and patient.  Christina Esparza is a 62 y.o. female presenting for a follow up visit.  We last saw her in December 2023.  At that time, her lung function looked very good.  We continued with Nucala monthly.  We continue with Breztri 2 puffs twice daily with a spacer.  For her rhinitis,  we continued with Xhance 1 spray per nostril daily.  We also continue with Allegra and Astelin.  She does have a history of alpha 1 antitrypsin deficiency (MZ phenotype with an alpha 1 antitrypsin level of 100).  We have never submitted for replacement therapy for her because she is+ a smoker.  Since last visit, she has done well.   She did find a therapist and she was paying out of pocket for it. She went to see Dr. Sherwood Gambler her PCP. She was  started on Zoloft 150mg . She was transitioned to Cymbalta and this caused her to feel that she was losing her mind for two or three months. She was up to 90mg  Cymbalta. In July or August, she was having panic attacks that led to her being unable to drive consistently. She had to pull over a lot. She finally found a therapist in December.   She is now transitioned to Prozac and she is increasing this as she is decreasing the Cymbalta. She was told that it would take 3-4 weeks for the Cymbalta to get out of her system and the same amount of time to get the Prozac working.  She does still have her sister who lives next door to her that she talks to on a daily basis.  Her husband has been out of town during the week at [employer] doing a job there.  This has been going on for a year and will be around 2 more years after this.  He does come home on the weekends.  This allows her a lot of time to unfortunately perseverate on various items which probably does not help her anxiety.  Asthma/Respiratory Symptom History: She remains on the Brezti two puffs BID. She feels that this is working well. She is on her Nucala monthly.  She is using her albuterol 2-3 times per day. Some of this is definitely panic attacks, but she will have some problems when she starts to walk sometimes. This is more of the gasping effort, which is more consistent with her typical panic attacks.   Allergic Rhinitis Symptom History: Her rhinitis symptoms are under good control with the Xhance.  She also has the Allegra that she uses once a day.  She has Astelin that she had during flares.  She continues to have her cat and her dog inside of the home, which are known triggers for her.  She does not feel any worse since she has been off of the allergy shots.  Otherwise, there have been no changes to her past medical history, surgical history, family history, or social history.    Review of Systems  Constitutional: Negative.  Negative for  chills, fever, malaise/fatigue and weight loss.  HENT: Negative.  Negative for congestion, ear discharge, ear pain and sinus pain.   Eyes:  Negative for pain, discharge and redness.  Respiratory:  Positive for cough, shortness of breath and wheezing. Negative for sputum production and stridor.   Cardiovascular: Negative.  Negative for chest pain and palpitations.  Gastrointestinal:  Negative for abdominal pain, constipation, diarrhea, heartburn, nausea and vomiting.  Skin: Negative.  Negative for itching and rash.  Neurological:  Negative for dizziness and headaches.  Endo/Heme/Allergies:  Positive for environmental allergies. Does not bruise/bleed easily.       Objective:   Blood pressure 126/68, pulse (!) 102, temperature (!) 97 F (36.1 C), temperature source Temporal, resp. rate 18, SpO2 97 %. There is no height or weight on file to calculate  BMI.    Physical Exam Vitals reviewed.  Constitutional:      Appearance: She is well-developed.     Comments: Anxious.  No tears today.   HENT:     Head: Normocephalic and atraumatic.     Right Ear: Tympanic membrane, ear canal and external ear normal.     Left Ear: Tympanic membrane, ear canal and external ear normal.     Nose: No nasal deformity, septal deviation, mucosal edema or rhinorrhea.     Right Turbinates: Enlarged, swollen and pale.     Left Turbinates: Enlarged, swollen and pale.     Right Sinus: No maxillary sinus tenderness or frontal sinus tenderness.     Left Sinus: No maxillary sinus tenderness or frontal sinus tenderness.     Comments: Scant clear rhinorrhea.. No polyps.    Mouth/Throat:     Mouth: Mucous membranes are not pale and not dry.     Pharynx: Uvula midline.  Eyes:     General: Lids are normal. Allergic shiner present.        Right eye: No discharge.        Left eye: No discharge.     Conjunctiva/sclera: Conjunctivae normal.     Right eye: Right conjunctiva is not injected. No chemosis.    Left eye:  Left conjunctiva is not injected. No chemosis.    Pupils: Pupils are equal, round, and reactive to light.  Cardiovascular:     Rate and Rhythm: Normal rate and regular rhythm.     Heart sounds: Normal heart sounds.  Pulmonary:     Effort: Pulmonary effort is normal. No tachypnea, accessory muscle usage or respiratory distress.     Breath sounds: Examination of the right-upper field reveals rhonchi. Examination of the left-upper field reveals rhonchi. Rhonchi present. No wheezing or rales.     Comments: Moving air well in upper fields.  No increased work of breathing. Chest:     Chest wall: No tenderness.  Lymphadenopathy:     Cervical: No cervical adenopathy.  Skin:    General: Skin is warm.     Capillary Refill: Capillary refill takes less than 2 seconds.     Coloration: Skin is not pale.     Findings: No abrasion, erythema, petechiae or rash. Rash is not papular, urticarial or vesicular.  Neurological:     Mental Status: She is alert.  Psychiatric:        Behavior: Behavior is cooperative.      Diagnostic studies:    Spirometry: results normal (FEV1: 0.87/42%, FVC: 1.65/62%, FEV1/FVC: 53%).    Spirometry consistent with possible restrictive disease. Numbers are worse than last time, although she is feeling better.   Allergy Studies: none       Malachi Bonds, MD  [facility]

## 2023-03-20 NOTE — Patient Instructions (Addendum)
1. COPD with asthma overlap syndrome - Lung function looks a bit worse, but of course it has looked worse in the past.  - We will continue with Nucala monthly. - As a reminder: you had the genetic changes that make you predisposed to complications from alpha one antitrypsin deficiency - This is made even worse by smoking since the cigarette smoke deactivates the enzyme that is working in your lungs. - If you can STOP smoking, we could get you on alpha tryptase replacement therapy, which will STABILIZE your lung function.  - Daily controller medication(s): Breztri two puffs TWICE daily with spacer + Nucala monthly - Prior to physical activity: albuterol 2 puffs 10-15 minutes before physical activity. - Rescue medications: albuterol 4 puffs every 4-6 hours as needed - Asthma control goals:  * Full participation in all desired activities (may need albuterol before activity) * Albuterol use two time or less a week on average (not counting use with activity) * Cough interfering with sleep two time or less a month * Oral steroids no more than once a year * No hospitalizations  2. Perennial allergic rhinitis (cat, dog) - Continue with Xhance one spray per nostril daily. - We will stop the allergy shots since you never felt that they did anything. - Continue with: nasal saline rinses  - Continue with: Allegra 180 mg once a day as needed for runny nose/itching - Continue with: Astelin twice daily as needed  3. Social stressors  - I am glad that you are getting some counseling help.  - Hopefully the transitioning to Prozac will help with your smyptoms.   4. Return in about 3 months (around 06/20/2023).    Please inform us of any Emergency Department visits, hospitalizations, or changes in symptoms. Call us before going to the ED for breathing or allergy symptoms since we might be able to fit you in for a sick visit. Feel free to contact us anytime with any questions, problems, or concerns.  It  was a pleasure to see you again today!  Websites that have reliable patient information: 1. American Academy of Asthma, Allergy, and Immunology: www.aaaai.org 2. Food Allergy Research and Education (FARE): foodallergy.org 3. Mothers of Asthmatics: http://www.asthmacommunitynetwork.org 4. American College of Allergy, Asthma, and Immunology: www.acaai.org   COVID-19 Vaccine Information can be found at: [email or web address] For questions related to vaccine distribution or appointments, please email [email or web address] or call [phone].   We realize that you might be concerned about having an allergic reaction to the COVID19 vaccines. To help with that concern, WE ARE OFFERING THE COVID19 VACCINES IN OUR OFFICE! Ask the front desk for dates!     "Like" [place] on Facebook and Instagram for our latest updates!      A healthy democracy works best when [employer] participate! Make sure you are registered to vote! If you have moved or changed any of your contact information, you will need to get this updated before voting!  In some cases, you MAY be able to register to vote online: [email or web address]

## 2023-03-26 ENCOUNTER — Other Ambulatory Visit: Payer: Self-pay

## 2023-03-26 ENCOUNTER — Encounter (HOSPITAL_COMMUNITY)
Admission: RE | Admit: 2023-03-26 | Discharge: 2023-03-26 | Disposition: A | Discharge status: Home / Self Care | Payer: Commercial Managed Care - PPO | Source: Ambulatory Visit | Attending: Ophthalmology | Admitting: Ophthalmology

## 2023-03-29 NOTE — H&P (Signed)
Surgical History & Physical  Patient Name: Christina Esparza DOB: [birth year]  Surgery: Cataract extraction with intraocular lens implant phacoemulsification; Left Eye  Surgeon: [name] MD Surgery Date:  04-02-23 Pre-Op Date:  05--06-24  HPI: A 19 Yr. old female patient is referred by Dr Charise Killian for cataract eval. 1. 1. The patient complains of difficulty when driving/seeing small captions on TV/driving at night, which began 1 year ago. Both eyes are affected, the left eye is worse. The episode is gradual. The condition's severity increased since last visit. Symptoms occur when the patient is driving, outside and reading. The complaint is associated with glare. This is negatively affecting the patient's quality of life and the patient is unable to function adequately in life with the current level of vision. Pt is using Xiidra BID OU. HPI was performed by [name] .  Medical History: Dry Eyes Cataracts Lung Problems anxiety, depression, acid reflux  Review of Systems Respiratory Asthma, COPD All recorded systems are negative except as noted above.  Social   Current every day smoker   Medication Xiidra,  Albuterol, Azelastine, Hydroxyzine, Xanax, Zoloft,   Sx/Procedures Sinus sx,   Drug Allergies  Penicillin, Codeine, Cephtin (antibiotic),   History & Physical: Heent: cataract, left eye NECK: supple without bruits LUNGS: lungs clear to auscultation CV: regular rate and rhythm Abdomen: soft and non-tender Impression & Plan: Assessment: 1.  COMBINED FORMS AGE RELATED CATARACT; Both Eyes (H25.813) 2.  ASTIGMATISM, REGULAR; Both Eyes (H52.223) 3.  BLEPHARITIS; Right Upper Lid, Right Lower Lid, Left Upper Lid, Left Lower Lid (H01.001, H01.002,H01.004,H01.005) 4.  DERMATOCHALASIS, no surgery; Right Upper Lid, Left Upper Lid (H02.831, H02.834) 5.  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  VITREOUS DETACHMENT PVD; Both Eyes (H43.813)  Plan: 1.  Cataract accounts for the  patient's decreased vision. This visual impairment is not correctable with a tolerable change in glasses or contact lenses. Cataract surgery with an implantation of a new lens should significantly improve the visual and functional status of the patient. Discussed all risks, benefits, alternatives, and potential complications. Discussed the procedures and recovery. Patient desires to have surgery. A-scan ordered and performed today for intra-ocular lens calculations. The surgery will be performed in order to improve vision for driving, reading, and for eye examinations. Recommend phacoemulsification with intra-ocular lens. Recommend Dextenza for post-operative pain and inflammation. Left Eye much worse - first. Dilates well - shugarcaine by protocol. Conisder Vivity IOL.  2.  Patient does not normally wear glasses for distance vision.  3.  Recommend regular lid cleaning. Warm compresses 7-10 minutes every day, both eyes.  4.  Asymptomatic, recommend observation for now. Findings, prognosis and treatment options reviewed.  5.  Observe; Artificial tears as needed for irritation.  6.  Old Asymptomatic. RD precautions given. Patient to call with increase in flashing lights/floaters/dark curtain.

## 2023-04-01 ENCOUNTER — Ambulatory Visit (HOSPITAL_COMMUNITY)
Admission: RE | Admit: 2023-04-01 | Discharge: 2023-04-01 | Disposition: A | Discharge status: Home / Self Care | Payer: Commercial Managed Care - PPO | Attending: Ophthalmology | Admitting: Ophthalmology

## 2023-04-01 ENCOUNTER — Encounter (HOSPITAL_COMMUNITY)
Admission: RE | Disposition: A | Discharge status: Home / Self Care | Payer: Self-pay | Source: Home / Self Care | Attending: Ophthalmology

## 2023-04-01 ENCOUNTER — Ambulatory Visit (HOSPITAL_COMMUNITY): Payer: Commercial Managed Care - PPO | Admitting: Anesthesiology

## 2023-04-01 ENCOUNTER — Ambulatory Visit (HOSPITAL_BASED_OUTPATIENT_CLINIC_OR_DEPARTMENT_OTHER): Payer: Commercial Managed Care - PPO | Admitting: Anesthesiology

## 2023-04-01 ENCOUNTER — Encounter (HOSPITAL_COMMUNITY): Payer: Self-pay | Admitting: Ophthalmology

## 2023-04-01 DIAGNOSIS — H02831 Dermatochalasis of right upper eyelid: Secondary | ICD-10-CM | POA: Diagnosis not present

## 2023-04-01 DIAGNOSIS — J4489 Other specified chronic obstructive pulmonary disease: Secondary | ICD-10-CM | POA: Insufficient documentation

## 2023-04-01 DIAGNOSIS — F17210 Nicotine dependence, cigarettes, uncomplicated: Secondary | ICD-10-CM | POA: Diagnosis not present

## 2023-04-01 DIAGNOSIS — H25812 Combined forms of age-related cataract, left eye: Secondary | ICD-10-CM

## 2023-04-01 DIAGNOSIS — H25813 Combined forms of age-related cataract, bilateral: Secondary | ICD-10-CM | POA: Insufficient documentation

## 2023-04-01 DIAGNOSIS — J449 Chronic obstructive pulmonary disease, unspecified: Secondary | ICD-10-CM

## 2023-04-01 DIAGNOSIS — H0100A Unspecified blepharitis right eye, upper and lower eyelids: Secondary | ICD-10-CM | POA: Diagnosis not present

## 2023-04-01 DIAGNOSIS — H0100B Unspecified blepharitis left eye, upper and lower eyelids: Secondary | ICD-10-CM | POA: Diagnosis not present

## 2023-04-01 DIAGNOSIS — F418 Other specified anxiety disorders: Secondary | ICD-10-CM | POA: Diagnosis not present

## 2023-04-01 DIAGNOSIS — F419 Anxiety disorder, unspecified: Secondary | ICD-10-CM | POA: Diagnosis not present

## 2023-04-01 DIAGNOSIS — F17200 Nicotine dependence, unspecified, uncomplicated: Secondary | ICD-10-CM | POA: Insufficient documentation

## 2023-04-01 DIAGNOSIS — H02834 Dermatochalasis of left upper eyelid: Secondary | ICD-10-CM | POA: Diagnosis not present

## 2023-04-01 DIAGNOSIS — H11151 Pinguecula, right eye: Secondary | ICD-10-CM | POA: Insufficient documentation

## 2023-04-01 DIAGNOSIS — H43813 Vitreous degeneration, bilateral: Secondary | ICD-10-CM | POA: Insufficient documentation

## 2023-04-01 DIAGNOSIS — K219 Gastro-esophageal reflux disease without esophagitis: Secondary | ICD-10-CM | POA: Insufficient documentation

## 2023-04-01 DIAGNOSIS — F32A Depression, unspecified: Secondary | ICD-10-CM | POA: Insufficient documentation

## 2023-04-01 DIAGNOSIS — H52223 Regular astigmatism, bilateral: Secondary | ICD-10-CM | POA: Insufficient documentation

## 2023-04-01 HISTORY — PX: CATARACT EXTRACTION W/PHACO: SHX586

## 2023-04-01 SURGERY — PHACOEMULSIFICATION, CATARACT, WITH IOL INSERTION
Anesthesia: Monitor Anesthesia Care | Site: Eye | Laterality: Left

## 2023-04-01 MED ORDER — PHENYLEPHRINE HCL 2.5 % OP SOLN
1.0000 [drp] | OPHTHALMIC | Status: AC | PRN
  Administered 2023-04-01 (×3): 1 [drp] via OPHTHALMIC

## 2023-04-01 MED ORDER — SODIUM HYALURONATE 10 MG/ML IO SOLUTION
PREFILLED_SYRINGE | INTRAOCULAR | Status: DC | PRN
  Administered 2023-04-01: .85 mL via INTRAOCULAR

## 2023-04-01 MED ORDER — SODIUM HYALURONATE 23MG/ML IO SOSY
PREFILLED_SYRINGE | INTRAOCULAR | Status: DC | PRN
  Administered 2023-04-01: .6 mL via INTRAOCULAR

## 2023-04-01 MED ORDER — EPINEPHRINE PF 1 MG/ML IJ SOLN
INTRAMUSCULAR | Status: AC
  Filled 2023-04-01: qty 2

## 2023-04-01 MED ORDER — TROPICAMIDE 1 % OP SOLN
1.0000 [drp] | OPHTHALMIC | Status: AC | PRN
  Administered 2023-04-01 (×3): 1 [drp] via OPHTHALMIC

## 2023-04-01 MED ORDER — POVIDONE-IODINE 5 % OP SOLN
OPHTHALMIC | Status: DC | PRN
  Administered 2023-04-01: 1 via OPHTHALMIC

## 2023-04-01 MED ORDER — NEOMYCIN-POLYMYXIN-DEXAMETH 3.5-10000-0.1 OP SUSP
OPHTHALMIC | Status: DC | PRN
  Administered 2023-04-01: 2 [drp] via OPHTHALMIC

## 2023-04-01 MED ORDER — SODIUM CHLORIDE 0.9% FLUSH
INTRAVENOUS | Status: DC | PRN
  Administered 2023-04-01: 5 mL via INTRAVENOUS

## 2023-04-01 MED ORDER — MIDAZOLAM HCL 2 MG/2ML IJ SOLN
INTRAMUSCULAR | Status: AC
  Filled 2023-04-01: qty 2

## 2023-04-01 MED ORDER — EPINEPHRINE PF 1 MG/ML IJ SOLN
INTRAOCULAR | Status: DC | PRN
  Administered 2023-04-01: 500 mL

## 2023-04-01 MED ORDER — BSS IO SOLN
INTRAOCULAR | Status: DC | PRN
  Administered 2023-04-01: 15 mL via INTRAOCULAR

## 2023-04-01 MED ORDER — MIDAZOLAM HCL 2 MG/2ML IJ SOLN
INTRAMUSCULAR | Status: DC | PRN
  Administered 2023-04-01: 1 mg via INTRAVENOUS

## 2023-04-01 MED ORDER — LIDOCAINE HCL (PF) 1 % IJ SOLN
INTRAOCULAR | Status: DC | PRN
  Administered 2023-04-01: 1 mL via OPHTHALMIC

## 2023-04-01 MED ORDER — TETRACAINE HCL 0.5 % OP SOLN
1.0000 [drp] | OPHTHALMIC | Status: AC | PRN
  Administered 2023-04-01 (×3): 1 [drp] via OPHTHALMIC

## 2023-04-01 MED ORDER — STERILE WATER FOR IRRIGATION IR SOLN
Status: DC | PRN
  Administered 2023-04-01: 25 mL

## 2023-04-01 MED ORDER — LIDOCAINE HCL 3.5 % OP GEL
1.0000 | Freq: Once | OPHTHALMIC | Status: AC
  Administered 2023-04-01: 1 via OPHTHALMIC

## 2023-04-01 SURGICAL SUPPLY — 13 items
CATARACT SUITE SIGHTPATH (MISCELLANEOUS) ×1 IMPLANT
CLOTH BEACON ORANGE TIMEOUT ST (SAFETY) ×2 IMPLANT
EYE SHIELD UNIVERSAL CLEAR (GAUZE/BANDAGES/DRESSINGS) IMPLANT
FEE CATARACT SUITE SIGHTPATH (MISCELLANEOUS) ×2 IMPLANT
GLOVE BIOGEL PI IND STRL 7.0 (GLOVE) ×4 IMPLANT
LENS IOL RAYNER 23.0 (Intraocular Lens) ×1 IMPLANT
LENS IOL RAYONE EMV 23.0 (Intraocular Lens) IMPLANT
NDL HYPO 18GX1.5 BLUNT FILL (NEEDLE) ×2 IMPLANT
NEEDLE HYPO 18GX1.5 BLUNT FILL (NEEDLE) ×1 IMPLANT
PAD ARMBOARD 7.5X6 YLW CONV (MISCELLANEOUS) ×2 IMPLANT
SYR TB 1ML LL NO SAFETY (SYRINGE) ×2 IMPLANT
TAPE SURG TRANSPORE 1 IN (GAUZE/BANDAGES/DRESSINGS) IMPLANT
WATER STERILE IRR 250ML POUR (IV SOLUTION) ×2 IMPLANT

## 2023-04-01 NOTE — Transfer of Care (Signed)
Immediate Anesthesia Transfer of Care Note  Patient: Christina Esparza  Procedure(s) Performed: CATARACT EXTRACTION PHACO AND INTRAOCULAR LENS PLACEMENT (IOC) (Left: Eye)  Patient Location: Short Stay  Anesthesia Type:MAC  Level of Consciousness: awake, alert , and oriented  Airway & Oxygen Therapy: Patient Spontanous Breathing  Post-op Assessment: Report given to RN and Post -op Vital signs reviewed and stable  Post vital signs: Reviewed and stable  Last Vitals:  Vitals Value Taken Time  BP    Temp    Pulse    Resp    SpO2      Last Pain:  Vitals:   04/01/23 1045  TempSrc: Oral  PainSc: 0-No pain      Patients Stated Pain Goal: 7 (04/01/23 1045)  Complications: No notable events documented.

## 2023-04-01 NOTE — Discharge Instructions (Addendum)
Please discharge patient when stable, will follow up today with Dr. Wrzosek at the [facility] office immediately following discharge.  Leave shield in place until visit.  All paperwork with discharge instructions will be given at the office.  [facility] Address:  [address]  [place], [place] [address]  

## 2023-04-01 NOTE — Interval H&P Note (Signed)
History and Physical Interval Note:  04/01/2023 11:19 AM  Christina Esparza  has presented today for surgery, with the diagnosis of combined forms age related cataract; left.  The various methods of treatment have been discussed with the patient and family. After consideration of risks, benefits and other options for treatment, the patient has consented to  Procedure(s) with comments: CATARACT EXTRACTION PHACO AND INTRAOCULAR LENS PLACEMENT (IOC) (Left) - CDE: as a surgical intervention.  The patient's history has been reviewed, patient examined, no change in status, stable for surgery.  I have reviewed the patient's chart and labs.  Questions were answered to the patient's satisfaction.     [name]

## 2023-04-01 NOTE — Op Note (Signed)
Date of procedure: 04/01/23  Pre-operative diagnosis: Visually significant age-related combined cataract, Left Eye (H25.812)  Post-operative diagnosis: Visually significant age-related combined cataract, Left Eye (H25.812)  Procedure: Removal of cataract via phacoemulsification and insertion of intra-ocular lens Rayner [id number] +23.0D into the capsular bag of the Left Eye  Attending surgeon: Rudy Jew. Moussa Wiegand, MD, MA  Anesthesia: MAC, Topical Akten  Complications: None  Estimated Blood Loss: <43mL (minimal)  Specimens: None  Implants: As above  Indications:  Visually significant age-related cataract, Left Eye  Procedure:  The patient was seen and identified in the pre-operative area. The operative eye was identified and dilated.  The operative eye was marked.  Topical anesthesia was administered to the operative eye.     The patient was then to the operative suite and placed in the supine position.  A timeout was performed confirming the patient, procedure to be performed, and all other relevant information.   The patient's face was prepped and draped in the usual fashion for intra-ocular surgery.  A lid speculum was placed into the operative eye and the surgical microscope moved into place and focused.  An inferotemporal paracentesis was created using a 20 gauge paracentesis blade.  Shugarcaine was injected into the anterior chamber.  Viscoelastic was injected into the anterior chamber.  A temporal clear-corneal main wound incision was created using a 2.91mm microkeratome.  A continuous curvilinear capsulorrhexis was initiated using an irrigating cystitome and completed using capsulorrhexis forceps.  Hydrodissection and hydrodeliniation were performed.  Viscoelastic was injected into the anterior chamber.  A phacoemulsification handpiece and a chopper as a second instrument were used to remove the nucleus and epinucleus. The irrigation/aspiration handpiece was used to remove any remaining  cortical material.   The capsular bag was reinflated with viscoelastic, checked, and found to be intact.  The intraocular lens was inserted into the capsular bag.  The irrigation/aspiration handpiece was used to remove any remaining viscoelastic.  The clear corneal wound and paracentesis wounds were then hydrated and checked with Weck-Cels to be watertight. Maxitrol drops were instilled into the operative eye. The lid-speculum was removed.  The drape was removed.  The patient's face was cleaned with a wet and dry 4x4.    A clear shield was taped over the eye. The patient was taken to the post-operative care unit in good condition, having tolerated the procedure well.  Post-Op Instructions: The patient will follow up at [facility] for a same day post-operative evaluation and will receive all other orders and instructions.

## 2023-04-01 NOTE — Anesthesia Procedure Notes (Signed)
Procedure Name: MAC Date/Time: 04/01/2023 11:28 AM  Performed by: Julian Reil, CRNAPre-anesthesia Checklist: Patient identified, Emergency Drugs available, Suction available and Patient being monitored Patient Re-evaluated:Patient Re-evaluated prior to induction Oxygen Delivery Method: Nasal cannula Placement Confirmation: positive ETCO2

## 2023-04-01 NOTE — Anesthesia Preprocedure Evaluation (Signed)
Anesthesia Evaluation  Patient identified by MRN, date of birth, ID band Patient awake    Reviewed: Allergy & Precautions, H&P , NPO status , Patient's Chart, lab work & pertinent test results  Airway Mallampati: II  TM Distance: >3 FB Neck ROM: Full    Dental  (+) Dental Advisory Given, Teeth Intact   Pulmonary shortness of breath and with exertion, asthma , COPD,  COPD inhaler, Current Smoker and Patient abstained from smoking.   Pulmonary exam normal breath sounds clear to auscultation       Cardiovascular negative cardio ROS Normal cardiovascular exam Rhythm:Regular Rate:Normal     Neuro/Psych  PSYCHIATRIC DISORDERS Anxiety Depression     Neuromuscular disease    GI/Hepatic Neg liver ROS,GERD  Medicated and Controlled,,  Endo/Other  negative endocrine ROS    Renal/GU negative Renal ROS  negative genitourinary   Musculoskeletal  (+) Arthritis , Osteoarthritis,    Abdominal   Peds negative pediatric ROS (+)  Hematology negative hematology ROS (+)   Anesthesia Other Findings   Reproductive/Obstetrics negative OB ROS                             Anesthesia Physical Anesthesia Plan  ASA: 3  Anesthesia Plan: MAC   Post-op Pain Management: Minimal or no pain anticipated   Induction: Intravenous  PONV Risk Score and Plan: Treatment may vary due to age or medical condition  Airway Management Planned: Nasal Cannula and Natural Airway  Additional Equipment:   Intra-op Plan:   Post-operative Plan:   Informed Consent: I have reviewed the patients History and Physical, chart, labs and discussed the procedure including the risks, benefits and alternatives for the proposed anesthesia with the patient or authorized representative who has indicated his/her understanding and acceptance.     Dental advisory given  Plan Discussed with: CRNA and Surgeon  Anesthesia Plan Comments:          Anesthesia Quick Evaluation

## 2023-04-01 NOTE — Anesthesia Postprocedure Evaluation (Signed)
Anesthesia Post Note  Patient: Christina Esparza  Procedure(s) Performed: CATARACT EXTRACTION PHACO AND INTRAOCULAR LENS PLACEMENT (IOC) (Left: Eye)  Patient location during evaluation: Phase II Anesthesia Type: MAC Level of consciousness: awake and alert and oriented Pain management: pain level controlled Vital Signs Assessment: post-procedure vital signs reviewed and stable Respiratory status: spontaneous breathing, nonlabored ventilation and respiratory function stable Cardiovascular status: stable and blood pressure returned to baseline Postop Assessment: no apparent nausea or vomiting Anesthetic complications: no  No notable events documented.   Last Vitals:  Vitals:   04/01/23 1045 04/01/23 1145  BP: (!) 103/56 117/70  Pulse:  83  Resp: 18 (!) 21  Temp: 36.7 C 36.6 C  SpO2: 98% 98%    Last Pain:  Vitals:   04/01/23 1145  TempSrc: Oral  PainSc: 0-No pain                 Sivan Quast C Jairen Goldfarb

## 2023-04-05 ENCOUNTER — Encounter (HOSPITAL_COMMUNITY): Payer: Self-pay | Admitting: Ophthalmology

## 2023-04-17 ENCOUNTER — Ambulatory Visit: Payer: Commercial Managed Care - PPO

## 2023-04-19 ENCOUNTER — Ambulatory Visit (INDEPENDENT_AMBULATORY_CARE_PROVIDER_SITE_OTHER): Payer: Commercial Managed Care - PPO

## 2023-04-19 DIAGNOSIS — J4550 Severe persistent asthma, uncomplicated: Secondary | ICD-10-CM

## 2023-04-19 NOTE — H&P (Signed)
Surgical History & Physical  Patient Name: Christina Esparza DOB: 02/22/1961  Surgery: Cataract extraction with intraocular lens implant phacoemulsification; Right Eye  Surgeon: [name] MD Surgery Date:  04-29-23 Pre-Op Date:  04-08-23  HPI: A 31 Yr. old female patient present for 7 day post op OS. Distance vision is much better. Eye feels "weird, sticky feeling" since sx. Using Moxifloxacin TID, Prednisolone TID, and Prolensa qd OS. Difficulties with glare during the day and at night when driving. Difficulties focusing on things because of the blurred vision. This is negatively affecting the patient's quality of life and the patient is unable to function adequately in life with the current level of vision. Patient would like to proceed with cataract sx OD. HPI was performed by [name] .  Medical History: Dry Eyes Cataracts Lung Problems anxiety, depression, acid reflux  Review of Systems Respiratory Asthma, COPD All recorded systems are negative except as noted above.  Social   Current every day smoker   Medication Xiidra, Prednisolone acetate 1%, Prolensa, Moxifloxacin,  Albuterol, Azelastine, Hydroxyzine, Xanax, Zoloft,   Sx/Procedures Phaco c IOL OS,  Sinus sx,   Drug Allergies  Penicillin, Codeine, Cephtin (antibiotic),   History & Physical: Heent: cataract, right eye NECK: supple without bruits LUNGS: lungs clear to auscultation CV: regular rate and rhythm Abdomen: soft and non-tender Impression & Plan: Assessment: 1.  CATARACT EXTRACTION STATUS; Left Eye (Z98.42) 2.  COMBINED FORMS AGE RELATED CATARACT; Right Eye (H25.811) 3.  Dry Eye Syndrome; Both Eyes (H04.123)  Plan: 1.  1 week after cataract surgery. Doing well with improved vision and normal eye pressure. Call with any problems or concerns. Stop Moxifloxacin. Continue Prolensa 1 drop 1x/day for 3 more weeks. Continue Pred Acetate 1 drop 2x/day for 3 more weeks.  2.  Cataract accounts for the  patient's decreased vision. This visual impairment is not correctable with a tolerable change in glasses or contact lenses. Cataract surgery with an implantation of a new lens should significantly improve the visual and functional status of the patient. Discussed all risks, benefits, alternatives, and potential complications. Discussed the procedures and recovery. Patient desires to have surgery. A-scan ordered and performed today for intra-ocular lens calculations. The surgery will be performed in order to improve vision for driving, reading, and for eye examinations. Recommend phacoemulsification with intra-ocular lens. Recommend Dextenza for post-operative pain and inflammation. Right Eye. Surgery required to correct imbalance of vision. Dilates well - shugarcaine by protocol.  3.  Recommend artificial tears, as needed. Continue Xiidra 1 drop every 12 hours both eyes.

## 2023-04-23 ENCOUNTER — Encounter (HOSPITAL_COMMUNITY): Payer: Self-pay

## 2023-04-23 ENCOUNTER — Other Ambulatory Visit: Payer: Self-pay

## 2023-04-23 ENCOUNTER — Encounter (HOSPITAL_COMMUNITY)
Admission: RE | Admit: 2023-04-23 | Discharge: 2023-04-23 | Disposition: A | Discharge status: Home / Self Care | Payer: Commercial Managed Care - PPO | Source: Ambulatory Visit | Attending: Ophthalmology | Admitting: Ophthalmology

## 2023-04-29 ENCOUNTER — Ambulatory Visit (HOSPITAL_COMMUNITY)
Admission: RE | Admit: 2023-04-29 | Discharge: 2023-04-29 | Disposition: A | Discharge status: Home / Self Care | Payer: Commercial Managed Care - PPO | Attending: Ophthalmology | Admitting: Ophthalmology

## 2023-04-29 ENCOUNTER — Ambulatory Visit (HOSPITAL_BASED_OUTPATIENT_CLINIC_OR_DEPARTMENT_OTHER): Payer: Commercial Managed Care - PPO | Admitting: Anesthesiology

## 2023-04-29 ENCOUNTER — Ambulatory Visit (HOSPITAL_COMMUNITY): Payer: Commercial Managed Care - PPO | Admitting: Anesthesiology

## 2023-04-29 ENCOUNTER — Encounter (HOSPITAL_COMMUNITY)
Admission: RE | Disposition: A | Discharge status: Home / Self Care | Payer: Self-pay | Source: Home / Self Care | Attending: Ophthalmology

## 2023-04-29 DIAGNOSIS — F418 Other specified anxiety disorders: Secondary | ICD-10-CM | POA: Diagnosis not present

## 2023-04-29 DIAGNOSIS — F32A Depression, unspecified: Secondary | ICD-10-CM | POA: Diagnosis not present

## 2023-04-29 DIAGNOSIS — F17210 Nicotine dependence, cigarettes, uncomplicated: Secondary | ICD-10-CM

## 2023-04-29 DIAGNOSIS — H04123 Dry eye syndrome of bilateral lacrimal glands: Secondary | ICD-10-CM | POA: Insufficient documentation

## 2023-04-29 DIAGNOSIS — F419 Anxiety disorder, unspecified: Secondary | ICD-10-CM | POA: Insufficient documentation

## 2023-04-29 DIAGNOSIS — J449 Chronic obstructive pulmonary disease, unspecified: Secondary | ICD-10-CM | POA: Diagnosis not present

## 2023-04-29 DIAGNOSIS — F17200 Nicotine dependence, unspecified, uncomplicated: Secondary | ICD-10-CM | POA: Diagnosis not present

## 2023-04-29 DIAGNOSIS — Z9842 Cataract extraction status, left eye: Secondary | ICD-10-CM | POA: Diagnosis not present

## 2023-04-29 DIAGNOSIS — H25811 Combined forms of age-related cataract, right eye: Secondary | ICD-10-CM

## 2023-04-29 HISTORY — PX: CATARACT EXTRACTION W/PHACO: SHX586

## 2023-04-29 SURGERY — PHACOEMULSIFICATION, CATARACT, WITH IOL INSERTION
Anesthesia: Monitor Anesthesia Care | Site: Eye | Laterality: Right

## 2023-04-29 MED ORDER — SODIUM HYALURONATE 23MG/ML IO SOSY
PREFILLED_SYRINGE | INTRAOCULAR | Status: DC | PRN
  Administered 2023-04-29: .6 mL via INTRAOCULAR

## 2023-04-29 MED ORDER — MIDAZOLAM HCL 2 MG/2ML IJ SOLN
INTRAMUSCULAR | Status: AC
  Filled 2023-04-29: qty 2

## 2023-04-29 MED ORDER — FENTANYL CITRATE (PF) 100 MCG/2ML IJ SOLN
INTRAMUSCULAR | Status: AC
  Filled 2023-04-29: qty 2

## 2023-04-29 MED ORDER — PHENYLEPHRINE HCL 2.5 % OP SOLN
1.0000 [drp] | OPHTHALMIC | Status: AC | PRN
  Administered 2023-04-29 (×3): 1 [drp] via OPHTHALMIC

## 2023-04-29 MED ORDER — TROPICAMIDE 1 % OP SOLN
1.0000 [drp] | OPHTHALMIC | Status: AC | PRN
  Administered 2023-04-29 (×3): 1 [drp] via OPHTHALMIC

## 2023-04-29 MED ORDER — NEOMYCIN-POLYMYXIN-DEXAMETH 3.5-10000-0.1 OP SUSP
OPHTHALMIC | Status: DC | PRN
  Administered 2023-04-29: 2 [drp] via OPHTHALMIC

## 2023-04-29 MED ORDER — LIDOCAINE HCL 3.5 % OP GEL
1.0000 | Freq: Once | OPHTHALMIC | Status: AC
  Administered 2023-04-29: 1 via OPHTHALMIC

## 2023-04-29 MED ORDER — TETRACAINE HCL 0.5 % OP SOLN
1.0000 [drp] | OPHTHALMIC | Status: AC | PRN
  Administered 2023-04-29 (×3): 1 [drp] via OPHTHALMIC

## 2023-04-29 MED ORDER — EPINEPHRINE PF 1 MG/ML IJ SOLN
INTRAOCULAR | Status: DC | PRN
  Administered 2023-04-29: 500 mL

## 2023-04-29 MED ORDER — BSS IO SOLN
INTRAOCULAR | Status: DC | PRN
  Administered 2023-04-29: 15 mL via INTRAOCULAR

## 2023-04-29 MED ORDER — STERILE WATER FOR IRRIGATION IR SOLN
Status: DC | PRN
  Administered 2023-04-29: 250 mL

## 2023-04-29 MED ORDER — SODIUM HYALURONATE 10 MG/ML IO SOLUTION
PREFILLED_SYRINGE | INTRAOCULAR | Status: DC | PRN
  Administered 2023-04-29: .85 mL via INTRAOCULAR

## 2023-04-29 MED ORDER — POVIDONE-IODINE 5 % OP SOLN
OPHTHALMIC | Status: DC | PRN
  Administered 2023-04-29: 1 via OPHTHALMIC

## 2023-04-29 MED ORDER — SODIUM CHLORIDE 0.9% FLUSH
INTRAVENOUS | Status: DC | PRN
  Administered 2023-04-29: 5 mL via INTRAVENOUS

## 2023-04-29 MED ORDER — MIDAZOLAM HCL 2 MG/2ML IJ SOLN
INTRAMUSCULAR | Status: DC | PRN
  Administered 2023-04-29: 1 mg via INTRAVENOUS

## 2023-04-29 MED ORDER — LIDOCAINE HCL (PF) 1 % IJ SOLN
INTRAOCULAR | Status: DC | PRN
  Administered 2023-04-29: 1 mL via OPHTHALMIC

## 2023-04-29 MED ORDER — FENTANYL CITRATE (PF) 100 MCG/2ML IJ SOLN
INTRAMUSCULAR | Status: DC | PRN
  Administered 2023-04-29: 50 ug via INTRAVENOUS

## 2023-04-29 SURGICAL SUPPLY — 14 items
CATARACT SUITE SIGHTPATH (MISCELLANEOUS) ×1 IMPLANT
CLOTH BEACON ORANGE TIMEOUT ST (SAFETY) ×2 IMPLANT
EYE SHIELD UNIVERSAL CLEAR (GAUZE/BANDAGES/DRESSINGS) IMPLANT
FEE CATARACT SUITE SIGHTPATH (MISCELLANEOUS) ×2 IMPLANT
GLOVE BIOGEL PI IND STRL 7.0 (GLOVE) ×4 IMPLANT
LENS IOL RAYNER 23.5 (Intraocular Lens) ×1 IMPLANT
LENS IOL RAYONE EMV 23.5 (Intraocular Lens) IMPLANT
NDL HYPO 18GX1.5 BLUNT FILL (NEEDLE) ×2 IMPLANT
NEEDLE HYPO 18GX1.5 BLUNT FILL (NEEDLE) ×1 IMPLANT
PAD ARMBOARD 7.5X6 YLW CONV (MISCELLANEOUS) ×2 IMPLANT
RING MALYGIN 7.0 (MISCELLANEOUS) IMPLANT
SYR TB 1ML LL NO SAFETY (SYRINGE) ×2 IMPLANT
TAPE SURG TRANSPORE 1 IN (GAUZE/BANDAGES/DRESSINGS) IMPLANT
WATER STERILE IRR 250ML POUR (IV SOLUTION) ×2 IMPLANT

## 2023-04-29 NOTE — Interval H&P Note (Signed)
History and Physical Interval Note:  04/29/2023 9:06 AM  Christina Esparza  has presented today for surgery, with the diagnosis of combined forms age related cataract; right.  The various methods of treatment have been discussed with the patient and family. After consideration of risks, benefits and other options for treatment, the patient has consented to  Procedure(s): CATARACT EXTRACTION PHACO AND INTRAOCULAR LENS PLACEMENT (IOC) (Right) as a surgical intervention.  The patient's history has been reviewed, patient examined, no change in status, stable for surgery.  I have reviewed the patient's chart and labs.  Questions were answered to the patient's satisfaction.     [name]

## 2023-04-29 NOTE — Anesthesia Postprocedure Evaluation (Signed)
Anesthesia Post Note  Patient: Christina Esparza  Procedure(s) Performed: CATARACT EXTRACTION PHACO AND INTRAOCULAR LENS PLACEMENT (IOC) (Right: Eye)  Patient location during evaluation: Phase II Anesthesia Type: MAC Level of consciousness: awake and alert and oriented Pain management: pain level controlled Vital Signs Assessment: post-procedure vital signs reviewed and stable Respiratory status: spontaneous breathing, nonlabored ventilation and respiratory function stable Cardiovascular status: stable and blood pressure returned to baseline Postop Assessment: no apparent nausea or vomiting Anesthetic complications: no  No notable events documented.   Last Vitals:  Vitals:   04/29/23 0815 04/29/23 0933  BP:  114/66  Pulse: 85 82  Resp: 19 14  Temp:  36.6 C  SpO2: 96% 94%    Last Pain:  Vitals:   04/29/23 0933  TempSrc: Oral  PainSc: 0-No pain                 Selen Smucker C Genetta Fiero

## 2023-04-29 NOTE — Anesthesia Preprocedure Evaluation (Signed)
Anesthesia Evaluation  Patient identified by MRN, date of birth, ID band Patient awake    Reviewed: Allergy & Precautions, H&P , NPO status , Patient's Chart, lab work & pertinent test results  Airway Mallampati: II  TM Distance: >3 FB Neck ROM: Full    Dental  (+) Dental Advisory Given, Teeth Intact   Pulmonary shortness of breath and with exertion, asthma , COPD,  COPD inhaler, Current Smoker and Patient abstained from smoking.   Pulmonary exam normal breath sounds clear to auscultation       Cardiovascular negative cardio ROS Normal cardiovascular exam Rhythm:Regular Rate:Normal     Neuro/Psych  PSYCHIATRIC DISORDERS Anxiety Depression     Neuromuscular disease    GI/Hepatic Neg liver ROS,GERD  Medicated and Controlled,,  Endo/Other  negative endocrine ROS    Renal/GU negative Renal ROS  negative genitourinary   Musculoskeletal  (+) Arthritis , Osteoarthritis,    Abdominal   Peds negative pediatric ROS (+)  Hematology negative hematology ROS (+)   Anesthesia Other Findings   Reproductive/Obstetrics negative OB ROS                             Anesthesia Physical Anesthesia Plan  ASA: 3  Anesthesia Plan: MAC   Post-op Pain Management: Minimal or no pain anticipated   Induction: Intravenous  PONV Risk Score and Plan: Treatment may vary due to age or medical condition  Airway Management Planned: Nasal Cannula and Natural Airway  Additional Equipment:   Intra-op Plan:   Post-operative Plan:   Informed Consent: I have reviewed the patients History and Physical, chart, labs and discussed the procedure including the risks, benefits and alternatives for the proposed anesthesia with the patient or authorized representative who has indicated his/her understanding and acceptance.     Dental advisory given  Plan Discussed with: CRNA and Surgeon  Anesthesia Plan Comments:          Anesthesia Quick Evaluation  

## 2023-04-29 NOTE — Transfer of Care (Addendum)
Immediate Anesthesia Transfer of Care Note  Patient: Christina Esparza  Procedure(s) Performed: CATARACT EXTRACTION PHACO AND INTRAOCULAR LENS PLACEMENT (IOC) (Right: Eye)  Patient Location: PACU  Anesthesia Type:MAC  Level of Consciousness: awake, alert , and patient cooperative  Airway & Oxygen Therapy: Patient Spontanous Breathing  Post-op Assessment: Report given to RN and Post -op Vital signs reviewed and stable  Post vital signs: Reviewed and stable  Last Vitals:  Vitals Value Taken Time  BP 114/66 04/29/2023     0933  Temp 36.6 04/29/2023     0933  Pulse 84 04/29/2023     0933  Resp 14 04/29/2023     0933  SpO2 94% RA 04/29/2023     0933    Last Pain:  Vitals:   04/29/23 0804  PainSc: 0-No pain         Complications: No notable events documented.

## 2023-04-29 NOTE — Discharge Instructions (Addendum)
Please discharge patient when stable, will follow up today with Dr. Wrzosek at the [facility] office immediately following discharge.  Leave shield in place until visit.  All paperwork with discharge instructions will be given at the office.  [facility] Address:  [address]  [place], [place] [address]  

## 2023-04-29 NOTE — Op Note (Signed)
Date of procedure: 04/29/23  Pre-operative diagnosis:  Visually significant combined form age-related cataract, Right Eye (H25.811)  Post-operative diagnosis:  Visually significant combined form age-related cataract, Right Eye (H25.811)  Procedure: Removal of cataract via phacoemulsification and insertion of intra-ocular lens Rayner [id number] +23.5D into the capsular bag of the Right Eye  Attending surgeon: Rudy Jew. Arben Packman, MD, MA  Anesthesia: MAC, Topical [place]  Complications: None  Estimated Blood Loss: <19mL (minimal)  Specimens: None  Implants: As above  Indications:  Visually significant age-related cataract, Right Eye  Procedure:  The patient was seen and identified in the pre-operative area. The operative eye was identified and dilated.  The operative eye was marked.  Topical anesthesia was administered to the operative eye.     The patient was then to the operative suite and placed in the supine position.  A timeout was performed confirming the patient, procedure to be performed, and all other relevant information.   The patient's face was prepped and draped in the usual fashion for intra-ocular surgery.  A lid speculum was placed into the operative eye and the surgical microscope moved into place and focused.  A superotemporal paracentesis was created using a 20 gauge paracentesis blade.  Shugarcaine was injected into the anterior chamber.  Viscoelastic was injected into the anterior chamber.  A temporal clear-corneal main wound incision was created using a 2.30mm microkeratome.  A continuous curvilinear capsulorrhexis was initiated using an irrigating cystitome and completed using capsulorrhexis forceps.  Hydrodissection and hydrodeliniation were performed.  Viscoelastic was injected into the anterior chamber.  A phacoemulsification handpiece and a chopper as a second instrument were used to remove the nucleus and epinucleus. The irrigation/aspiration handpiece was used to remove any  remaining cortical material.   The capsular bag was reinflated with viscoelastic, checked, and found to be intact.  The intraocular lens was inserted into the capsular bag.  The irrigation/aspiration handpiece was used to remove any remaining viscoelastic.  The clear corneal wound and paracentesis wounds were then hydrated and checked with Weck-Cels to be watertight. Maxitrol drops were instilled into the operative eye.  The lid-speculum was removed.  The drape was removed.  The patient's face was cleaned with a wet and dry 4x4. A clear shield was taped over the eye. The patient was taken to the post-operative care unit in good condition, having tolerated the procedure well.  Post-Op Instructions: The patient will follow up at [facility] for a same day post-operative evaluation and will receive all other orders and instructions.

## 2023-05-01 ENCOUNTER — Encounter (HOSPITAL_COMMUNITY): Payer: Self-pay | Admitting: Ophthalmology

## 2023-05-17 ENCOUNTER — Ambulatory Visit (INDEPENDENT_AMBULATORY_CARE_PROVIDER_SITE_OTHER): Payer: Commercial Managed Care - PPO

## 2023-05-17 DIAGNOSIS — J4550 Severe persistent asthma, uncomplicated: Secondary | ICD-10-CM | POA: Diagnosis not present

## 2023-06-14 ENCOUNTER — Ambulatory Visit (INDEPENDENT_AMBULATORY_CARE_PROVIDER_SITE_OTHER): Payer: Commercial Managed Care - PPO

## 2023-06-14 DIAGNOSIS — J4550 Severe persistent asthma, uncomplicated: Secondary | ICD-10-CM | POA: Diagnosis not present

## 2023-06-28 ENCOUNTER — Ambulatory Visit: Payer: Commercial Managed Care - PPO | Admitting: Allergy & Immunology

## 2023-07-15 ENCOUNTER — Ambulatory Visit (INDEPENDENT_AMBULATORY_CARE_PROVIDER_SITE_OTHER): Payer: Commercial Managed Care - PPO

## 2023-07-15 DIAGNOSIS — J4550 Severe persistent asthma, uncomplicated: Secondary | ICD-10-CM | POA: Diagnosis not present

## 2023-07-18 ENCOUNTER — Other Ambulatory Visit: Payer: Self-pay | Admitting: Allergy & Immunology

## 2023-08-09 ENCOUNTER — Encounter: Payer: Self-pay | Admitting: Allergy & Immunology

## 2023-08-09 ENCOUNTER — Ambulatory Visit: Payer: Commercial Managed Care - PPO | Admitting: Allergy & Immunology

## 2023-08-09 VITALS — BP 114/70 | HR 100 | Temp 98.7°F | Resp 16 | Ht 59.84 in | Wt 111.4 lb

## 2023-08-09 DIAGNOSIS — J331 Polypoid sinus degeneration: Secondary | ICD-10-CM | POA: Diagnosis not present

## 2023-08-09 DIAGNOSIS — J4550 Severe persistent asthma, uncomplicated: Secondary | ICD-10-CM | POA: Diagnosis not present

## 2023-08-09 DIAGNOSIS — J3089 Other allergic rhinitis: Secondary | ICD-10-CM | POA: Diagnosis not present

## 2023-08-09 MED ORDER — AZELASTINE HCL 0.1 % NA SOLN: NASAL | 5 refills | Status: DC

## 2023-08-09 MED ORDER — TRELEGY ELLIPTA 200-62.5-25 MCG/ACT IN AEPB: 1.0000 | INHALATION_SPRAY | Freq: Every day | RESPIRATORY_TRACT | 5 refills | Status: DC

## 2023-08-09 NOTE — Patient Instructions (Addendum)
1. COPD with asthma overlap syndrome - Lung function looks a bit better (up to 50%).  - Let's change you to Tezspire instead of Nucala to see if that helps. - We will have a sample for you on Monday.  - Trelegy sample provided instead of Breztri (ONE PUFF ONCE DAILY).  - As a reminder: you had the genetic changes that make you predisposed to complications from alpha one antitrypsin deficiency - This is made even worse by smoking since the cigarette smoke deactivates the enzyme that is working in your lungs. - If you can STOP smoking, we could get you on alpha tryptase replacement therapy, which will STABILIZE your lung function.  - Daily controller medication(s): Trelegy one puff once daily + Tezspire monthly - Prior to physical activity: albuterol 2 puffs 10-15 minutes before physical activity. - Rescue medications: albuterol 4 puffs every 4-6 hours as needed - Asthma control goals:  * Full participation in all desired activities (may need albuterol before activity) * Albuterol use two time or less a week on average (not counting use with activity) * Cough interfering with sleep two time or less a month * Oral steroids no more than once a year * No hospitalizations  2. Perennial allergic rhinitis (cat, dog) - Continue with Xhance one spray per nostril daily (I think we have a sample).  - We will stop the allergy shots since you never felt that they did anything. - Continue with: nasal saline rinses  - Continue with: Allegra 180 mg once a day as needed for runny nose/itching - Continue with: Astelin twice daily as needed  3. Return in about 4 months (around 12/09/2023).    Please inform us of any Emergency Department visits, hospitalizations, or changes in symptoms. Call us before going to the ED for breathing or allergy symptoms since we might be able to fit you in for a sick visit. Feel free to contact us anytime with any questions, problems, or concerns.  It was a pleasure to see you  again today! GIVE UPDATES when you get your shots!   Websites that have reliable patient information: 1. American Academy of Asthma, Allergy, and Immunology: www.aaaai.org 2. Food Allergy Research and Education (FARE): foodallergy.org 3. Mothers of Asthmatics: http://www.asthmacommunitynetwork.org 4. American College of Allergy, Asthma, and Immunology: www.acaai.org   COVID-19 Vaccine Information can be found at: [email or web address] For questions related to vaccine distribution or appointments, please email [email or web address] or call [phone].   We realize that you might be concerned about having an allergic reaction to the COVID19 vaccines. To help with that concern, WE ARE OFFERING THE COVID19 VACCINES IN OUR OFFICE! Ask the front desk for dates!     "Like" [place] on Facebook and Instagram for our latest updates!      A healthy democracy works best when [employer] participate! Make sure you are registered to vote! If you have moved or changed any of your contact information, you will need to get this updated before voting!  In some cases, you MAY be able to register to vote online: [email or web address]

## 2023-08-09 NOTE — Progress Notes (Unsigned)
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 COPD with asthma overlap syndrome - Lung function looks a bit better (up to 50%).  - Let's change you to Tezspire instead of Nucala to see if that helps. - We will have a sample for you on Monday.  - Trelegy sample provided instead of Breztri (ONE PUFF ONCE DAILY).  - As a reminder: you had the genetic changes that make you predisposed to complications from alpha one antitrypsin deficiency - This is made even worse by smoking since the cigarette smoke deactivates the enzyme that is working in your lungs. - If you can STOP smoking, we could get you on alpha tryptase replacement therapy, which will STABILIZE your lung function.  - Daily controller medication(s): Trelegy one puff once daily + Tezspire monthly - Prior to physical activity: albuterol 2 puffs 10-15 minutes before physical activity. - Rescue medications: albuterol 4 puffs every 4-6 hours as needed - Asthma control goals:  * Full participation in all desired activities (may need albuterol before activity) * Albuterol use two time or less a week on average (not counting use with activity) * Cough interfering with sleep two time or less a month * Oral steroids no more than once a year * No hospitalizations  2. Perennial allergic rhinitis (cat, dog) - Continue with Xhance one  spray per nostril daily (I think we have a sample).  - We will stop the allergy shots since you never felt that they did anything. - Continue with: nasal saline rinses  - Continue with: Allegra 180 mg once a day as needed for runny nose/itching - Continue with: Astelin twice daily as needed  3. Return in about 4 months (around 12/09/2023).   Subjective:   Christina Esparza is a 62 y.o. female presenting today for follow up of  Chief Complaint  Patient presents with   Follow-up    Is having SOB. Is wondering if the nucala is making her breathing worse. Feels like the breztri is not helping. Wants to know if there is something else she can try. Wants to pinpoint what can be causing her SOB    [name] has a history of the following: Patient Active Problem List   Diagnosis Date Noted   Severe persistent asthma with acute exacerbation 06/24/2022   Dysfunction of both eustachian tubes 06/24/2022   Perennial allergic rhinitis 06/24/2022   Tobacco use 06/24/2022   Low back pain 04/05/2021   Peroneal neuropathy, right 04/05/2021   Carpal tunnel syndrome on right 03/23/2021   Primary osteoarthritis of both first carpometacarpal joints 03/23/2021   Right wrist pain 03/23/2021   Pain in right hip 12/28/2020   Asthma-COPD overlap syndrome 09/14/2020   Osteoarthritis cervical spine 05/04/2020   Gastroesophageal reflux disease 10/22/2019   GERD (gastroesophageal reflux disease) 10/21/2019   Shortness of breath 10/21/2019  Primary osteoarthritis of first carpometacarpal joint of right hand 09/17/2017   Anxiety state 08/24/2015    History obtained from: chart review and patient.  Christina Esparza is a 63 y.o. female presenting for a follow up visit.  She was last seen in May 2024.  At that time, her lung function looked a bit worse but it had been worse in the past.  We continued her on Nucala monthly.  We talked about her alpha 1 antitrypsin deficiency and the fact that she is an MZ phenotype.   However, we cannot do replacement unless she stopped smoking.  We continued our Breztri 2 puffs twice daily as well as albuterol as needed.  For her rhinitis, we continued with Xhance 1 spray per nostril daily.  We stopped her allergy shots since she never felt that they did anything.  We continue with Allegra as well as Astelin.  Since last visit, she reports that she has a lot of problems.   She has been having issues for the last 1.5 years or so.   Asthma/Respiratory Symptom History: She is concerned that the Nucala is causing some tightness in her chest.  She says that she goes to sleep with COPD on her mind. She is seeing her counselor and has the chronic stress. Ever since last year she has been taken off of medication that she has been on for a year.  They are still in the process of getting her off of Cymbalta and she is moving up on her Prozac 50mg  daily. She asks again today whether her anxiety is playing a role with her breathing and I once again assert that I know that it is. She remains optimistic that the medications are going to eventually get her feelings under control.  Her husband is still working away from the home 5 days a week at [employer].  They are replacing all of the prior sprinklers and all of the buildings.  I think it is going to be another couple of years until he is back home full-time.  He does come home on the weekends.  She is having some problems with her eyes. She was diagnosed with cataracts. She did have the cataract surgery done (1 9 was done in May 2024 and the other eye was done in June 2024). She was told that she has dry eye disease. She is currently on Xidra drops. It took a lot of work to gets these approved, but it seems to be working very well to control her symptoms.   Allergic Rhinitis Symptom History: She did feel that Timmothy Sours was helping her symptoms. She does not think that her insurance every covered it well if at all.  But she did think that this was  working well to control her symptoms. She was not on antibiotics at all for her symptoms. She is unsure whether she needs to get back on the allergy shots. Now that she is off of them, she feels that they animals might be making her breathing WORSE without the allergy shots on board.   Otherwise, there have been no changes to her past medical history, surgical history, family history, or social history.    Review of systems otherwise negative other than that mentioned in the HPI.    Objective:   Blood pressure 114/70, pulse 100, temperature 98.7 F (37.1 C), resp. rate 16, height 4' 11.84" (1.52 m), weight 111 lb 6 oz (50.5 kg), SpO2 97%. Body mass index is 21.87 kg/m.  Physical Exam Vitals reviewed.  Constitutional:      Appearance: She is well-developed.     Comments: Anxious.  No tears today.   HENT:     Head: Normocephalic and atraumatic.     Right Ear: Tympanic membrane, ear canal and external ear normal.     Left Ear: Tympanic membrane, ear canal and external ear normal.     Nose: No nasal deformity, septal deviation, mucosal edema or rhinorrhea.     Right Turbinates: Enlarged, swollen and pale.     Left Turbinates: Enlarged, swollen and pale.     Right Sinus: No maxillary sinus tenderness or frontal sinus tenderness.     Left Sinus: No maxillary sinus tenderness or frontal sinus tenderness.     Comments: Scant clear rhinorrhea.. No polyps.    Mouth/Throat:     Mouth: Mucous membranes are not pale and not dry.     Pharynx: Uvula midline.  Eyes:     General: Lids are normal. Allergic shiner present.        Right eye: No discharge.        Left eye: No discharge.     Conjunctiva/sclera: Conjunctivae normal.     Right eye: Right conjunctiva is not injected. No chemosis.    Left eye: Left conjunctiva is not injected. No chemosis.    Pupils: Pupils are equal, round, and reactive to light.  Cardiovascular:     Rate and Rhythm: Normal rate and regular rhythm.     Heart  sounds: Normal heart sounds.  Pulmonary:     Effort: Pulmonary effort is normal. No tachypnea, accessory muscle usage or respiratory distress.     Breath sounds: Examination of the right-upper field reveals wheezing and rhonchi. Examination of the left-upper field reveals wheezing and rhonchi. Examination of the right-middle field reveals wheezing and rhonchi. Examination of the left-middle field reveals wheezing and rhonchi. Wheezing and rhonchi present. No rales.     Comments: Moving air well in upper fields.  Anxious.  Chest:     Chest wall: No tenderness.  Lymphadenopathy:     Cervical: No cervical adenopathy.  Skin:    General: Skin is warm.     Capillary Refill: Capillary refill takes less than 2 seconds.     Coloration: Skin is not pale.     Findings: No abrasion, erythema, petechiae or rash. Rash is not papular, urticarial or vesicular.  Neurological:     Mental Status: She is alert.  Psychiatric:        Behavior: Behavior is cooperative.      Diagnostic studies:    Spirometry: results abnormal (FEV1: 1.00/50%, FVC: 2.06/81%, FEV1/FVC: 49%).    Spirometry consistent with severe obstructive disease. This is the best that we have seen her in a while. But has been much higher in the past as well.    Allergy Studies: none        Malachi Bonds, MD  [facility]

## 2023-08-12 ENCOUNTER — Ambulatory Visit (INDEPENDENT_AMBULATORY_CARE_PROVIDER_SITE_OTHER): Payer: Commercial Managed Care - PPO

## 2023-08-12 DIAGNOSIS — J4550 Severe persistent asthma, uncomplicated: Secondary | ICD-10-CM

## 2023-08-12 MED ORDER — TEZEPELUMAB-EKKO 210 MG/1.91ML ~~LOC~~ SOSY
210.0000 mg | PREFILLED_SYRINGE | SUBCUTANEOUS | Status: AC
Start: 2023-08-12 — End: ?
  Administered 2023-08-12 – 2023-09-11 (×2): 210 mg via SUBCUTANEOUS

## 2023-08-12 NOTE — Addendum Note (Signed)
Addended by: Elsworth Soho on: 08/12/2023 05:12 PM   Modules accepted: Orders

## 2023-08-12 NOTE — Progress Notes (Signed)
Immunotherapy   Patient Details  Name: Christina Esparza MRN: [id number] Date of Birth: [birth year]  08/12/2023  [name] started injections for  Tezspire. Following schedule: Every twenty eight days.  Frequency: Every four weeks.  Epi-Pen:Epi-Pen Available but not needed for this injection.  Consent signed previously and patient instructions given. Patient sat in her car for thirty minute without an issue.    Ralene Muskrat 08/12/2023, 10:37 AM

## 2023-08-13 ENCOUNTER — Telehealth: Payer: Self-pay | Admitting: *Deleted

## 2023-08-13 NOTE — Telephone Encounter (Signed)
Seen where patient changed over to Tezspire from [place] when I looked at note for PA renewal. Called patient and advised approval, copay card and submit to [facility] for same. She wants to initially get injs in clinic and may change over to home admin

## 2023-08-16 NOTE — Telephone Encounter (Signed)
Sounds good thank you

## 2023-08-20 NOTE — Telephone Encounter (Signed)
L/m for patient to call to make appt to start Tezspire delivery 10/3

## 2023-09-04 NOTE — Telephone Encounter (Signed)
Patient called and is scheduled for 09/11/2023 to do her next Tezspire. Patient is going to bring her sister as she is interested in doing the injections at home. Please have medications sent to patients home.

## 2023-09-04 NOTE — Telephone Encounter (Signed)
L/m for patient she can order her next Tezspire for home from [facility] and given instructions on delivery and storage of same

## 2023-09-11 ENCOUNTER — Ambulatory Visit: Payer: Commercial Managed Care - PPO

## 2023-09-11 DIAGNOSIS — J4550 Severe persistent asthma, uncomplicated: Secondary | ICD-10-CM | POA: Diagnosis not present

## 2023-09-11 NOTE — Progress Notes (Signed)
Immunotherapy   Patient Details  Name: Christina Esparza MRN: [id number] Date of Birth: November 07, 1961  09/11/2023  Genevive Bi Traweek started injections for  Tezspire home administration.  Following schedule: Every twenty eight days.  Frequency:Every four weeks Epi-Pen:Not needed.  Consent signed previously and patient instructions given on how to properly inject Tezspire into the thighs, abdomen, and arms. Patients sister successfully injected the Tezspire into her right upper arm. Patient and her sister sat in her car for twenty minutes without an issue.    Ralene Muskrat 09/11/2023, 10:37 AM

## 2023-09-19 ENCOUNTER — Telehealth: Payer: Self-pay | Admitting: Allergy & Immunology

## 2023-09-19 NOTE — Telephone Encounter (Signed)
Patient has been using the trelegy as of last visit when switch was made to trelegy. When first taking the trelegy, patient rinsed her mouth out as she is supposed to but her throat has gotten so raw. Patient has a really dry cough and has gotten hoarse. Patient is requesting an alternative, she states its awful.   [facility] - [address] [place] [address]  Best contact number: [phone]

## 2023-09-20 NOTE — Telephone Encounter (Signed)
I called and spoke with the patient, unfortunately there are no more openings today or the rest of next week in [place] to have her come in. She did state that the sides of her throat are really red and agitated from the Trelegy and she is a little hoarse. She did state that she has been rinsing her mouth out after use. She did have Breztri at home still. I advise to go ahead and switch back to the [place] for now and that I would forward this message to you to get further recommendation. She is wondering if something can be sent in to help her throat. She denies any white patches, just redness.

## 2023-09-22 ENCOUNTER — Other Ambulatory Visit: Payer: Self-pay | Admitting: Allergy & Immunology

## 2023-09-24 NOTE — Telephone Encounter (Signed)
Called and left a voicemail to see how the patient was doing and to get her scheduled to come in and be evaluated in [place].

## 2023-09-24 NOTE — Telephone Encounter (Signed)
Can you please have her make an appointment to be seen in the clinic? Thank yiu

## 2023-09-27 NOTE — Progress Notes (Unsigned)
Name: Christina Esparza DOB: 1961/02/15 MRN: [id number]  History of Present Illness: Christina Esparza is a 62 y.o. female who presents today as a new patient at [facility]. All available relevant medical records have been reviewed.   Today: She reports chief complaint of bothersome urinary urgency, urge incontinence, and nocturnal enuresis a few times per week over the past year. Denies stress incontinence. Wears 1-2 pull-up per day on average.   She denies increased urinary frequency, dysuria, gross hematuria, hesitancy, straining to void, or sensations of incomplete emptying. She denies flank pain or abdominal pain.  She reports significant caffeine intake ("that's all I drink").   She denies prior history of gross hematuria.  She denies history of kidney stones.  She denies history of pyelonephritis.  She denies history of recent or recurrent UTI. She denies history of GU malignancy or pelvic radiation.  She denies history of autoimmune disease. She reports history of smoking (has smoked 1 ppd x20 years). She denies taking anticoagulants.   Fall Screening: Do you usually have a device to assist in your mobility? No   Medications: Current Outpatient Medications  Medication Sig Dispense Refill   albuterol (PROVENTIL) 2 MG tablet Take 2 mg by mouth 3 (three) times daily.     ALPRAZolam (XANAX) 1 MG tablet Take 1 mg by mouth 3 (three) times daily as needed for anxiety.     azelastine (ASTELIN) 0.1 % nasal spray PLACE 1 SPRAY INTO BOTH NOSTRILS TWICE DAILY 30 mL 5   benzonatate (TESSALON) 200 MG capsule Take 200 mg by mouth 3 (three) times daily.     BREZTRI AEROSPHERE 160-9-4.8 MCG/ACT AERO INHALE 2 PUFFS INTO THE LUNGS IN THE MORNING AND AT BEDTIME 10.7 g 1   cromolyn (OPTICROM) 4 % ophthalmic solution Place 2 drops into both eyes 4 (four) times daily. 10 mL 12   cyclobenzaprine (FLEXERIL) 10 MG tablet TK 1 T PO Q HS PRN     doxycycline (VIBRAMYCIN) 100 MG capsule  Take 100 mg by mouth 2 (two) times daily.     EPINEPHrine (AUVI-Q) 0.3 mg/0.3 mL IJ SOAJ injection Inject 0.3 mg into the muscle as needed for anaphylaxis. 2 each 1   FLUoxetine (PROZAC) 10 MG capsule Take 20 mg by mouth.     FLUoxetine (PROZAC) 40 MG capsule Take 40 mg by mouth daily.     Fluticasone Propionate (XHANCE) 93 MCG/ACT EXHU Place 1 spray into the nose daily. 16 mL 3   Fluticasone-Umeclidin-Vilant (TRELEGY ELLIPTA) 200-62.5-25 MCG/ACT AEPB Inhale 1 puff into the lungs daily. 28 each 5   hydrOXYzine (ATARAX/VISTARIL) 25 MG tablet TAKE 1 TABLET(25 MG) BY MOUTH THREE TIMES DAILY AS NEEDED FOR ANXIETY OR ALLERGIES 60 tablet 3   metoCLOPramide (REGLAN) 10 MG tablet Take 10 mg by mouth every 8 (eight) hours as needed.     mirtazapine (REMERON) 15 MG tablet Take 15 mg by mouth at bedtime.     montelukast (SINGULAIR) 10 MG tablet Take 10 mg by mouth daily.     moxifloxacin (VIGAMOX) 0.5 % ophthalmic solution Apply 1 drop to eye 3 (three) times daily.     ondansetron (ZOFRAN) 4 MG tablet Take 4 mg by mouth 4 (four) times daily as needed.     prednisoLONE acetate (PRED FORTE) 1 % ophthalmic suspension SMARTSIG:In Eye(s)     promethazine (PHENERGAN) 12.5 MG tablet Take 1 tablet by mouth every 8 (eight) hours as needed.     VENTOLIN HFA 108 (90 Base)  MCG/ACT inhaler Inhale 2 puffs into the lungs every 4 (four) hours as needed for wheezing or shortness of breath. 18 g 1   Vitamin D, Ergocalciferol, (DRISDOL) 1.25 MG (50000 UNIT) CAPS capsule Take 50,000 Units by mouth once a week.     XIIDRA 5 % SOLN SMARTSIG:1 Drop(s) In Eye(s) Every 12 Hours     DULoxetine (CYMBALTA) 30 MG capsule Take 30 mg by mouth daily. (Patient not taking: Reported on 10/02/2023)     DULoxetine (CYMBALTA) 60 MG capsule Take 60 mg by mouth daily. (Patient not taking: Reported on 10/02/2023)     famotidine (PEPCID) 40 MG tablet Take 1 tablet (40 mg total) by mouth daily. 30 tablet 5   formoterol (PERFOROMIST) 20 MCG/2ML  nebulizer solution Take 2 mLs (20 mcg total) by nebulization 2 (two) times daily. 120 mL 5   pantoprazole (PROTONIX) 40 MG tablet Take 40 mg by mouth daily. (Patient not taking: Reported on 10/02/2023)     sertraline (ZOLOFT) 50 MG tablet Take 50 mg by mouth daily. (Patient not taking: Reported on 10/02/2023)     Current Facility-Administered Medications  Medication Dose Route Frequency Provider Last Rate Last Admin   tezepelumab-ekko (TEZSPIRE) 210 MG/1. syringe 210 mg  210 mg Subcutaneous Q28 days Alfonse Spruce, MD   210 mg at 09/11/23 1044    Allergies: Allergies  Allergen Reactions   Omeprazole Other (See Comments)   Amitiza [Lubiprostone] Itching    Patient states that she experienced a pin sticking sensation, prickling all over.   Linaclotide Nausea Only   Cat Hair Extract    Ceftin [Cefuroxime] Itching   Codeine Nausea And Vomiting   Levaquin [Levofloxacin In D5w]     dream   Penicillin G    Penicillins Other (See Comments)    Felt like she was choking Did it involve swelling of the face/tongue/throat, SOB, or low BP? Unknown Did it involve sudden or severe rash/hives, skin peeling, or any reaction on the inside of your mouth or nose? No Did you need to seek medical attention at a hospital or doctor's office? No When did it last happen?~10 years ago If all above answers are "NO", may proceed with cephalosporin use.  Felt like she was choking Felt like she was choking Did it involve swelling of the face/tongue/throat, SOB, or low BP? Unknown Did it involve sudden or severe rash/hives, skin peeling, or any reaction on the inside of your mouth or nose? No Did you need to seek medical attention at a hospital or doctor's office? No When did it last happen?~10 years ago If all above answers are "NO", may proceed with cephalosporin use.     Past Medical History:  Diagnosis Date   Anxiety    Asthma    Depression    Eczema    Urticaria    Past Surgical  History:  Procedure Laterality Date   CATARACT EXTRACTION W/PHACO Left 04/01/2023   Procedure: CATARACT EXTRACTION PHACO AND INTRAOCULAR LENS PLACEMENT (IOC);  Surgeon: [name], MD;  Location: [facility];  Service: Ophthalmology;  Laterality: Left;  CDE: 9.02   CATARACT EXTRACTION W/PHACO Right 04/29/2023   Procedure: CATARACT EXTRACTION PHACO AND INTRAOCULAR LENS PLACEMENT (IOC);  Surgeon: [name], MD;  Location: [facility];  Service: Ophthalmology;  Laterality: Right;  CDE: 6.45   COLONOSCOPY N/A 06/17/2013   Procedure: COLONOSCOPY;  Surgeon: Malissa Hippo, MD;  Location: [facility];  Service: Endoscopy;  Laterality: N/A;  730   ESOPHAGEAL BRUSHING  11/18/2019   Procedure: ESOPHAGEAL BRUSHING;  Surgeon: Malissa Hippo, MD;  Location: [facility];  Service: Endoscopy;;   ESOPHAGEAL DILATION N/A 11/15/2015   Procedure: ESOPHAGEAL DILATION;  Surgeon: Malissa Hippo, MD;  Location: [facility];  Service: Endoscopy;  Laterality: N/A;   ESOPHAGOGASTRODUODENOSCOPY N/A 11/15/2015   Procedure: ESOPHAGOGASTRODUODENOSCOPY (EGD);  Surgeon: Malissa Hippo, MD;  [facility]: [facility];  Service: Endoscopy;  Laterality: N/A;   ESOPHAGOGASTRODUODENOSCOPY N/A 11/18/2019   Procedure: ESOPHAGOGASTRODUODENOSCOPY (EGD);  Surgeon: Malissa Hippo, MD;  Location: [facility];  Service: Endoscopy;  Laterality: N/A;  2:25   NASAL SEPTOPLASTY W/ TURBINOPLASTY Bilateral 11/18/2018   Procedure: NASAL SEPTOPLASTY WITH BILATERAL TURBINATE REDUCTION;  Surgeon: Newman Pies, MD;  Location: [facility];  Service: ENT;  Laterality: Bilateral;   TUBAL LIGATION     Family History  Problem Relation Age of Onset   Colon polyps Mother    Prostate cancer Father    Heart attack Father    Colon cancer Neg Hx    Social History   Socioeconomic History   Marital status: Married    Spouse name: Not on file   Number of children: Not on file   Years of education: Not on file   Highest education  level: Not on file  Occupational History   Not on file  Tobacco Use   Smoking status: Every Day    Current packs/day: 1.00    Average packs/day: 1 pack/day for 20.0 years (20.0 ttl pk-yrs)    Types: Cigarettes   Smokeless tobacco: Never   Tobacco comments:    Currently smokes a pack a day [facility] 06/19/21  Vaping Use   Vaping status: Never Used  Substance and Sexual Activity   Alcohol use: No    Alcohol/week: 0.0 standard drinks of alcohol   Drug use: No   Sexual activity: Not on file  Other Topics Concern   Not on file  Social History Narrative   Not on file   Social Determinants of Health   Financial Resource Strain: Not on file  Food Insecurity: Not on file  Transportation Needs: Not on file  Physical Activity: Not on file  Stress: Not on file  Social Connections: Not on file  Intimate Partner Violence: Not At Risk (09/17/2023)   [facility]   Humiliation, Afraid, Rape, and Kick questionnaire    Fear of Current or Ex-Partner: No    Emotionally Abused: No    Physically Abused: No    Sexually Abused: No    SUBJECTIVE  Review of Systems Constitutional: Patient denies any unintentional weight loss or change in strength lntegumentary: Patient denies any rashes or pruritus Cardiovascular: Patient denies chest pain or syncope Respiratory: Patient denies shortness of breath Gastrointestinal: Patient denies nausea, vomiting, constipation, or diarrhea Musculoskeletal: Patient denies muscle cramps or weakness Neurologic: Patient denies convulsions or seizures Allergic/Immunologic: Patient denies recent allergic reaction(s) Hematologic/Lymphatic: Patient denies bleeding tendencies Endocrine: Patient denies heat/cold intolerance  GU: As per HPI.  OBJECTIVE Vitals:   10/02/23 1054  BP: 108/70  Pulse: 99  Temp: 98.7 F (37.1 C)   Body mass index is 21.35 kg/m.  Physical Examination Constitutional: No obvious distress; patient is non-toxic appearing   Cardiovascular: No visible lower extremity edema.  Respiratory: The patient does not have audible wheezing/stridor; respirations do not appear labored  Gastrointestinal: Abdomen non-distended Musculoskeletal: Normal ROM of UEs  Skin: No obvious rashes/open sores  Neurologic: CN 2-12 grossly intact Psychiatric:  Answered questions appropriately with normal affect  Hematologic/Lymphatic/Immunologic: No obvious bruises or sites of spontaneous bleeding  UA: 3-10 RBC/hpf, no evidence of UTI  PVR: 0 ml  ASSESSMENT Urge incontinence - Plan: Urinalysis, Routine w reflex microscopic, BLADDER SCAN AMB NON-IMAGING  Microscopic hematuria - Plan: CT HEMATURIA WORKUP  Tobacco use - Plan: CT HEMATURIA WORKUP  Caffeine use  For asymptomatic microscopic hematuria we discussed possible etiologies including but not limited to: vigorous exercise, sexual activity, stone, trauma, blood thinner use, urinary tract infection, urethral irritation secondary to vaginal atrophy, chronic kidney disease, glomerulonephropathy, malignancy. We discussed pt's smoking as a risk factor for GU cancer and encouraged smoking cessation.  We reviewed the AUA 2020 [facility] guideline and risk stratification for this patient. Based on individual risk factors, pt was advised that the recommended workup includes CT urogram and cystoscopy.   We discussed etiology for her voiding symptoms such as OAB, neurogenic bladder, irritative bladder stone or mass. Likely exacerbated by her substantial caffeine intake. Discussed management options including gradual reduction of caffeine intake and/or OAB-type medication(s). Agreed to proceed with behavioral modification at this time while proceeding with CT & cystoscopy for further evaluation.   Pt verbalized understanding and agreement. All questions were answered.  PLAN Advised the following: 1. CT.  2. Gradually minimize caffeine intake. 3. Return for 1st available cystoscopy with any urology  MD.  Orders Placed This Encounter  Procedures   CT HEMATURIA WORKUP    Standing Status:   Future    Standing Expiration Date:   10/01/2024    Order Specific Question:   Reason for Exam (SYMPTOM  OR DIAGNOSIS REQUIRED)    Answer:   Microscopic hematuria    Order Specific Question:   Preferred imaging location?    Answer:   [facility]   Urinalysis, Routine w reflex microscopic   BLADDER SCAN AMB NON-IMAGING    It has been explained that the patient is to follow regularly with their PCP in addition to all other providers involved in their care and to follow instructions provided by these respective offices. Patient advised to contact urology clinic if any urologic-pertaining questions, concerns, new symptoms or problems arise in the interim period.  There are no Patient Instructions on file for this visit.  Electronically signed by:  Donnita Falls, MSN, FNP-C, CUNP 10/02/2023 11:39 AM

## 2023-10-02 ENCOUNTER — Ambulatory Visit (INDEPENDENT_AMBULATORY_CARE_PROVIDER_SITE_OTHER): Payer: Commercial Managed Care - PPO | Admitting: Urology

## 2023-10-02 ENCOUNTER — Encounter: Payer: Self-pay | Admitting: Urology

## 2023-10-02 VITALS — BP 108/70 | HR 99 | Temp 98.7°F | Ht 61.0 in | Wt 113.0 lb

## 2023-10-02 DIAGNOSIS — N3281 Overactive bladder: Secondary | ICD-10-CM

## 2023-10-02 DIAGNOSIS — N3941 Urge incontinence: Secondary | ICD-10-CM | POA: Diagnosis not present

## 2023-10-02 DIAGNOSIS — R3129 Other microscopic hematuria: Secondary | ICD-10-CM | POA: Diagnosis not present

## 2023-10-02 DIAGNOSIS — Z789 Other specified health status: Secondary | ICD-10-CM | POA: Diagnosis not present

## 2023-10-02 DIAGNOSIS — Z720 Tobacco use: Secondary | ICD-10-CM

## 2023-10-02 LAB — URINALYSIS, ROUTINE W REFLEX MICROSCOPIC
Bilirubin, UA: NEGATIVE
Glucose, UA: NEGATIVE
Ketones, UA: NEGATIVE
Leukocytes,UA: NEGATIVE
Nitrite, UA: NEGATIVE
Specific Gravity, UA: 1.03 (ref 1.005–1.030)
Urobilinogen, Ur: 0.2 mg/dL (ref 0.2–1.0)
pH, UA: 5.5 (ref 5.0–7.5)

## 2023-10-02 LAB — MICROSCOPIC EXAMINATION: Bacteria, UA: NONE SEEN

## 2023-10-02 NOTE — Progress Notes (Signed)
post void residual=0 ?

## 2023-10-14 ENCOUNTER — Other Ambulatory Visit: Payer: Self-pay

## 2023-10-14 ENCOUNTER — Encounter (HOSPITAL_COMMUNITY): Payer: Self-pay | Admitting: Radiology

## 2023-10-14 ENCOUNTER — Emergency Department (HOSPITAL_COMMUNITY)
Admission: EM | Admit: 2023-10-14 | Discharge: 2023-10-15 | Disposition: A | Discharge status: Home / Self Care | Payer: Commercial Managed Care - PPO | Attending: Emergency Medicine | Admitting: Emergency Medicine

## 2023-10-14 ENCOUNTER — Emergency Department (HOSPITAL_COMMUNITY): Payer: Commercial Managed Care - PPO

## 2023-10-14 DIAGNOSIS — R42 Dizziness and giddiness: Secondary | ICD-10-CM | POA: Insufficient documentation

## 2023-10-14 DIAGNOSIS — F17210 Nicotine dependence, cigarettes, uncomplicated: Secondary | ICD-10-CM | POA: Insufficient documentation

## 2023-10-14 DIAGNOSIS — J449 Chronic obstructive pulmonary disease, unspecified: Secondary | ICD-10-CM | POA: Diagnosis not present

## 2023-10-14 LAB — CBC
HCT: 34 % — ABNORMAL LOW (ref 36.0–46.0)
Hemoglobin: 10.9 g/dL — ABNORMAL LOW (ref 12.0–15.0)
MCH: 29.9 pg (ref 26.0–34.0)
MCHC: 32.1 g/dL (ref 30.0–36.0)
MCV: 93.4 fL (ref 80.0–100.0)
Platelets: 332 10*3/uL (ref 150–400)
RBC: 3.64 MIL/uL — ABNORMAL LOW (ref 3.87–5.11)
RDW: 16.7 % — ABNORMAL HIGH (ref 11.5–15.5)
WBC: 5.4 10*3/uL (ref 4.0–10.5)
nRBC: 0 % (ref 0.0–0.2)

## 2023-10-14 LAB — BASIC METABOLIC PANEL
Anion gap: 11 (ref 5–15)
BUN: 23 mg/dL (ref 8–23)
CO2: 20 mmol/L — ABNORMAL LOW (ref 22–32)
Calcium: 8.2 mg/dL — ABNORMAL LOW (ref 8.9–10.3)
Chloride: 108 mmol/L (ref 98–111)
Creatinine, Ser: 1.06 mg/dL — ABNORMAL HIGH (ref 0.44–1.00)
GFR, Estimated: 59 mL/min — ABNORMAL LOW (ref >60)
Glucose, Bld: 78 mg/dL (ref 70–99)
Potassium: 3.9 mmol/L (ref 3.5–5.1)
Sodium: 139 mmol/L (ref 135–145)

## 2023-10-14 NOTE — ED Triage Notes (Signed)
Pt family states they spoke to her at 10am and she was normal. Around 6:30pm today pt family went to her home and found her on the ground trying to get back up and on the golf cart. Pt seems very disengaged in triage unable to give history. Pt was outside putting up christmas lights and states every time she stepped back she got dizzy and disoriented Pt hit head on the fireplace 2 nights ago after stumbling in the home. Pt states she doesn't have any pain just doesn't feel well.

## 2023-10-14 NOTE — ED Notes (Signed)
Pt placed on cardiac monitoring.  

## 2023-10-14 NOTE — ED Notes (Signed)
Pt aware we need urine specimen.  

## 2023-10-14 NOTE — ED Provider Notes (Signed)
[place] EMERGENCY DEPARTMENT AT [facility] Provider Note  CSN: [id number] Arrival date & time: 10/14/23 1851  Chief Complaint(s) Fall and Altered Mental Status  HPI Christina Esparza is a 62 y.o. female history of COPD, depression presenting to the emergency department with episode of altered mental status.  Patient family last saw her at 83 AM and she seemed normal.  Around 60, her family went to her home and found her trying to climb on a golf cart, apparently was kind of disoriented and drooling out of part of her mouth.  Did not seem to be weak on one side of her body or the other.  No clear loss of consciousness.  By the time she arrived in the emergency department she seemed back to normal per family.  They do note that she hit her head on the fireplace a couple days ago.  Patient denies any loss of consciousness.  Since then she has improved.  Denies any other new specific symptoms like chest pain, abdominal pain, numbness or tingling, visual disturbance, fevers or chills, difficulty breathing.   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 Authorizing Provider  ALPRAZolam Prudy Feeler) 1 MG tablet Take 1 mg by mouth 3 (three) times daily as needed for anxiety. 10/29/19  Yes [provider]  azelastine (ASTELIN) 0.1 % nasal spray PLACE 1 SPRAY INTO BOTH NOSTRILS TWICE DAILY 08/09/23  Yes Alfonse Spruce, MD  EPINEPHrine (AUVI-Q) 0.3 mg/0.3 mL IJ SOAJ injection Inject 0.3 mg into the muscle as needed for anaphylaxis. 02/21/22  Yes Alfonse Spruce, MD  FLUoxetine (PROZAC) 20 MG capsule Take 20 mg by mouth daily. 10/07/23  Yes [provider]  mirtazapine (REMERON) 7.5 MG tablet Take 7.5 mg by mouth at bedtime. 10/08/23  Yes [provider]  pantoprazole (PROTONIX) 40 MG tablet Take 40 mg by mouth daily. 02/15/23  Yes [provider]  VENTOLIN HFA 108 (90 Base) MCG/ACT inhaler Inhale 2 puffs into the lungs every 4 (four) hours as needed for wheezing or shortness of breath. 03/20/23  Yes Alfonse Spruce, MD  Past Surgical History Past Surgical History:  Procedure Laterality Date   CATARACT EXTRACTION W/PHACO Left 04/01/2023   Procedure: CATARACT EXTRACTION PHACO AND INTRAOCULAR LENS PLACEMENT (IOC);  Surgeon: [name], MD;  Location: [facility];  Service: Ophthalmology;  Laterality: Left;  CDE: 9.02   CATARACT EXTRACTION W/PHACO Right 04/29/2023   Procedure: CATARACT EXTRACTION PHACO AND INTRAOCULAR LENS PLACEMENT (IOC);  Surgeon: [name], MD;  Location: [facility];  Service: Ophthalmology;  Laterality: Right;  CDE: 6.45   COLONOSCOPY N/A 06/17/2013   Procedure: COLONOSCOPY;  Surgeon: Malissa Hippo, MD;  Location: [facility];  Service: Endoscopy;  Laterality: N/A;  730   ESOPHAGEAL BRUSHING  11/18/2019   Procedure: ESOPHAGEAL BRUSHING;  Surgeon: Malissa Hippo, MD;  Location: [facility];  Service: Endoscopy;;    ESOPHAGEAL DILATION N/A 11/15/2015   Procedure: ESOPHAGEAL DILATION;  Surgeon: Malissa Hippo, MD;  Location: [facility];  Service: Endoscopy;  Laterality: N/A;   ESOPHAGOGASTRODUODENOSCOPY N/A 11/15/2015   Procedure: ESOPHAGOGASTRODUODENOSCOPY (EGD);  Surgeon: Malissa Hippo, MD;  Location: [facility];  Service: Endoscopy;  Laterality: N/A;   ESOPHAGOGASTRODUODENOSCOPY N/A 11/18/2019   Procedure: ESOPHAGOGASTRODUODENOSCOPY (EGD);  Surgeon: Malissa Hippo, MD;  Location: [facility];  Service: Endoscopy;  Laterality: N/A;  2:25   NASAL SEPTOPLASTY W/ TURBINOPLASTY Bilateral 11/18/2018   Procedure: NASAL SEPTOPLASTY WITH BILATERAL TURBINATE REDUCTION;  Surgeon: Newman Pies, MD;  Location: [facility];  Service: ENT;  Laterality: Bilateral;   TUBAL LIGATION     Family History Family History  Problem Relation Age of Onset   Colon polyps Mother    Prostate cancer Father    Heart attack Father    Colon cancer Neg Hx     Social History Social History   Tobacco Use   Smoking status: Every Day    Current packs/day: 1.00    Average packs/day: 1 pack/day for 20.0 years (20.0 ttl pk-yrs)    Types: Cigarettes   Smokeless tobacco: Never   Tobacco comments:    Currently smokes a pack a day [facility] 06/19/21  Vaping Use   Vaping status: Never Used  Substance Use Topics   Alcohol use: No    Alcohol/week: 0.0 standard drinks of alcohol   Drug use: No   Allergies Omeprazole, Amitiza [lubiprostone], Linaclotide, Cat hair extract, Ceftin [cefuroxime], Codeine, Levaquin [levofloxacin in d5w], Penicillin g, Penicillins, and Trelegy ellipta [fluticasone-umeclidin-vilant]  Review of Systems Review of Systems  All other systems reviewed and are negative.   Physical Exam Vital Signs  I have reviewed the triage vital signs BP 95/70   Pulse 89   Temp 98.1 F (36.7 C) (Oral)   Resp 18   Ht 5\' 1"  (1.549 m)   Wt 51.3 kg   SpO2 91%   BMI 21.35 kg/m  Physical Exam Vitals and  nursing note reviewed.  Constitutional:      General: She is not in acute distress.    Appearance: She is well-developed.  HENT:     Head: Normocephalic and atraumatic.     Mouth/Throat:     Mouth: Mucous membranes are moist.  Eyes:     Pupils: Pupils are equal, round, and reactive to light.  Cardiovascular:     Rate and Rhythm: Normal rate and regular rhythm.     Heart sounds: No murmur heard. Pulmonary:     Effort: Pulmonary effort is normal. No respiratory distress.     Breath sounds: Normal breath sounds.  Abdominal:  General: Abdomen is flat.     Palpations: Abdomen is soft.     Tenderness: There is no abdominal tenderness.  Musculoskeletal:        General: No tenderness.     Right lower leg: No edema.     Left lower leg: No edema.  Skin:    General: Skin is warm and dry.  Neurological:     General: No focal deficit present.     Mental Status: She is alert. Mental status is at baseline.     Comments: Cranial nerves II through XII intact, strength 5 out of 5 in the bilateral upper and lower extremities, no sensory deficit to light touch, no dysmetria on finger-nose-finger testing, ambulatory with steady gait.  Psychiatric:        Mood and Affect: Mood normal.        Behavior: Behavior normal.     ED Results and Treatments Labs (all labs ordered are listed, but only abnormal results are displayed) Labs Reviewed  BASIC METABOLIC PANEL - Abnormal; Notable for the following components:      Result Value   CO2 20 (*)    Creatinine, Ser 1.06 (*)    Calcium 8.2 (*)    GFR, Estimated 59 (*)    All other components within normal limits  CBC - Abnormal; Notable for the following components:   RBC 3.64 (*)    Hemoglobin 10.9 (*)    HCT 34.0 (*)    RDW 16.7 (*)    All other components within normal limits  URINALYSIS, ROUTINE W REFLEX MICROSCOPIC                                                                                                                           Radiology No results found.  Pertinent labs & imaging results that were available during my care of the patient were reviewed by me and considered in my medical decision making (see MDM for details).  Medications Ordered in ED Medications - No data to display                                                                                                                                   Procedures Procedures  (including critical care time)  Medical Decision Making / ED Course   MDM:  62 year old presenting with episode of altered mental status.  Patient has now returned to  baseline.  Differential includes stroke, TIA, intracranial bleeding, intracranial mass, toxic or metabolic abnormality.  Laboratory workup is overall reassuring.  Her neurologic exam is currently normal.  Would normally obtain CT head but CT scanner is broken so we will obtain MRI which will thoroughly evaluate for stroke.  Considered TIA but, less likely, it seems that patient's symptoms were mostly confusion rather than any focal weakness or speech disturbances.  Disposition pending MRI results and reassessment.  Clinical Course as of 10/14/23 2324  Mon Oct 14, 2023  2323 Signed out to Dr. Judd Lien pending  [WS]    Clinical Course User Index [WS] Suezanne Jacquet Jerilee Field, MD     Additional history obtained: -Additional history obtained from {wsadditionalhistorian:28072} -External records from outside source obtained and reviewed including: Chart review including previous notes, labs, imaging, consultation notes including ***   Lab Tests: -I ordered, reviewed, and interpreted labs.   The pertinent results include:   Labs Reviewed  BASIC METABOLIC PANEL - Abnormal; Notable for the following components:      Result Value   CO2 20 (*)    Creatinine, Ser 1.06 (*)    Calcium 8.2 (*)    GFR, Estimated 59 (*)    All other components within normal limits  CBC - Abnormal; Notable for the following components:    RBC 3.64 (*)    Hemoglobin 10.9 (*)    HCT 34.0 (*)    RDW 16.7 (*)    All other components within normal limits  URINALYSIS, ROUTINE W REFLEX MICROSCOPIC    Notable for ***  EKG   EKG Interpretation Date/Time:  Monday October 14 2023 19:24:29 EST Ventricular Rate:  90 PR Interval:  126 QRS Duration:  70 QT Interval:  336 QTC Calculation: 411 R Axis:   -23  Text Interpretation: Normal sinus rhythm Minimal voltage criteria for LVH, may be normal variant ( R in aVL ) Borderline ECG Confirmed by Alvino Blood ([id number]) on 10/14/2023 11:18:26 PM         Imaging Studies ordered: I ordered imaging studies including *** On my interpretation imaging demonstrates *** I independently visualized and interpreted imaging. I agree with the radiologist interpretation   Medicines ordered and prescription drug management: No orders of the defined types were placed in this encounter.   -I have reviewed the patients home medicines and have made adjustments as needed   Consultations Obtained: I requested consultation with the ***,  and discussed lab and imaging findings as well as pertinent plan - they recommend: ***   Cardiac Monitoring: The patient was maintained on a cardiac monitor.  I personally viewed and interpreted the cardiac monitored which showed an underlying rhythm of: ***  Social Determinants of Health:  Diagnosis or treatment significantly limited by social determinants of health: {wssoc:28071}   Reevaluation: After the interventions noted above, I reevaluated the patient and found that their symptoms have {resolved/improved/worsened:23923::"improved"}  Co morbidities that complicate the patient evaluation  Past Medical History:  Diagnosis Date   Anxiety    Asthma    Depression    Eczema    Urticaria       Dispostion: Disposition decision including need for hospitalization was considered, and patient {wsdispo:28070::"discharged from emergency  department."}    Final Clinical Impression(s) / ED Diagnoses Final diagnoses:  None     This chart was dictated using voice recognition software.  Despite best efforts to proofread,  errors can occur which can change the documentation meaning.

## 2023-10-15 ENCOUNTER — Ambulatory Visit (HOSPITAL_COMMUNITY): Payer: Commercial Managed Care - PPO

## 2023-10-15 LAB — URINALYSIS, ROUTINE W REFLEX MICROSCOPIC
Bacteria, UA: NONE SEEN
Bilirubin Urine: NEGATIVE
Glucose, UA: NEGATIVE mg/dL
Ketones, ur: 20 mg/dL — AB
Leukocytes,Ua: NEGATIVE
Nitrite: NEGATIVE
Protein, ur: NEGATIVE mg/dL
Specific Gravity, Urine: 1.02 (ref 1.005–1.030)
pH: 5 (ref 5.0–8.0)

## 2023-10-15 NOTE — Discharge Instructions (Addendum)
Follow-up with your primary doctor in the next week, and return to the ER if you develop any new and/or concerning issues.

## 2023-10-30 ENCOUNTER — Other Ambulatory Visit: Payer: Commercial Managed Care - PPO | Admitting: Urology

## 2023-11-07 ENCOUNTER — Encounter (INDEPENDENT_AMBULATORY_CARE_PROVIDER_SITE_OTHER): Payer: Self-pay | Admitting: *Deleted

## 2023-11-12 ENCOUNTER — Ambulatory Visit (HOSPITAL_COMMUNITY): Payer: Commercial Managed Care - PPO

## 2023-11-26 ENCOUNTER — Ambulatory Visit (HOSPITAL_COMMUNITY)
Admission: RE | Admit: 2023-11-26 | Discharge: 2023-11-26 | Disposition: A | Discharge status: Home / Self Care | Payer: Commercial Managed Care - PPO | Source: Ambulatory Visit | Attending: Urology | Admitting: Urology

## 2023-11-26 DIAGNOSIS — R3129 Other microscopic hematuria: Secondary | ICD-10-CM | POA: Insufficient documentation

## 2023-11-26 DIAGNOSIS — Z720 Tobacco use: Secondary | ICD-10-CM | POA: Insufficient documentation

## 2023-11-26 MED ORDER — IOHEXOL 300 MG/ML  SOLN
125.0000 mL | Freq: Once | INTRAMUSCULAR | Status: AC | PRN
  Administered 2023-11-26: 125 mL via INTRAVENOUS

## 2023-12-06 ENCOUNTER — Telehealth: Payer: Self-pay

## 2023-12-06 NOTE — Telephone Encounter (Signed)
Patient was made aware and voiced understanding"Please notify patient: CT showed no acute findings. Mild bladder wall thickening - indeterminate. Follow up for cystoscopy with Dr. Ronne Binning on 12/30/2023 as planned for further evaluation. Thanks"

## 2023-12-18 ENCOUNTER — Ambulatory Visit: Payer: Commercial Managed Care - PPO | Admitting: Allergy & Immunology

## 2023-12-30 ENCOUNTER — Ambulatory Visit: Payer: Commercial Managed Care - PPO | Admitting: Urology

## 2023-12-30 VITALS — BP 115/71 | HR 85

## 2023-12-30 DIAGNOSIS — R3129 Other microscopic hematuria: Secondary | ICD-10-CM

## 2023-12-30 LAB — MICROSCOPIC EXAMINATION: Bacteria, UA: NONE SEEN

## 2023-12-30 LAB — URINALYSIS, ROUTINE W REFLEX MICROSCOPIC
Bilirubin, UA: NEGATIVE
Glucose, UA: NEGATIVE
Ketones, UA: NEGATIVE
Leukocytes,UA: NEGATIVE
Nitrite, UA: NEGATIVE
Protein,UA: NEGATIVE
Specific Gravity, UA: 1.02 (ref 1.005–1.030)
Urobilinogen, Ur: 0.2 mg/dL (ref 0.2–1.0)
pH, UA: 6 (ref 5.0–7.5)

## 2023-12-30 MED ORDER — CIPROFLOXACIN HCL 500 MG PO TABS
500.0000 mg | ORAL_TABLET | Freq: Once | ORAL | Status: AC
  Administered 2023-12-30: 500 mg via ORAL

## 2023-12-30 NOTE — Progress Notes (Signed)
 Cipro  allergy was reviewed with patient prior to administering.  Patient denied shortness of breath, facial or tongue swelling, rash, and states she can't remember having a reaction to this prescription and/or ingredient other than having "dreams she couldn't understand".  MD notified.

## 2024-01-01 ENCOUNTER — Other Ambulatory Visit: Payer: Self-pay | Admitting: Allergy & Immunology

## 2024-01-07 ENCOUNTER — Telehealth: Payer: Self-pay | Admitting: Allergy & Immunology

## 2024-01-07 ENCOUNTER — Encounter: Payer: Self-pay | Admitting: Urology

## 2024-01-07 NOTE — Telephone Encounter (Signed)
Left voicemail to give the office a call back to schedule [facility] reapproval appointment. Patient is scheduled for 4/9 but need to try to be seen before 02/10/24.

## 2024-01-07 NOTE — Progress Notes (Signed)
   12/30/2023  CC: hematuria   HPI: Christina Esparza is a 62yo here for cystoscopy for hematuria Blood pressure 115/71, pulse 85. NED. A&Ox3.   No respiratory distress   Abd soft, NT, ND Normal external genitalia with patent urethral meatus  Cystoscopy Procedure Note  Patient identification was confirmed, informed consent was obtained, and patient was prepped using Betadine solution.  Lidocaine jelly was administered per urethral meatus.    Procedure: - Flexible cystoscope introduced, without any difficulty.   - Thorough search of the bladder revealed:    normal urethral meatus    normal urothelium    no stones    no ulcers     no tumors    no urethral polyps    no trabeculation  - Ureteral orifices were normal in position and appearance.  Post-Procedure: - Patient tolerated the procedure well  Assessment/ Plan: Followup 6 months with [facility]   No follow-ups on file.  Christina Aye, MD

## 2024-01-07 NOTE — Patient Instructions (Signed)
 Blood in the Pee (Hematuria) in Adults: What to Know  Hematuria is blood in the pee. You may be able to see blood in the pee. In some cases, a health care provider may find blood with a test.  Blood in the pee can be caused by infections of the kidney, bladder, or the urethra. The urethra is the tube that drains pee from the bladder.  Other causes may include: Kidney stones. Infection of the prostate. Cancer. Too much calcium in the pee. Conditions that are passed from parent to child. Too much exercise. Infections can be treated with medicine. A kidney stone will usually leave your body when you pee. If infections or kidney stones didn't cause the blood in the urine, then more tests may be needed. It is very important to tell your provider about any blood in your pee, even if you have no pain or the blood stops with no treatment. Blood in the pee can be a sign of a very serious problem, such as cancer. Follow these instructions at home: Medicines Take your medicines only as told. If you were given antibiotics, take them as told. Do not stop taking them even if you start to feel better. Eating and drinking Drink more fluids as told. Aim to drink 3-4 quarts (2.8-3.8 L) a day. Avoid caffeine, tea, and carbonated drinks. These can bother the bladder. Avoid alcohol if a female because it may irritate the prostate. General instructions If you have been diagnosed with a kidney stone, strain your pee to catch the stone if told by your provider. Empty your bladder often. Avoid holding pee for a long time. If you're female, make sure that: You wipe from front to back after using the bathroom. You use each piece of toilet paper only once. You pee before and after sex. It's up to you to get the results of any tests. Ask when your results will be ready and how to get them. You may need to call or meet with your provider to get your results. Keep all follow-up visits. Your provider will need to know  about any changes or any new symptoms. Contact a health care provider if: Your symptoms don't get better after 3 days. Your symptoms get worse. You have back pain or belly pain. You have a fever or chills. You throw up or feel like you may throw up. You throw up every time you take medicine. Get help right away if: You pass blood clots in your pee. You pass out. These symptoms may be an emergency. Call 911 right away. Do not wait to see if the symptoms will go away. Do not drive yourself to the hospital. This information is not intended to replace advice given to you by your health care provider. Make sure you discuss any questions you have with your health care provider. Document Revised: 08/22/2023 Document Reviewed: 08/01/2023 Elsevier Patient Education  2024 [employer].

## 2024-02-10 ENCOUNTER — Ambulatory Visit (HOSPITAL_BASED_OUTPATIENT_CLINIC_OR_DEPARTMENT_OTHER): Payer: Commercial Managed Care - PPO | Admitting: Adult Health

## 2024-02-10 ENCOUNTER — Encounter (HOSPITAL_BASED_OUTPATIENT_CLINIC_OR_DEPARTMENT_OTHER): Payer: Self-pay | Admitting: Adult Health

## 2024-02-10 VITALS — BP 110/78 | HR 115 | Ht 61.0 in | Wt 105.0 lb

## 2024-02-10 DIAGNOSIS — J4489 Other specified chronic obstructive pulmonary disease: Secondary | ICD-10-CM | POA: Diagnosis not present

## 2024-02-10 MED ORDER — BREZTRI AEROSPHERE 160-9-4.8 MCG/ACT IN AERO: 2.0000 | INHALATION_SPRAY | Freq: Two times a day (BID) | RESPIRATORY_TRACT | 5 refills | Status: DC

## 2024-02-10 MED ORDER — DOXYCYCLINE HYCLATE 100 MG PO TABS: 100.0000 mg | ORAL_TABLET | Freq: Two times a day (BID) | ORAL | 0 refills | Status: DC

## 2024-02-10 NOTE — Progress Notes (Signed)
 @Patient  ID: Christina Esparza, female    DOB: 07-Nov-1961, 63 y.o.   MRN: [id number]  Chief Complaint  Patient presents with   Follow-up   Discussed the use of AI scribe software for clinical note transcription with the patient, who gave verbal consent to proceed.  Referring provider: Elfredia Nevins, MD  HPI: 63 year old female followed for COPD with emphysema and lung nodules (felt to be benign on serial CT follow-up).  Alpha-1 MZ phenotype.  Has chronic allergic rhinitis followed by allergy and asthma  TEST/EVENTS :  Intolerant to Spiriva and Trelegy  High-resolution CT chest April 27, 2021 showed no evidence of interstitial lung disease.  Stable emphysema.  A few scattered pulmonary nodules measuring 4 mm or less.  Unchanged and consistent with benign etiology.  Spirometry 09/02/20 >> FEV1 1.47 (53%), FEV1% 59 PFT 10/28/20 >> FEV1 1.42 (63%), FEV1% 65, TLC 5.33 (115%), RV 3.10 (167%), DLCO 104% A1AT 10/28/20 >> 117, MZ   02/10/2024 Follow up ; COPD with emphysema Patient returns for a follow-up visit.  She was last seen August 2022.  Last visit patient was given a trial of Anoro.  She has previously tried Spiriva and Trelegy but was intolerant. Do not remember if Anoro was effective or not.  She is currently taking Breztri but says she does not take it on a regular basis.  She does have chronic shortness of breath and intermittent cough. She does continue to smoke 1.5 pack cigarettes daily.  We discussed smoking cessation. Patient has chronic allergic rhinitis.  Previously was given [place] and Tezspire but had no perceived benefit.  She is followed by asthma and allergy.   She has lung nodules, with a high-resolution CT from April 27, 2021, showing stable emphysema and a few scattered pulmonary nodules measuring four millimeters or less, unchanged and consistent with a benign etiology. She has a significant smoking history.  We discussed the lung cancer CT screening program.   She  experiences sleep disturbances, including poor sleep quality and episodes of her oxygen levels dropping to 88 during sleep she says she has had a sleep study before but unsure of results.  We have requested a copy of her sleep study.  She reports significant stress over the past three years, particularly following the passing of her parents. She has experienced weight loss, dropping from 120 pounds to 105 pounds, which she attributes to stress.   She did have a tick bite to her left lower leg recently.  She removed a tick about 2 days ago.  She does have some mild redness.  She denies any fever, neck pain, nausea vomiting or diarrhea.  Allergies  Allergen Reactions   Omeprazole Other (See Comments)   Amitiza [Lubiprostone] Itching    Patient states that she experienced a pin sticking sensation, prickling all over.   Linaclotide Nausea Only   Cat Dander    Ceftin [Cefuroxime] Itching   Codeine Nausea And Vomiting   Levaquin [Levofloxacin In D5w]     dream   Penicillin G    Penicillins Other (See Comments)    Felt like she was choking   Trelegy Ellipta [Fluticasone-Umeclidin-Vilant] Other (See Comments)    Burning in the throat     There is no immunization history on file for this patient.  Past Medical History:  Diagnosis Date   Anxiety    Asthma    Depression    Eczema    Urticaria     Tobacco History: Social History  Tobacco Use  Smoking Status Every Day   Current packs/day: 1.00   Average packs/day: 1 pack/day for 20.0 years (20.0 ttl pk-yrs)   Types: Cigarettes  Smokeless Tobacco Never  Tobacco Comments   Currently smokes a pack a day [facility] 06/19/21   Ready to quit: No Counseling given: Yes Tobacco comments: Currently smokes a pack a day [facility] 06/19/21   Outpatient Medications Prior to Visit  Medication Sig Dispense Refill   ALPRAZolam (XANAX) 1 MG tablet Take 1 mg by mouth 3 (three) times daily as needed for anxiety.     azelastine (ASTELIN) 0.1 % nasal spray  PLACE 1 SPRAY INTO BOTH NOSTRILS TWICE DAILY 30 mL 5   EPINEPHrine (AUVI-Q) 0.3 mg/0.3 mL IJ SOAJ injection Inject 0.3 mg into the muscle as needed for anaphylaxis. 2 each 1   FLUoxetine (PROZAC) 20 MG capsule Take 20 mg by mouth daily.     mirtazapine (REMERON) 7.5 MG tablet Take 7.5 mg by mouth at bedtime.     pantoprazole (PROTONIX) 40 MG tablet Take 40 mg by mouth daily.     VENTOLIN HFA 108 (90 Base) MCG/ACT inhaler Inhale 2 puffs into the lungs every 4 (four) hours as needed for wheezing or shortness of breath. 18 g 1   Facility-Administered Medications Prior to Visit  Medication Dose Route Frequency Provider Last Rate Last Admin   tezepelumab-ekko (TEZSPIRE) 210 MG/1. syringe 210 mg  210 mg Subcutaneous Q28 days Alfonse Spruce, MD   210 mg at 09/11/23 1044     Review of Systems:   Constitutional:   No  weight loss, night sweats,  Fevers, chills,+ fatigue, or  lassitude.  HEENT:   No headaches,  Difficulty swallowing,  Tooth/dental problems, or  Sore throat,                No sneezing, itching, ear ache, nasal congestion, post nasal drip,   CV:  No chest pain,  Orthopnea, PND, swelling in lower extremities, anasarca, dizziness, palpitations, syncope.   GI  No heartburn, indigestion, abdominal pain, nausea, vomiting, diarrhea, change in bowel habits, loss of appetite, bloody stools.   Resp: Skin: no rash or lesions.  GU: no dysuria, change in color of urine, no urgency or frequency.  No flank pain, no hematuria   MS:  No joint pain or swelling.  No decreased range of motion.  No back pain.    Physical Exam  BP 110/78   Pulse (!) 115   Ht 5\' 1"  (1.549 m)   Wt 105 lb (47.6 kg)   SpO2 97%   BMI 19.84 kg/m   GEN: A/Ox3; pleasant , NAD, thin   HEENT:  [place]/AT,  NOSE-clear, THROAT-clear, no lesions, no postnasal drip or exudate noted.   NECK:  Supple w/ fair ROM; no JVD; normal carotid impulses w/o bruits; no thyromegaly or nodules palpated; no lymphadenopathy.     RESP  Clear  P & A; w/o, wheezes/ rales/ or rhonchi. no accessory muscle use, no dullness to percussion  CARD:  RRR, no m/r/g, no peripheral edema, pulses intact, no cyanosis or clubbing.  GI:   Soft & nt; nml bowel sounds; no organomegaly or masses detected.   Musco: Warm bil, no deformities or joint swelling noted.   Neuro: alert, no focal deficits noted.    Skin: Warm, small circular lesion along the left lower posterior calf.  No rash noted, mild redness noted    Lab Results:   BNP No results found for: "BNP"  ProBNP No results found for: "PROBNP"  Imaging: No results found.      Latest Ref Rng & Units 10/28/2020    9:37 AM  PFT Results  FVC-Pre L 2.05   FVC-Predicted Pre % 70   FVC-Post L 2.19   FVC-Predicted Post % 74   Pre FEV1/FVC % % 66   Post FEV1/FCV % % 65   FEV1-Pre L 1.36   FEV1-Predicted Pre % 60   FEV1-Post L 1.42   DLCO uncorrected ml/min/mmHg 19.00   DLCO UNC% % 104   DLCO corrected ml/min/mmHg 19.00   DLCO COR %Predicted % 104   DLVA Predicted % 128   TLC L 5.33   TLC % Predicted % 115   RV % Predicted % 167     No results found for: "NITRICOXIDE"      Assessment & Plan:  Assessment and Plan    Chronic Obstructive Pulmonary Disease (COPD) with Emphysema   COPD with emphysema is marked by airway obstruction and loss of lung elasticity, causing significant dyspnea. Her lung function has declined to 49% of expected capacity, worsened by ongoing smoking. She has tried various inhalers, including Spiriva, Trelegy, and Anoro, with some intolerance. Currently, she uses Breztri, which is optimal for her condition. Smoking cessation is crucial to prevent further lung function decline. Markus Daft is recommended for its triple action in reducing airway inflammation and opening airways, Continue Breztri inhaler twice daily. Implement a gradual smoking cessation plan, aiming to reduce to 10 cigarettes per day before considering nicotine patches.  Refer to a lung cancer screening program for an annual low-dose CT scan. Encourage a high-protein diet and increased physical activity to regain muscle mass.  Sleep Apnea   Symptoms suggestive of sleep apnea include fatigue, non-restorative sleep, and oxygen desaturation during sleep. A previous home sleep study results have been requested. . Obtain sleep study results from Dr. Sherwood Gambler. Discuss potential  Advise on sleep with the head of the bed at an incline and avoiding supine sleeping   Chronic Allergic Rhinitis   Chronic allergic rhinitis is managed by an asthma and allergy specialist. She experiences nasal congestion and rhinorrhea, particularly during allergy season. Claritin and saline nasal rinse are recommended to manage symptoms. Start Claritin 5 mg at night and use saline nasal rinse as needed.  Protein calorie malnutrition.  High-protein diet discussed.  May try protein shakes.  Tick bite with tick removal 2 days ago.  Clean area daily with soap and water.  Watch for increased redness.  May use doxycycline x 1 week.  If not improving or worsens will need to follow-up primary care.  Tobacco abuse.  Smoking cessation discussed.  Referral to the lung cancer CT screening program.  Follow-up   Follow-up in 3 months and as needed  I spent  45 minutes dedicated to the care of this patient on the date of this encounter to include pre-visit review of records, face-to-face time with the patient discussing conditions above, post visit ordering of testing, clinical documentation with the electronic health record, making appropriate referrals as documented, and communicating necessary findings to members of the patients care team.    Rubye Oaks, NP 02/10/2024

## 2024-02-10 NOTE — Patient Instructions (Addendum)
 Referred to the lung cancer CT chest screening program.([place])  Sleep study results  Continue on Breztri 2 puffs Twice daily, rinse after use.  Albuterol inhaler as needed Activity as tolerated Work on not smoking Claritin 5mg  At bedtime  As needed   High protein diet.  Doxycycline 100mg  Twice daily  for 1 week, wear sunscreen.  follow up with PCP if leg sore does not resolve or worsens.  Follow-up in 3 months and As needed  -[place] office.

## 2024-02-24 ENCOUNTER — Telehealth: Payer: Self-pay | Admitting: Acute Care

## 2024-02-24 ENCOUNTER — Other Ambulatory Visit: Payer: Self-pay

## 2024-02-24 DIAGNOSIS — Z87891 Personal history of nicotine dependence: Secondary | ICD-10-CM

## 2024-02-24 DIAGNOSIS — Z122 Encounter for screening for malignant neoplasm of respiratory organs: Secondary | ICD-10-CM

## 2024-02-24 DIAGNOSIS — F17210 Nicotine dependence, cigarettes, uncomplicated: Secondary | ICD-10-CM

## 2024-02-24 NOTE — Progress Notes (Deleted)
   [address] [place] [address] Dept: [phone]  FOLLOW UP NOTE  Patient ID: Christina Esparza, female    DOB: May 22, 1961  Age: 63 y.o. MRN: [id number] Date of Office Visit: 02/26/2024  Assessment  Chief Complaint: No chief complaint on file.  HPI Christina Esparza is a 63 year old female who presents to the clinic for follow-up visit.  She was last seen in this clinic on 08/09/2023 by Dr. 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 Her last environmental allergy testing was on 07/13/2020 was positive to dog and cat. Discussed the use of AI scribe software for clinical note transcription with the patient, who gave verbal consent to proceed.  History of Present Illness      Drug Allergies:  Allergies  Allergen Reactions   Omeprazole Other (See Comments)   Amitiza [Lubiprostone] Itching    Patient states that she experienced a pin sticking sensation, prickling all over.   Linaclotide Nausea Only   Cat Dander    Ceftin [Cefuroxime] Itching   Codeine Nausea And Vomiting   Levaquin [Levofloxacin In D5w]     dream   Penicillin G    Penicillins Other (See Comments)    Felt like she was choking   Trelegy Ellipta [Fluticasone-Umeclidin-Vilant] Other (See Comments)    Burning in the throat    Physical Exam: There were no vitals taken for this visit.   Physical Exam  Diagnostics:    Assessment and Plan: No diagnosis found.  No orders of the defined types were placed in this encounter.   There are no Patient Instructions on file for this visit.  No follow-ups on file.    Thank you for the opportunity to care for this patient.  Please do not hesitate to contact me with questions.  Thermon Leyland, FNP [facility]

## 2024-02-24 NOTE — Telephone Encounter (Signed)
 Lung Cancer Screening Narrative/Criteria Questionnaire (Cigarette Smokers Only- No Cigars/Pipes/vapes)   Christina Esparza   SDMV:04/07/24 at 10am/ [facility]                                           1961/08/01                        LDCT: 04/08/24 at 1030am / [facility]    63 y.o.   Phone: [phone]  Lung Screening Narrative (confirm age 36-77 yrs Medicare / 50-80 yrs Private pay insurance)   Insurance information:[employer]   Referring Provider:[place]   This screening involves an initial phone call with a team member from our program. It is called a shared decision making visit. The initial meeting is required by insurance and Medicare to make sure you understand the program. This appointment takes about 15-20 minutes to complete. The CT scan will completed at a separate date/time. This scan takes about 5-10 minutes to complete and you may eat and drink before and after the scan.  Criteria questions for Lung Cancer Screening:   Are you a current or former smoker? Current Age began smoking: 63 yo   If you are a former smoker, what year did you quit smoking? NA   To calculate your smoking history, I need an accurate estimate of how many packs of cigarettes you smoked per day and for how many years. (Not just the number of PPD you are now smoking)   Years smoking 49 x Packs per day 1.5 = Pack years 74   (at least 20 pack yrs)   (Make sure they understand that we need to know how much they have smoked in the past, not just the number of PPD they are smoking now)  Do you have a personal history of cancer?  No    Do you have a family history of cancer? Yes  (cancer type and and relative) sister/breast  Are you coughing up blood?  No  Have you had unexplained weight loss of 15 lbs or more in the last 6 months? No  It looks like you meet all criteria.     Additional information: N/A

## 2024-02-26 ENCOUNTER — Ambulatory Visit: Payer: Commercial Managed Care - PPO | Admitting: Family Medicine

## 2024-02-28 ENCOUNTER — Ambulatory Visit: Payer: Commercial Managed Care - PPO | Admitting: Urology

## 2024-03-02 ENCOUNTER — Telehealth: Payer: Self-pay | Admitting: Urology

## 2024-03-02 ENCOUNTER — Other Ambulatory Visit: Payer: Self-pay

## 2024-03-02 MED ORDER — GEMTESA 75 MG PO TABS: 1.0000 | ORAL_TABLET | Freq: Every day | ORAL | 11 refills | Status: DC

## 2024-03-02 NOTE — Telephone Encounter (Signed)
 Rx sent

## 2024-03-02 NOTE — Telephone Encounter (Signed)
 Patient needs Christina Esparza called into [place] on [address] - Dr Claretta Croft gave her 6 week sample when she was in and told to come back in 6 weeks , now she is almost out and has no refill

## 2024-03-03 NOTE — Telephone Encounter (Signed)
 Medication prior authorization request received.  Completed PA request through cover my meds for drug Gemtesa. KEY: [id number]  Approved: Pending

## 2024-03-04 ENCOUNTER — Telehealth (HOSPITAL_BASED_OUTPATIENT_CLINIC_OR_DEPARTMENT_OTHER): Payer: Self-pay

## 2024-03-04 NOTE — Telephone Encounter (Signed)
 Records have not been received yet. Patient is going to reach out to [facility] and see if she can get them and bring them to us 

## 2024-03-12 ENCOUNTER — Telehealth: Payer: Self-pay

## 2024-03-12 NOTE — Telephone Encounter (Signed)
 Contact [employer] high dollar tier exception to try to patient medication approved or reduction in cost for the patient. Christina Esparza explain that Gemtesa  75mg  is not a formulary medication under the patient approve medication. Medication that are approve formulary under the patient approve medication listed are as follow Tolteradine, Solifenacin, Mirabgron, and Trospium.

## 2024-03-24 ENCOUNTER — Emergency Department (HOSPITAL_COMMUNITY)

## 2024-03-24 ENCOUNTER — Other Ambulatory Visit: Payer: Self-pay

## 2024-03-24 ENCOUNTER — Encounter (HOSPITAL_COMMUNITY): Payer: Self-pay

## 2024-03-24 ENCOUNTER — Inpatient Hospital Stay (HOSPITAL_COMMUNITY)
Admission: EM | Admit: 2024-03-24 | Discharge: 2024-03-26 | DRG: 897 | Disposition: A | Discharge status: Home / Self Care | Attending: Internal Medicine | Admitting: Internal Medicine

## 2024-03-24 DIAGNOSIS — F13239 Sedative, hypnotic or anxiolytic dependence with withdrawal, unspecified: Secondary | ICD-10-CM | POA: Diagnosis not present

## 2024-03-24 DIAGNOSIS — R7989 Other specified abnormal findings of blood chemistry: Secondary | ICD-10-CM | POA: Diagnosis present

## 2024-03-24 DIAGNOSIS — Z8042 Family history of malignant neoplasm of prostate: Secondary | ICD-10-CM

## 2024-03-24 DIAGNOSIS — Z720 Tobacco use: Secondary | ICD-10-CM | POA: Diagnosis not present

## 2024-03-24 DIAGNOSIS — Z880 Allergy status to penicillin: Secondary | ICD-10-CM

## 2024-03-24 DIAGNOSIS — Z8249 Family history of ischemic heart disease and other diseases of the circulatory system: Secondary | ICD-10-CM

## 2024-03-24 DIAGNOSIS — R110 Nausea: Secondary | ICD-10-CM | POA: Diagnosis present

## 2024-03-24 DIAGNOSIS — K219 Gastro-esophageal reflux disease without esophagitis: Secondary | ICD-10-CM | POA: Diagnosis present

## 2024-03-24 DIAGNOSIS — F419 Anxiety disorder, unspecified: Secondary | ICD-10-CM | POA: Diagnosis not present

## 2024-03-24 DIAGNOSIS — L309 Dermatitis, unspecified: Secondary | ICD-10-CM | POA: Diagnosis present

## 2024-03-24 DIAGNOSIS — Z888 Allergy status to other drugs, medicaments and biological substances status: Secondary | ICD-10-CM

## 2024-03-24 DIAGNOSIS — Z79899 Other long term (current) drug therapy: Secondary | ICD-10-CM

## 2024-03-24 DIAGNOSIS — F17210 Nicotine dependence, cigarettes, uncomplicated: Secondary | ICD-10-CM | POA: Diagnosis present

## 2024-03-24 DIAGNOSIS — Z83719 Family history of colon polyps, unspecified: Secondary | ICD-10-CM

## 2024-03-24 DIAGNOSIS — Z881 Allergy status to other antibiotic agents status: Secondary | ICD-10-CM

## 2024-03-24 DIAGNOSIS — R002 Palpitations: Secondary | ICD-10-CM | POA: Diagnosis not present

## 2024-03-24 DIAGNOSIS — Z885 Allergy status to narcotic agent status: Secondary | ICD-10-CM

## 2024-03-24 DIAGNOSIS — F32A Depression, unspecified: Secondary | ICD-10-CM | POA: Diagnosis present

## 2024-03-24 DIAGNOSIS — J4489 Other specified chronic obstructive pulmonary disease: Secondary | ICD-10-CM | POA: Diagnosis present

## 2024-03-24 DIAGNOSIS — R030 Elevated blood-pressure reading, without diagnosis of hypertension: Secondary | ICD-10-CM | POA: Diagnosis present

## 2024-03-24 DIAGNOSIS — R0789 Other chest pain: Secondary | ICD-10-CM | POA: Diagnosis present

## 2024-03-24 DIAGNOSIS — I2489 Other forms of acute ischemic heart disease: Secondary | ICD-10-CM | POA: Diagnosis present

## 2024-03-24 LAB — BASIC METABOLIC PANEL WITH GFR
Anion gap: 11 (ref 5–15)
BUN: 14 mg/dL (ref 8–23)
CO2: 23 mmol/L (ref 22–32)
Calcium: 9.5 mg/dL (ref 8.9–10.3)
Chloride: 99 mmol/L (ref 98–111)
Creatinine, Ser: 0.84 mg/dL (ref 0.44–1.00)
GFR, Estimated: >60 mL/min (ref >60)
Glucose, Bld: 96 mg/dL (ref 70–99)
Potassium: 4.6 mmol/L (ref 3.5–5.1)
Sodium: 133 mmol/L — ABNORMAL LOW (ref 135–145)

## 2024-03-24 LAB — CBC
HCT: 38.3 % (ref 36.0–46.0)
Hemoglobin: 12.6 g/dL (ref 12.0–15.0)
MCH: 31 pg (ref 26.0–34.0)
MCHC: 32.9 g/dL (ref 30.0–36.0)
MCV: 94.3 fL (ref 80.0–100.0)
Platelets: 263 10*3/uL (ref 150–400)
RBC: 4.06 MIL/uL (ref 3.87–5.11)
RDW: 15.6 % — ABNORMAL HIGH (ref 11.5–15.5)
WBC: 6.3 10*3/uL (ref 4.0–10.5)
nRBC: 0 % (ref 0.0–0.2)

## 2024-03-24 LAB — RAPID URINE DRUG SCREEN, HOSP PERFORMED
Amphetamines: NOT DETECTED
Barbiturates: NOT DETECTED
Benzodiazepines: POSITIVE — AB
Cocaine: NOT DETECTED
Opiates: NOT DETECTED
Tetrahydrocannabinol: NOT DETECTED

## 2024-03-24 LAB — T4, FREE: Free T4: 0.76 ng/dL (ref 0.61–1.12)

## 2024-03-24 LAB — TROPONIN I (HIGH SENSITIVITY)
Troponin I (High Sensitivity): 108 ng/L (ref <18)
Troponin I (High Sensitivity): 195 ng/L (ref <18)

## 2024-03-24 LAB — TSH: TSH: 1.635 u[IU]/mL (ref 0.350–4.500)

## 2024-03-24 LAB — MAGNESIUM: Magnesium: 1.9 mg/dL (ref 1.7–2.4)

## 2024-03-24 LAB — D-DIMER, QUANTITATIVE: D-Dimer, Quant: 0.3 ug{FEU}/mL (ref 0.00–0.50)

## 2024-03-24 MED ORDER — LACTATED RINGERS IV BOLUS
1000.0000 mL | Freq: Once | INTRAVENOUS | Status: AC
  Administered 2024-03-24: 1000 mL via INTRAVENOUS

## 2024-03-24 MED ORDER — ONDANSETRON HCL 4 MG/2ML IJ SOLN
4.0000 mg | Freq: Four times a day (QID) | INTRAMUSCULAR | Status: DC | PRN
  Administered 2024-03-25: 4 mg via INTRAVENOUS
  Filled 2024-03-24: qty 2

## 2024-03-24 MED ORDER — ACETAMINOPHEN 325 MG PO TABS: 650.0000 mg | ORAL_TABLET | Freq: Four times a day (QID) | ORAL | Status: DC | PRN

## 2024-03-24 MED ORDER — DIAZEPAM 5 MG/ML IJ SOLN
2.5000 mg | Freq: Once | INTRAMUSCULAR | Status: AC
  Administered 2024-03-24: 2.5 mg via INTRAVENOUS
  Filled 2024-03-24: qty 2

## 2024-03-24 MED ORDER — ENOXAPARIN SODIUM 40 MG/0.4ML IJ SOSY
40.0000 mg | PREFILLED_SYRINGE | INTRAMUSCULAR | Status: DC
Start: 2024-03-24 — End: 2024-03-26
  Administered 2024-03-24 – 2024-03-25 (×2): 40 mg via SUBCUTANEOUS
  Filled 2024-03-24 (×2): qty 0.4

## 2024-03-24 MED ORDER — ALPRAZOLAM 0.5 MG PO TABS
0.5000 mg | ORAL_TABLET | Freq: Three times a day (TID) | ORAL | Status: DC | PRN
  Administered 2024-03-25 – 2024-03-26 (×4): 0.5 mg via ORAL
  Filled 2024-03-24 (×4): qty 1

## 2024-03-24 MED ORDER — ONDANSETRON HCL 4 MG PO TABS
4.0000 mg | ORAL_TABLET | Freq: Four times a day (QID) | ORAL | Status: DC | PRN
  Administered 2024-03-26: 4 mg via ORAL
  Filled 2024-03-24: qty 1

## 2024-03-24 MED ORDER — ACETAMINOPHEN 650 MG RE SUPP: 650.0000 mg | Freq: Four times a day (QID) | RECTAL | Status: DC | PRN

## 2024-03-24 MED ORDER — ONDANSETRON HCL 4 MG/2ML IJ SOLN
4.0000 mg | Freq: Once | INTRAMUSCULAR | Status: AC
  Administered 2024-03-24: 4 mg via INTRAVENOUS
  Filled 2024-03-24: qty 2

## 2024-03-24 NOTE — H&P (Signed)
 History and Physical    Patient: Christina Esparza WUJ:[id number] DOB: Sep 06, 1961 DOA: 03/24/2024 DOS: the patient was seen and examined on 03/25/2024 PCP: Kathyleen Parkins, MD  Patient coming from: Home  Chief Complaint:  Chief Complaint  Patient presents with   Palpitations   HPI: Christina Esparza is a 63 y.o. female with medical history significant of anxiety, GERD, asthma who presents to the emergency department with concern for benzodiazepine withdrawal.  Patient has been on 1 mg of Xanax 2-4 times daily for several years and this was recently changed by PCP to extended release of Xanax with 1 mg in the morning and 0.5 mg in evening.  She complained of chest tightness and pressure that was constant with sensation of jitteriness, palpitations, nausea, anxiety and increased heart rate.  She endorsed use of tobacco, but denies alcohol or any recreational drug use.  Patient denies vomiting, nausea, headache.  She called PCP who asked her to go to the ER for further evaluation.  ED Course:  In the emergency department, she was tachycardic with pulse of 104 bpm, BP 155/90, other vitals were within normal range.  CBC was normal, BMP was normal except for sodium of 133, D-dimer 0.30, magnesium  1.9, troponin 108 > 195 urine drug screen was positive for benzodiazepine, TSH 1.635, free T4 -0.76 Chest x-ray showed mild changes of COPD and mild bronchitis changes Patient was treated with Valium, Zofran  and IV hydration was provided.  TRH was asked to admit patient.  Review of Systems: Review of systems as noted in the HPI. All other systems reviewed and are negative.   Past Medical History:  Diagnosis Date   Anxiety    Asthma    Depression    Eczema    Urticaria    Past Surgical History:  Procedure Laterality Date   CATARACT EXTRACTION W/PHACO Left 04/01/2023   Procedure: CATARACT EXTRACTION PHACO AND INTRAOCULAR LENS PLACEMENT (IOC);  Surgeon: Tarri Farm, MD;  Location: [facility];  Service:  Ophthalmology;  Laterality: Left;  CDE: 9.02   CATARACT EXTRACTION W/PHACO Right 04/29/2023   Procedure: CATARACT EXTRACTION PHACO AND INTRAOCULAR LENS PLACEMENT (IOC);  Surgeon: Tarri Farm, MD;  Location: [facility];  Service: Ophthalmology;  Laterality: Right;  CDE: 6.45   COLONOSCOPY N/A 06/17/2013   Procedure: COLONOSCOPY;  Surgeon: Ruby Corporal, MD;  Location: [facility];  Service: Endoscopy;  Laterality: N/A;  730   ESOPHAGEAL BRUSHING  11/18/2019   Procedure: ESOPHAGEAL BRUSHING;  Surgeon: Ruby Corporal, MD;  Location: [facility];  Service: Endoscopy;;   ESOPHAGEAL DILATION N/A 11/15/2015   Procedure: ESOPHAGEAL DILATION;  Surgeon: Ruby Corporal, MD;  Location: [facility];  Service: Endoscopy;  Laterality: N/A;   ESOPHAGOGASTRODUODENOSCOPY N/A 11/15/2015   Procedure: ESOPHAGOGASTRODUODENOSCOPY (EGD);  Surgeon: Ruby Corporal, MD;  Location: [facility];  Service: Endoscopy;  Laterality: N/A;   ESOPHAGOGASTRODUODENOSCOPY N/A 11/18/2019   Procedure: ESOPHAGOGASTRODUODENOSCOPY (EGD);  Surgeon: Ruby Corporal, MD;  Location: [facility];  Service: Endoscopy;  Laterality: N/A;  2:25   NASAL SEPTOPLASTY W/ TURBINOPLASTY Bilateral 11/18/2018   Procedure: NASAL SEPTOPLASTY WITH BILATERAL TURBINATE REDUCTION;  Surgeon: Reynold Caves, MD;  Location: [facility];  Service: ENT;  Laterality: Bilateral;   TUBAL LIGATION      Social History:  reports that she has been smoking cigarettes. She has a 20 pack-year smoking history. She has never used smokeless tobacco. She reports that she does not drink alcohol and does not use  drugs.   Allergies  Allergen Reactions   Penicillins Anaphylaxis    Felt like she was choking   Amitiza  [Lubiprostone ] Itching    Patient states that she experienced a pin sticking sensation, prickling all over.   Cat Dander Other (See Comments)    Almost like seasonal allergies    Codeine Nausea And Vomiting   Linaclotide Nausea Only    Omeprazole  Other (See Comments)    Only high doses Dizziness, "felt funny"   Trelegy Ellipta  [Fluticasone -Umeclidin-Vilant] Other (See Comments)    Burning in the throat   Ceftin [Cefuroxime] Itching   Dog Epithelium Other (See Comments)    Almost like seasonal allergies   Levaquin [Levofloxacin In D5w] Other (See Comments)    Made her dream and she usually doesn't usually dream    Family History  Problem Relation Age of Onset   Colon polyps Mother    Prostate cancer Father    Heart attack Father    Colon cancer Neg Hx      Prior to Admission medications   Medication Sig Start Date End Date Taking? Authorizing Provider  ALPRAZolam (XANAX XR) 0.5 MG 24 hr tablet Take 0.5 mg by mouth every evening. 03/16/24  Yes [provider]  ALPRAZolam (XANAX XR) 1 MG 24 hr tablet Take 1 mg by mouth every morning. 03/16/24  Yes [provider]  Aspirin-Acetaminophen -Caffeine (GOODY HEADACHE PO) Take 1 Package by mouth 4 (four) times daily as needed (pain).   Yes [provider]  azelastine  (ASTELIN ) 0.1 % nasal spray PLACE 1 SPRAY INTO BOTH NOSTRILS TWICE DAILY 08/09/23  Yes Rochester Chuck, MD  budeson-glycopyrrolate-formoterol  (BREZTRI  AEROSPHERE) 160-9-4.8 MCG/ACT AERO Inhale 2 puffs into the lungs in the morning and at bedtime. 02/10/24  Yes Parrett, [name] S, NP  EPINEPHrine  (AUVI-Q ) 0.3 mg/0.3 mL IJ SOAJ injection Inject 0.3 mg into the muscle as needed for anaphylaxis. 02/21/22  Yes Rochester Chuck, MD  FLUoxetine (PROZAC) 40 MG capsule Take 40 mg by mouth daily. 03/16/24  Yes [provider]  pantoprazole  (PROTONIX ) 20 MG tablet Take 20 mg by mouth daily. 02/15/23  Yes [provider]  VENTOLIN  HFA 108 (90 Base) MCG/ACT inhaler Inhale 2 puffs into the lungs every 4 (four) hours as needed for wheezing or shortness of breath. 03/20/23  Yes Rochester Chuck, MD  Vibegron  (GEMTESA ) 75 MG TABS Take 1 tablet (75 mg total) by mouth daily. 03/02/24   Yes McKenzie, Arden Beck, MD  XIIDRA 5 % SOLN Apply 1 drop to eye every 12 (twelve) hours. 03/18/24  Yes [provider]  ALPRAZolam (XANAX) 1 MG tablet Take 1 mg by mouth 3 (three) times daily as needed for anxiety. 10/29/19   [provider]    Physical Exam: BP 135/76 (BP Location: Left Arm)   Pulse 80   Temp 98.4 F (36.9 C) (Oral)   Resp 20   Ht 5\' 1"  (1.549 m)   Wt 47.6 kg   SpO2 90%   BMI 19.83 kg/m   General: 63 y.o. year-old female well developed well nourished in no acute distress.  Alert and oriented x3. HEENT: NCAT, EOMI Neck: Supple, trachea medial Cardiovascular: Regular rate and rhythm with no rubs or gallops.  No thyromegaly or JVD noted.  No lower extremity edema. 2/4 pulses in all 4 extremities. Respiratory: Clear to auscultation with no wheezes or rales. Good inspiratory effort. Abdomen: Soft, nontender nondistended with normal bowel sounds x4 quadrants. Muskuloskeletal: No cyanosis, clubbing or edema noted  bilaterally Neuro: CN II-XII intact, strength 5/5 x 4, sensation, reflexes intact Skin: No ulcerative lesions noted or rashes Psychiatry: Judgement and insight appear normal. Mood is appropriate for condition and setting          Labs on Admission:  Basic Metabolic Panel: Recent Labs  Lab 03/24/24 1434 03/24/24 1436 03/25/24 0410  NA  --  133* 140  K  --  4.6 3.5  CL  --  99 103  CO2  --  23 26  GLUCOSE  --  96 83  BUN  --  14 10  CREATININE  --  0.84 0.70  CALCIUM  --  9.5 9.2  MG 1.9  --  1.7  PHOS  --   --  3.7   Liver Function Tests: Recent Labs  Lab 03/25/24 0410  AST 18  ALT 12  ALKPHOS 48  BILITOT 0.6  PROT 6.7  ALBUMIN 3.9   No results for input(s): "LIPASE", "AMYLASE" in the last 168 hours. No results for input(s): "AMMONIA" in the last 168 hours. CBC: Recent Labs  Lab 03/24/24 1436 03/25/24 0410  WBC 6.3 4.3  HGB 12.6 11.7*  HCT 38.3 37.1  MCV 94.3 94.6  PLT 263 240   Cardiac Enzymes: No results  for input(s): "CKTOTAL", "CKMB", "CKMBINDEX", "TROPONINI" in the last 168 hours.  BNP (last 3 results) No results for input(s): "BNP" in the last 8760 hours.  ProBNP (last 3 results) No results for input(s): "PROBNP" in the last 8760 hours.  CBG: No results for input(s): "GLUCAP" in the last 168 hours.  Radiological Exams on Admission: DG Chest 2 View Result Date: 03/24/2024 CLINICAL DATA:  Chest pain and shortness of breath.  Smoker. EXAM: CHEST - 2 VIEW COMPARISON:  08/29/2020 FINDINGS: Normal sized heart. Clear lungs with normal vascularity. The lungs are mildly hyperexpanded with mild peribronchial thickening. No pneumothorax or pleural fluid. Unremarkable bones. IMPRESSION: Mild changes of COPD and mild bronchitic changes. Electronically Signed   By: Catherin Closs M.D.   On: 03/24/2024 14:22    EKG: I independently viewed the EKG done and my findings are as followed: Normal sinus rhythm at a rate of 96 bpm.   Assessment/Plan Present on Admission:  Elevated troponin  Anxiety  Tobacco use  Principal Problem:   Elevated troponin Active Problems:   Anxiety   Tobacco use  Elevated Troponin possibly due to type 2 demand Ischemia Troponin 108 > 195, Patient denies chest pain Cardilogist (Dr. Germaine Kohut) was consulted and recommended admitting patient to trend troponin and consult with cardiologist in the morning per EDP Cardilogist will be consulted in the morning  Anxiety Presumed Benzodiazepine withdrawal Continue Xanax 0.5mg  p.o. 3 times daily as needed  Tobacco use Patient was counseled on tobacco use cessation  DVT prophylaxis: Lovenox   Code Status: Full Code   Family Communication: None at bedside   Consults: None  Severity of Illness: The appropriate patient status for this patient is OBSERVATION. Observation status is judged to be reasonable and necessary in order to provide the required intensity of service to ensure the patient's safety. The patient's  presenting symptoms, physical exam findings, and initial radiographic and laboratory data in the context of their medical condition is felt to place them at decreased risk for further clinical deterioration. Furthermore, it is anticipated that the patient will be medically stable for discharge from the hospital within 2 midnights of admission.   Author: Rasheda Ledger, DO 03/25/2024 6:09 AM  For on call review  [email or web address]

## 2024-03-24 NOTE — Progress Notes (Signed)
   03/24/24 2135  TOC Brief Assessment  Insurance and Status Reviewed  Patient has primary care physician Yes  Home environment has been reviewed From home  Prior level of function: Independent  Prior/Current Home Services No current home services  Social Drivers of Health Review SDOH reviewed interventions complete (smoking cessation added to AVS)  Readmission risk has been reviewed Yes  Transition of care needs no transition of care needs at this time   Transition of Care Department [facility]) has reviewed patient and no other TOC needs have been identified at this time. We will continue to monitor patient advancement through interdisciplinary progression rounds. If new patient needs arise, please place a TOC consult.

## 2024-03-24 NOTE — ED Triage Notes (Signed)
 Pt arrived via POV c/o palpitations. Pt reports her PCP has taken her off of her normal medications and started on extended release Xanax, and reports calling her PCP and was advised to seek evaluation in the ER for possible benzo withdrawal. Pt endorses feeling lightheaded and anxious.

## 2024-03-24 NOTE — ED Provider Notes (Signed)
  EMERGENCY DEPARTMENT AT [facility] Provider Note   CSN: [id number] Arrival date & time: 03/24/24  1346     History  Chief Complaint  Patient presents with   Palpitations    Christina Esparza is a 63 y.o. female with PMH as listed below who presents with palpitations.  Her PCP recently tried to change her Xanax prescription.  She had been taking Xanax for years 1 mg 2-4 times per day.  Recently her PCP changed her to Xanax extended release 1 mg in the morning and 0.5 extended release once in the evening.  Patient does not feel like it is enough.  She feels extremely anxious with constant chest pressure and tightness, jitteriness, feeling like her heart is beating fast, palpitations, anxiety, nausea.  She denies any daily alcohol use, other drug use.  No other changes to her medications.  Denies any diaphoresis, vomiting, yawning, light sensitivity, headache, diarrhea.  Told her PCP her symptoms who sent her to the ER for possible benzodiazepine withdrawal.  Smokes tobacco, ~2 ppd since she was 13 (98 pack years).   Past Medical History:  Diagnosis Date   Anxiety    Asthma    Depression    Eczema    Urticaria        Home Medications Prior to Admission medications   Medication Sig Start Date End Date Taking? Authorizing Provider  ALPRAZolam (XANAX XR) 0.5 MG 24 hr tablet Take 0.5 mg by mouth every evening. 03/16/24  Yes [provider]  ALPRAZolam (XANAX XR) 1 MG 24 hr tablet Take 1 mg by mouth every morning. 03/16/24  Yes [provider]  Aspirin-Acetaminophen -Caffeine (GOODY HEADACHE PO) Take 1 Package by mouth 4 (four) times daily as needed (pain).   Yes [provider]  azelastine  (ASTELIN ) 0.1 % nasal spray PLACE 1 SPRAY INTO BOTH NOSTRILS TWICE DAILY 08/09/23  Yes Rochester Chuck, MD  budeson-glycopyrrolate-formoterol  (BREZTRI  AEROSPHERE) 160-9-4.8 MCG/ACT AERO Inhale 2 puffs into the lungs in the morning and at bedtime. 02/10/24   Yes Parrett, [name] S, NP  EPINEPHrine  (AUVI-Q ) 0.3 mg/0.3 mL IJ SOAJ injection Inject 0.3 mg into the muscle as needed for anaphylaxis. 02/21/22  Yes Rochester Chuck, MD  FLUoxetine (PROZAC) 40 MG capsule Take 40 mg by mouth daily. 03/16/24  Yes [provider]  pantoprazole  (PROTONIX ) 20 MG tablet Take 20 mg by mouth daily. 02/15/23  Yes [provider]  VENTOLIN  HFA 108 (90 Base) MCG/ACT inhaler Inhale 2 puffs into the lungs every 4 (four) hours as needed for wheezing or shortness of breath. 03/20/23  Yes Rochester Chuck, MD  Vibegron  (GEMTESA ) 75 MG TABS Take 1 tablet (75 mg total) by mouth daily. 03/02/24  Yes McKenzie, Arden Beck, MD  XIIDRA 5 % SOLN Apply 1 drop to eye every 12 (twelve) hours. 03/18/24  Yes [provider]  ALPRAZolam (XANAX) 1 MG tablet Take 1 mg by mouth 3 (three) times daily as needed for anxiety. 10/29/19   [provider]      Allergies    Penicillins, Amitiza  [lubiprostone ], Cat dander, Codeine, Linaclotide, Omeprazole , Trelegy ellipta  [fluticasone -umeclidin-vilant], Ceftin [cefuroxime], Dog epithelium, and Levaquin [levofloxacin in d5w]    Review of Systems   Review of Systems A 10 point review of systems was performed and is negative unless otherwise reported in HPI.  Physical Exam Updated Vital Signs BP (!) 145/82 (BP Location: Left Arm)   Pulse (!) 101   Temp 98 F (36.7 C) (Oral)  Resp 20   Ht 5\' 1"  (1.549 m)   Wt 47.6 kg   SpO2 95%   BMI 19.83 kg/m  Physical Exam General: Thin-appearing female, lying in bed.  HEENT: PERRLA, Sclera anicteric, MMM, trachea midline.  Cardiology: Mild regular tachycardia, no murmurs/rubs/gallops. BL radial and DP pulses equal bilaterally.  Resp: Normal respiratory rate and effort. CTAB, no wheezes, rhonchi, crackles.  Abd: Soft, non-tender, non-distended. No rebound tenderness or guarding.  GU: Deferred. MSK: No peripheral edema or signs of trauma. Extremities without deformity  or TTP. No cyanosis or clubbing. Skin: warm, dry.  Neuro: A&Ox4, CNs II-XII grossly intact. MAEs. Sensation grossly intact.  Psych: Mildly anxious affect.   ED Results / Procedures / Treatments   Labs (all labs ordered are listed, but only abnormal results are displayed) Labs Reviewed  BASIC METABOLIC PANEL WITH GFR - Abnormal; Notable for the following components:      Result Value   Sodium 133 (*)    All other components within normal limits  CBC - Abnormal; Notable for the following components:   RDW 15.6 (*)    All other components within normal limits  RAPID URINE DRUG SCREEN, HOSP PERFORMED - Abnormal; Notable for the following components:   Benzodiazepines POSITIVE (*)    All other components within normal limits  TROPONIN I (HIGH SENSITIVITY) - Abnormal; Notable for the following components:   Troponin I (High Sensitivity) 108 (*)    All other components within normal limits  TROPONIN I (HIGH SENSITIVITY) - Abnormal; Notable for the following components:   Troponin I (High Sensitivity) 195 (*)    All other components within normal limits  MAGNESIUM   TSH  T4, FREE  D-DIMER, QUANTITATIVE  HIV ANTIBODY (ROUTINE TESTING W REFLEX)  COMPREHENSIVE METABOLIC PANEL WITH GFR  CBC  MAGNESIUM   PHOSPHORUS    EKG EKG Interpretation Date/Time:  Tuesday Mar 24 2024 13:54:54 EDT Ventricular Rate:  96 PR Interval:  134 QRS Duration:  72 QT Interval:  324 QTC Calculation: 409 R Axis:   83  Text Interpretation: Normal sinus rhythm Minimal voltage criteria for LVH, may be normal variant ( Sokolow-Lyon ) Nonspecific ST abnormality  Confirmed by Annita Kindle [phone]) on 03/24/2024 4:28:46 PM  Radiology DG Chest 2 View Result Date: 03/24/2024 CLINICAL DATA:  Chest pain and shortness of breath.  Smoker. EXAM: CHEST - 2 VIEW COMPARISON:  08/29/2020 FINDINGS: Normal sized heart. Clear lungs with normal vascularity. The lungs are mildly hyperexpanded with mild peribronchial thickening.  No pneumothorax or pleural fluid. Unremarkable bones. IMPRESSION: Mild changes of COPD and mild bronchitic changes. Electronically Signed   By: Catherin Closs M.D.   On: 03/24/2024 14:22    Procedures Procedures    Medications Ordered in ED Medications  enoxaparin (LOVENOX) injection 40 mg (40 mg Subcutaneous Given 03/24/24 2233)  acetaminophen  (TYLENOL ) tablet 650 mg (has no administration in time range)    Or  acetaminophen  (TYLENOL ) suppository 650 mg (has no administration in time range)  ondansetron  (ZOFRAN ) tablet 4 mg (has no administration in time range)    Or  ondansetron  (ZOFRAN ) injection 4 mg (has no administration in time range)  ALPRAZolam (XANAX) tablet 0.5 mg (has no administration in time range)  ondansetron  (ZOFRAN ) injection 4 mg (4 mg Intravenous Given 03/24/24 1530)  lactated ringers  bolus 1,000 mL (0 mLs Intravenous Stopped 03/24/24 1619)  diazepam (VALIUM) injection 2.5 mg (2.5 mg Intravenous Given 03/24/24 1530)  diazepam (VALIUM) injection 2.5 mg (2.5 mg Intravenous Given 03/24/24 1937)  ED Course/ Medical Decision Making/ A&P                          Medical Decision Making Amount and/or Complexity of Data Reviewed Labs: ordered. Decision-making details documented in ED Course. Radiology: ordered. Decision-making details documented in ED Course.  Risk Prescription drug management. Decision regarding hospitalization.    This patient presents to the ED for concern of chest tightness, palpitations, this involves an extensive number of treatment options, and is a complaint that carries with it a high risk of complications and morbidity.  I considered the following differential and admission for this acute, potentially life threatening condition.   MDM:    Consider benzodiazepine withdrawal. Lower c/f alcohol withdrawal. No electrolyte derangements, hypo/hyperglycemia. TSH/FT4 wnl. Dimer negative. No anemia. She improves with some IV valium but still feels mildly  anxious with chest tightness. EKG w/o ischemic changes but troponin is mildly elevated. Cardiology made aware and patient will be admitted to medicine.   Clinical Course as of 03/24/24 2307  Tue Mar 24, 2024  1449 DG Chest 2 View Mild changes of COPD and mild bronchitic changes. [HN]  1527 D-Dimer, Quant: 0.30 neg [HN]  1527 CBC(!) unremarkable [HN]  1527 Magnesium : 1.9 wnl [HN]  1623 Troponin I (High Sensitivity)(!!): 108 Elevated troponin ,will d/w cards [HN]  1812 TSH: 1.635 wnl [HN]  1921 Troponin I (High Sensitivity)(!!): 195 Increasing, will notify cardiology [HN]  1922 Patient is very mildly bu tnot significantly tachycardic, only 90-110 bpm. Overall questionable whether such mild tachycardic would cause this troponin bump.  [HN]    Clinical Course User Index [HN] Merdis Stalling, MD    Labs: I Ordered, and personally interpreted labs.  The pertinent results include:  those listed above  Imaging Studies ordered: I ordered imaging studies including CXR I independently visualized and interpreted imaging. I agree with the radiologist interpretation  Additional history obtained from those listed above.    Cardiac Monitoring: The patient was maintained on a cardiac monitor.  I personally viewed and interpreted the cardiac monitored which showed an underlying rhythm of: sinus tachycarida, then NSR  Reevaluation: After the interventions noted above, I reevaluated the patient and found that they have :improved  Social Determinants of Health: Lives independently  Disposition:  Admitted to medicine w/ cards following  Co morbidities that complicate the patient evaluation  Past Medical History:  Diagnosis Date   Anxiety    Asthma    Depression    Eczema    Urticaria      Medicines Meds ordered this encounter  Medications   ondansetron  (ZOFRAN ) injection 4 mg   lactated ringers  bolus 1,000 mL   diazepam (VALIUM) injection 2.5 mg   diazepam (VALIUM) injection  2.5 mg   enoxaparin (LOVENOX) injection 40 mg   OR Linked Order Group    acetaminophen  (TYLENOL ) tablet 650 mg    acetaminophen  (TYLENOL ) suppository 650 mg   OR Linked Order Group    ondansetron  (ZOFRAN ) tablet 4 mg    ondansetron  (ZOFRAN ) injection 4 mg   ALPRAZolam (XANAX) tablet 0.5 mg    I have reviewed the patients home medicines and have made adjustments as needed  Problem List / ED Course: Problem List Items Addressed This Visit       Other   * (Principal) Elevated troponin   Other Visit Diagnoses       Heart palpitations    -  Primary  This note was created using dictation software, which may contain spelling or grammatical errors.    Merdis Stalling, MD 03/24/24 [phone]

## 2024-03-24 NOTE — Plan of Care (Signed)

## 2024-03-24 NOTE — ED Notes (Signed)
Report  Given to floor.

## 2024-03-25 DIAGNOSIS — F13930 Sedative, hypnotic or anxiolytic use, unspecified with withdrawal, uncomplicated: Secondary | ICD-10-CM

## 2024-03-25 DIAGNOSIS — R002 Palpitations: Secondary | ICD-10-CM

## 2024-03-25 DIAGNOSIS — K219 Gastro-esophageal reflux disease without esophagitis: Secondary | ICD-10-CM | POA: Diagnosis present

## 2024-03-25 DIAGNOSIS — Z83719 Family history of colon polyps, unspecified: Secondary | ICD-10-CM | POA: Diagnosis not present

## 2024-03-25 DIAGNOSIS — Z880 Allergy status to penicillin: Secondary | ICD-10-CM | POA: Diagnosis not present

## 2024-03-25 DIAGNOSIS — F419 Anxiety disorder, unspecified: Secondary | ICD-10-CM | POA: Diagnosis present

## 2024-03-25 DIAGNOSIS — R030 Elevated blood-pressure reading, without diagnosis of hypertension: Secondary | ICD-10-CM | POA: Diagnosis present

## 2024-03-25 DIAGNOSIS — I5A Non-ischemic myocardial injury (non-traumatic): Secondary | ICD-10-CM | POA: Diagnosis not present

## 2024-03-25 DIAGNOSIS — Z881 Allergy status to other antibiotic agents status: Secondary | ICD-10-CM | POA: Diagnosis not present

## 2024-03-25 DIAGNOSIS — F17210 Nicotine dependence, cigarettes, uncomplicated: Secondary | ICD-10-CM | POA: Diagnosis present

## 2024-03-25 DIAGNOSIS — J4489 Other specified chronic obstructive pulmonary disease: Secondary | ICD-10-CM | POA: Diagnosis present

## 2024-03-25 DIAGNOSIS — R110 Nausea: Secondary | ICD-10-CM | POA: Diagnosis present

## 2024-03-25 DIAGNOSIS — L309 Dermatitis, unspecified: Secondary | ICD-10-CM | POA: Diagnosis present

## 2024-03-25 DIAGNOSIS — Z888 Allergy status to other drugs, medicaments and biological substances status: Secondary | ICD-10-CM | POA: Diagnosis not present

## 2024-03-25 DIAGNOSIS — R079 Chest pain, unspecified: Secondary | ICD-10-CM | POA: Diagnosis not present

## 2024-03-25 DIAGNOSIS — F32A Depression, unspecified: Secondary | ICD-10-CM | POA: Diagnosis present

## 2024-03-25 DIAGNOSIS — Z8042 Family history of malignant neoplasm of prostate: Secondary | ICD-10-CM | POA: Diagnosis not present

## 2024-03-25 DIAGNOSIS — R0789 Other chest pain: Secondary | ICD-10-CM | POA: Diagnosis present

## 2024-03-25 DIAGNOSIS — R7989 Other specified abnormal findings of blood chemistry: Secondary | ICD-10-CM | POA: Diagnosis not present

## 2024-03-25 DIAGNOSIS — F13239 Sedative, hypnotic or anxiolytic dependence with withdrawal, unspecified: Secondary | ICD-10-CM | POA: Diagnosis present

## 2024-03-25 DIAGNOSIS — Z720 Tobacco use: Secondary | ICD-10-CM | POA: Diagnosis not present

## 2024-03-25 DIAGNOSIS — I2489 Other forms of acute ischemic heart disease: Secondary | ICD-10-CM | POA: Diagnosis present

## 2024-03-25 DIAGNOSIS — Z885 Allergy status to narcotic agent status: Secondary | ICD-10-CM | POA: Diagnosis not present

## 2024-03-25 DIAGNOSIS — Z79899 Other long term (current) drug therapy: Secondary | ICD-10-CM | POA: Diagnosis not present

## 2024-03-25 DIAGNOSIS — Z8249 Family history of ischemic heart disease and other diseases of the circulatory system: Secondary | ICD-10-CM | POA: Diagnosis not present

## 2024-03-25 LAB — CBC
HCT: 37.1 % (ref 36.0–46.0)
Hemoglobin: 11.7 g/dL — ABNORMAL LOW (ref 12.0–15.0)
MCH: 29.8 pg (ref 26.0–34.0)
MCHC: 31.5 g/dL (ref 30.0–36.0)
MCV: 94.6 fL (ref 80.0–100.0)
Platelets: 240 10*3/uL (ref 150–400)
RBC: 3.92 MIL/uL (ref 3.87–5.11)
RDW: 15.5 % (ref 11.5–15.5)
WBC: 4.3 10*3/uL (ref 4.0–10.5)
nRBC: 0 % (ref 0.0–0.2)

## 2024-03-25 LAB — COMPREHENSIVE METABOLIC PANEL WITH GFR
ALT: 12 U/L (ref 0–44)
AST: 18 U/L (ref 15–41)
Albumin: 3.9 g/dL (ref 3.5–5.0)
Alkaline Phosphatase: 48 U/L (ref 38–126)
Anion gap: 11 (ref 5–15)
BUN: 10 mg/dL (ref 8–23)
CO2: 26 mmol/L (ref 22–32)
Calcium: 9.2 mg/dL (ref 8.9–10.3)
Chloride: 103 mmol/L (ref 98–111)
Creatinine, Ser: 0.7 mg/dL (ref 0.44–1.00)
GFR, Estimated: >60 mL/min (ref >60)
Glucose, Bld: 83 mg/dL (ref 70–99)
Potassium: 3.5 mmol/L (ref 3.5–5.1)
Sodium: 140 mmol/L (ref 135–145)
Total Bilirubin: 0.6 mg/dL (ref 0.0–1.2)
Total Protein: 6.7 g/dL (ref 6.5–8.1)

## 2024-03-25 LAB — MAGNESIUM: Magnesium: 1.7 mg/dL (ref 1.7–2.4)

## 2024-03-25 LAB — TROPONIN I (HIGH SENSITIVITY): Troponin I (High Sensitivity): 116 ng/L (ref <18)

## 2024-03-25 LAB — HIV ANTIBODY (ROUTINE TESTING W REFLEX): HIV Screen 4th Generation wRfx: NONREACTIVE

## 2024-03-25 LAB — PHOSPHORUS: Phosphorus: 3.7 mg/dL (ref 2.5–4.6)

## 2024-03-25 MED ORDER — PANTOPRAZOLE SODIUM 40 MG PO TBEC
40.0000 mg | DELAYED_RELEASE_TABLET | Freq: Every day | ORAL | Status: DC
  Administered 2024-03-25: 40 mg via ORAL
  Filled 2024-03-25: qty 1

## 2024-03-25 MED ORDER — BUDESON-GLYCOPYRROL-FORMOTEROL 160-9-4.8 MCG/ACT IN AERO
2.0000 | INHALATION_SPRAY | Freq: Two times a day (BID) | RESPIRATORY_TRACT | Status: DC
  Administered 2024-03-25 – 2024-03-26 (×3): 2 via RESPIRATORY_TRACT
  Filled 2024-03-25 (×2): qty 5.9

## 2024-03-25 MED ORDER — PANTOPRAZOLE SODIUM 40 MG PO TBEC
40.0000 mg | DELAYED_RELEASE_TABLET | Freq: Two times a day (BID) | ORAL | Status: DC
  Administered 2024-03-25 – 2024-03-26 (×2): 40 mg via ORAL
  Filled 2024-03-25 (×2): qty 1

## 2024-03-25 MED ORDER — MAGNESIUM SULFATE 2 GM/50ML IV SOLN
2.0000 g | Freq: Once | INTRAVENOUS | Status: AC
  Administered 2024-03-25: 2 g via INTRAVENOUS
  Filled 2024-03-25: qty 50

## 2024-03-25 MED ORDER — FLUOXETINE HCL 10 MG PO CAPS
40.0000 mg | ORAL_CAPSULE | Freq: Every day | ORAL | Status: DC
  Administered 2024-03-25 – 2024-03-26 (×2): 40 mg via ORAL
  Filled 2024-03-25 (×2): qty 4

## 2024-03-25 MED ORDER — POTASSIUM CHLORIDE CRYS ER 20 MEQ PO TBCR
40.0000 meq | EXTENDED_RELEASE_TABLET | Freq: Two times a day (BID) | ORAL | Status: AC
  Administered 2024-03-25 (×2): 40 meq via ORAL
  Filled 2024-03-25 (×2): qty 2

## 2024-03-25 NOTE — Progress Notes (Signed)
 Progress Note   Patient: Christina Esparza ZOX:[id number] DOB: 1961/10/02 DOA: 03/24/2024     0 DOS: the patient was seen and examined on 03/25/2024   Brief hospital admission narrative: As per H&P written by Dr. Elyse Hand on 03/24/2024 Christina Esparza is a 63 y.o. female with medical history significant of anxiety, GERD, asthma who presents to the emergency department with concern for benzodiazepine withdrawal.  Patient has been on 1 mg of Xanax 2-4 times daily for several years and this was recently changed by PCP to extended release of Xanax with 1 mg in the morning and 0.5 mg in evening.  She complained of chest tightness and pressure that was constant with sensation of jitteriness, palpitations, nausea, anxiety and increased heart rate.  She endorsed use of tobacco, but denies alcohol or any recreational drug use.  Patient denies vomiting, nausea, headache.  She called PCP who asked her to go to the ER for further evaluation.   ED Course:  In the emergency department, she was tachycardic with pulse of 104 bpm, BP 155/90, other vitals were within normal range.  CBC was normal, BMP was normal except for sodium of 133, D-dimer 0.30, magnesium  1.9, troponin 108 > 195 urine drug screen was positive for benzodiazepine, TSH 1.635, free T4 -0.76 Chest x-ray showed mild changes of COPD and mild bronchitis changes Patient was treated with Valium, Zofran  and IV hydration was provided.  TRH was asked to admit patient.  Assessment and plan 1-atypical chest pain with elevated troponin - Risk factor III-IV - Case discussed with cardiology service who recommended 2D echo; if echo without wall motion abnormalities and preserved ejection fraction planning to pursued a stress test as an outpatient - Presentation most likely driven by presumed benzodiazepine withdrawal and demand ischemia associated with palpitations -After receiving immediate release Xanax at adequate dose patient's symptoms has drastically improved  and not ischemic process appreciated on telemetry. - Continue monitoring  2-GERD - Continue adjusted dose of PPI.  3-tobacco abuse - Cessation counseling provided - Patient declined nicotine patch at the moment.  4-anxiety - With presumed benzodiazepine withdrawal after experiencing adjustment in her chronic Xanax dosage and transition to extended release form from immediate release form. - Continue current benzodiazepine therapy - Outpatient follow-up with psychiatry service recommended and discussed - Patient will require to be weaned off slowly if changes to her benzodiazepine therapy is planned.  Subjective:  No fever, no shortness of breath, no diaphoresis and currently reporting no significant chest discomfort.  Good saturation on room air appreciated.  Physical Exam: Vitals:   03/25/24 0431 03/25/24 0743 03/25/24 1027 03/25/24 1339  BP: 135/76 (!) 147/84  130/75  Pulse: 80 81  97  Resp:      Temp: 98.4 F (36.9 C) 99 F (37.2 C)  (!) 97.5 F (36.4 C)  TempSrc: Oral Oral  Oral  SpO2: 90% 90% 91% 94%  Weight:      Height:       General exam: Alert, awake, oriented x 3 and in no acute distress. Respiratory system: Clear to auscultation. Respiratory effort normal.  Good saturation on room air. Cardiovascular system:RRR. No rubs or gallops appreciated on exam. Gastrointestinal system: Abdomen is nondistended, soft and nontender.  Positive bowel sounds. Central nervous system: Moving 4 limbs spontaneously.  No focal neurological deficits. Extremities: No cyanosis, clubbing or edema. Skin: No petechiae. Psychiatry: Judgement and insight appear normal.  Slightly anxious on exam.   Data Reviewed: 2D echo: Pending Comprehensive metabolic panel:  Sodium 140, potassium 3.5, chloride 103, bicarb 26, BUN 10, creatinine 0.70, normal LFTs and GFR >60 CBC: White blood cell 4.3, hemoglobin 11.7 and platelet count 240K Magnesium : 1.7 High sensitive troponin: 108 >> 195 >>  116   Family Communication: Sister and best friend at bedside.  Disposition: Status is: Inpatient Remains inpatient appropriate because: Continue further evaluation from cardiology standpoint.   Time spent: 40 minutes  Author: Justina Oman, MD 03/25/2024 5:16 PM  For on call review [email or web address].

## 2024-03-25 NOTE — Consult Note (Signed)
 Cardiology Consultation   Patient ID: Christina Esparza MRN: [id number]; DOB: 07/10/1961  Admit date: 03/24/2024 Date of Consult: 03/25/2024  PCP:  Kathyleen Parkins, MD   [facility] Providers Cardiologist:  Lasalle Pointer, MD - NEW    Patient Profile:   Christina Esparza is a 63 y.o. female with a hx of Anxiety, GERD, Asthma who is being seen 03/25/2024 for the evaluation of elevated troponin  at the request of Dr. Michaelene Admire.  History of Present Illness:   Christina Esparza has never been seen by heartcare or any other cardiology team per care everywhere.   Presented to the ED on 5/6 for concern of benzodiazepine withdrawal associated with chest tightness/pressure, palpations, jitteriness, nausea, anxiety and tachycardia. Previously, she took Xanax 1 mg 2-4 times daily for several years. Recently, this was changed by PCP to extended release of Xanax with 1 mg in the am and 0.5 in the pm. ED exam revealed HR 104, BP 155/90 and normal range for other vitals. CBC WNL, BMP WNL except Na 133, Tn 108 > 195, D-dimer WNL, TSH/T4  WNL, UDS positive for benzos. CXR showed mild changes of COPD and mild bronchitic changes. EKG showed NSR, HR 96, borderline LVH, inverted T wave AVL. ED treated with IV Valium, IV Lovenox, and IV LR bolus.   On interview, patient is accompanied bedside by her husband.  She reported constant chest pressure that has been present over 1 year, 10/10, located in lower sternal region, worse with deep breath, non positional, non exertional. She was switched to ER Xanax about 5 days ago. Ever since, she noted episodes of "heart racing" that lasted for few minutes that could occur at rest or with exertion. Reported SOB sometimes when walking to the restrooms that was relieved by rest. Also noted, dizziness with exertion sometimes. Denies any orthopnea, PND, edema, or syncope. She think this is related to recent medication change as well as ongoing stress and anxiety  x years. She  noted her activity level is limited by emotional distress and desire not to do anything. Reported tobacco use, approx 2 PPD for 49 years. Denies any ETOH or drug use. Family history significant for MI (father) and HLD (mother, father).    Past Medical History:  Diagnosis Date   Anxiety    Asthma    Depression    Eczema    Urticaria     Past Surgical History:  Procedure Laterality Date   CATARACT EXTRACTION W/PHACO Left 04/01/2023   Procedure: CATARACT EXTRACTION PHACO AND INTRAOCULAR LENS PLACEMENT (IOC);  Surgeon: Tarri Farm, MD;  Location: [facility];  Service: Ophthalmology;  Laterality: Left;  CDE: 9.02   CATARACT EXTRACTION W/PHACO Right 04/29/2023   Procedure: CATARACT EXTRACTION PHACO AND INTRAOCULAR LENS PLACEMENT (IOC);  Surgeon: Tarri Farm, MD;  Location: [facility];  Service: Ophthalmology;  Laterality: Right;  CDE: 6.45   COLONOSCOPY N/A 06/17/2013   Procedure: COLONOSCOPY;  Surgeon: Ruby Corporal, MD;  Location: [facility];  Service: Endoscopy;  Laterality: N/A;  730   ESOPHAGEAL BRUSHING  11/18/2019   Procedure: ESOPHAGEAL BRUSHING;  Surgeon: Ruby Corporal, MD;  Location: [facility];  Service: Endoscopy;;   ESOPHAGEAL DILATION N/A 11/15/2015   Procedure: ESOPHAGEAL DILATION;  Surgeon: Ruby Corporal, MD;  Location: [facility];  Service: Endoscopy;  Laterality: N/A;   ESOPHAGOGASTRODUODENOSCOPY N/A 11/15/2015   Procedure: ESOPHAGOGASTRODUODENOSCOPY (EGD);  Surgeon: Ruby Corporal, MD;  Location: [facility];  Service: Endoscopy;  Laterality: N/A;   ESOPHAGOGASTRODUODENOSCOPY N/A 11/18/2019   Procedure: ESOPHAGOGASTRODUODENOSCOPY (EGD);  Surgeon: Ruby Corporal, MD;  Location: [facility];  Service: Endoscopy;  Laterality: N/A;  2:25   NASAL SEPTOPLASTY W/ TURBINOPLASTY Bilateral 11/18/2018   Procedure: NASAL SEPTOPLASTY WITH BILATERAL TURBINATE REDUCTION;  Surgeon: Reynold Caves, MD;  Location: [facility];  Service: ENT;  Laterality: Bilateral;    TUBAL LIGATION       Home Medications:  Prior to Admission medications   Medication Sig Start Date End Date Taking? Authorizing Provider  ALPRAZolam (XANAX XR) 0.5 MG 24 hr tablet Take 0.5 mg by mouth every evening. 03/16/24  Yes [provider]  ALPRAZolam (XANAX XR) 1 MG 24 hr tablet Take 1 mg by mouth every morning. 03/16/24  Yes [provider]  Aspirin-Acetaminophen -Caffeine (GOODY HEADACHE PO) Take 1 Package by mouth 4 (four) times daily as needed (pain).   Yes [provider]  azelastine  (ASTELIN ) 0.1 % nasal spray PLACE 1 SPRAY INTO BOTH NOSTRILS TWICE DAILY 08/09/23  Yes Rochester Chuck, MD  budeson-glycopyrrolate-formoterol  (BREZTRI  AEROSPHERE) 160-9-4.8 MCG/ACT AERO Inhale 2 puffs into the lungs in the morning and at bedtime. 02/10/24  Yes Parrett, [name] S, NP  EPINEPHrine  (AUVI-Q ) 0.3 mg/0.3 mL IJ SOAJ injection Inject 0.3 mg into the muscle as needed for anaphylaxis. 02/21/22  Yes Rochester Chuck, MD  FLUoxetine (PROZAC) 40 MG capsule Take 40 mg by mouth daily. 03/16/24  Yes [provider]  pantoprazole  (PROTONIX ) 20 MG tablet Take 20 mg by mouth daily. 02/15/23  Yes [provider]  VENTOLIN  HFA 108 (90 Base) MCG/ACT inhaler Inhale 2 puffs into the lungs every 4 (four) hours as needed for wheezing or shortness of breath. 03/20/23  Yes Rochester Chuck, MD  Vibegron  (GEMTESA ) 75 MG TABS Take 1 tablet (75 mg total) by mouth daily. 03/02/24  Yes McKenzie, Arden Beck, MD  XIIDRA 5 % SOLN Apply 1 drop to eye every 12 (twelve) hours. 03/18/24  Yes [provider]  ALPRAZolam (XANAX) 1 MG tablet Take 1 mg by mouth 3 (three) times daily as needed for anxiety. 10/29/19   [provider]    Inpatient Medications: Scheduled Meds:  enoxaparin (LOVENOX) injection  40 mg Subcutaneous Q24H   Continuous Infusions:  PRN Meds: acetaminophen  **OR** acetaminophen , ALPRAZolam, ondansetron  **OR** ondansetron  (ZOFRAN )  IV  Allergies:    Allergies  Allergen Reactions   Penicillins Anaphylaxis    Felt like she was choking   Amitiza  [Lubiprostone ] Itching    Patient states that she experienced a pin sticking sensation, prickling all over.   Cat Dander Other (See Comments)    Almost like seasonal allergies    Codeine Nausea And Vomiting   Linaclotide Nausea Only   Omeprazole  Other (See Comments)    Only high doses Dizziness, "felt funny"   Trelegy Ellipta  [Fluticasone -Umeclidin-Vilant] Other (See Comments)    Burning in the throat   Ceftin [Cefuroxime] Itching   Dog Epithelium Other (See Comments)    Almost like seasonal allergies   Levaquin [Levofloxacin In D5w] Other (See Comments)    Made her dream and she usually doesn't usually dream    Social History:   Social History   Socioeconomic History   Marital status: Married    Spouse name: Not on file   Number of children: Not on file   Years of education: Not on file   Highest education level: Not on file  Occupational History   Not on file  Tobacco Use   Smoking status: Every Day    Current packs/day: 1.00    Average packs/day: 1 pack/day for 20.0 years (20.0 ttl pk-yrs)    Types: Cigarettes   Smokeless tobacco: Never   Tobacco comments:    Currently smokes a pack a day [facility] 06/19/21  Vaping Use   Vaping status: Never Used  Substance and Sexual Activity   Alcohol use: No    Alcohol/week: 0.0 standard drinks of alcohol   Drug use: No   Sexual activity: Not Currently    Birth control/protection: Surgical  Other Topics Concern   Not on file  Social History Narrative   Not on file   Social Drivers of Health   Financial Resource Strain: Not on file  Food Insecurity: Not on file  Transportation Needs: Not on file  Physical Activity: Not on file  Stress: Not on file  Social Connections: Not on file  Intimate Partner Violence: Not At Risk (09/17/2023)   Received from [facility]   Humiliation, Afraid, Rape, and Kick  questionnaire    Fear of Current or Ex-Partner: No    Emotionally Abused: No    Physically Abused: No    Sexually Abused: No    Family History:   Family History  Problem Relation Age of Onset   Colon polyps Mother    Prostate cancer Father    Heart attack Father    Colon cancer Neg Hx      ROS:  Please see the history of present illness.  All other ROS reviewed and negative.     Physical Exam/Data:   Vitals:   03/24/24 2352 03/25/24 0247 03/25/24 0431 03/25/24 0743  BP: 122/73 (!) 153/78 135/76 (!) 147/84  Pulse: 79 98 80 81  Resp: 20     Temp: 98.5 F (36.9 C)  98.4 F (36.9 C) 99 F (37.2 C)  TempSrc: Oral  Oral Oral  SpO2: 93% 92% 90% 90%  Weight:      Height:        Intake/Output Summary (Last 24 hours) at 03/25/2024 0906 Last data filed at 03/24/2024 1619 Gross per 24 hour  Intake 1000 ml  Output --  Net 1000 ml      03/24/2024    1:51 PM 02/10/2024   10:44 AM 10/14/2023    7:20 PM  Last 3 Weights  Weight (lbs) 104 lb 15 oz 105 lb 113 lb  Weight (kg) 47.6 kg 47.628 kg 51.256 kg     Body mass index is 19.83 kg/m.  General:  Well nourished, well developed, in no acute distress HEENT: normal Neck: no JVD Vascular: No carotid bruits; Distal pulses 2+ bilaterally Cardiac:  normal S1, S2; RRR; no murmur  Lungs:  clear to auscultation bilaterally, no wheezing, rhonchi or rales  Abd: soft, nontender, no hepatomegaly  Ext: no edema Musculoskeletal:  No deformities, BUE and BLE strength normal and equal Skin: warm and dry  Neuro:  CNs 2-12 intact, no focal abnormalities noted Psych:  Normal affect   EKG:  The EKG was personally reviewed and demonstrates:  NSR, HR 96, borderline LVH, inverted T wave AVL Telemetry:  Telemetry was personally reviewed and demonstrates:  NSR, HR main 70-80's with brief episodes of tachycardia, highest 140's.   Relevant CV Studies: ECHO pending   Laboratory Data:  High Sensitivity Troponin:   Recent Labs  Lab 03/24/24 1436  03/24/24 1747 03/25/24 0410  TROPONINIHS 108* 195* 116*     Chemistry Recent Labs  Lab 03/24/24 1434 03/24/24 1436 03/25/24 0410  NA  --  133* 140  K  --  4.6 3.5  CL  --  99 103  CO2  --  23 26  GLUCOSE  --  96 83  BUN  --  14 10  CREATININE  --  0.84 0.70  CALCIUM  --  9.5 9.2  MG 1.9  --  1.7  GFRNONAA  --  >60 >60  ANIONGAP  --  11 11    Recent Labs  Lab 03/25/24 0410  PROT 6.7  ALBUMIN 3.9  AST 18  ALT 12  ALKPHOS 48  BILITOT 0.6   Lipids No results for input(s): "CHOL", "TRIG", "HDL", "LABVLDL", "LDLCALC", "CHOLHDL" in the last 168 hours.  Hematology Recent Labs  Lab 03/24/24 1436 03/25/24 0410  WBC 6.3 4.3  RBC 4.06 3.92  HGB 12.6 11.7*  HCT 38.3 37.1  MCV 94.3 94.6  MCH 31.0 29.8  MCHC 32.9 31.5  RDW 15.6* 15.5  PLT 263 240   Thyroid  Recent Labs  Lab 03/24/24 1434  TSH 1.635  FREET4 0.76    BNPNo results for input(s): "BNP", "PROBNP" in the last 168 hours.  DDimer  Recent Labs  Lab 03/24/24 1434  DDIMER 0.30     Radiology/Studies:  DG Chest 2 View Result Date: 03/24/2024 CLINICAL DATA:  Chest pain and shortness of breath.  Smoker. EXAM: CHEST - 2 VIEW COMPARISON:  08/29/2020 FINDINGS: Normal sized heart. Clear lungs with normal vascularity. The lungs are mildly hyperexpanded with mild peribronchial thickening. No pneumothorax or pleural fluid. Unremarkable bones. IMPRESSION: Mild changes of COPD and mild bronchitic changes. Electronically Signed   By: Catherin Closs M.D.   On: 03/24/2024 14:22     Assessment and Plan:   Elevated Troponin  Atypical CP  Reported constant Chest pressure x 1 year, 10/10, worse with deep breath, non positional, non exertional.  Tn 108 > 195 > 116  Less concern for ischemic cause since TN flat, EKG without ischemic changes, and atypical CP. However with family hx, tobacco use and other symptoms including DOE and dizziness with exertion, I would consider for outpatient stress test. Dicussed with patient and  she would prefer to avoid exercise stress test but is open to Lexiscan. Reviewed Coronary CTA but patient would prefer not to drive to [place] at this time. Would not consider for cath at this time, however, ECHO pending if ECHO abnormal, would readdress.  Could be 2/2 to tachycardia episodes or demand ischemia due to possible withdrawal. No need for heparin at this time.   Sinus Tachycardia  Reported intermittent episodes of "heart racing" x 5 days since being switched to XR Xanax.  CBC, TSH WNL. K 3.5, Mg 1.7. both borderline normal. K and mg supplements ordered. Telemetry showed NSR, HR 70-80's with brief episodes of sinus tachycardia.  Could be 2/2 to anxiety or benzo withdrawal.  Can consider for outpatient zio monitor.  Continue to monitor Telemetry   Elevated BP  BP noted to elevated this admission. Most recent BP 147/84.  Reported no hx of BP issues.  Could be 2/2 to stress and anxiety. However, EKG suggest borderline LVH. ECHO pending. Would continue to monitor trend before adding antiHTN. If needed, would consider BB.     For questions or updates, please contact [facility] Please consult [email or web address] for contact info under    Signed, Metta Actis, PA-C  03/25/2024 9:06 AM

## 2024-03-26 ENCOUNTER — Other Ambulatory Visit: Payer: Self-pay

## 2024-03-26 ENCOUNTER — Ambulatory Visit (HOSPITAL_COMMUNITY): Admission: EM | Admit: 2024-03-26 | Discharge: 2024-03-26 | Disposition: A | Discharge status: Home / Self Care

## 2024-03-26 ENCOUNTER — Inpatient Hospital Stay (HOSPITAL_COMMUNITY)

## 2024-03-26 DIAGNOSIS — K219 Gastro-esophageal reflux disease without esophagitis: Secondary | ICD-10-CM

## 2024-03-26 DIAGNOSIS — R002 Palpitations: Secondary | ICD-10-CM

## 2024-03-26 DIAGNOSIS — Z79899 Other long term (current) drug therapy: Secondary | ICD-10-CM | POA: Diagnosis not present

## 2024-03-26 DIAGNOSIS — F32A Depression, unspecified: Secondary | ICD-10-CM | POA: Diagnosis not present

## 2024-03-26 DIAGNOSIS — R079 Chest pain, unspecified: Secondary | ICD-10-CM

## 2024-03-26 DIAGNOSIS — R0789 Other chest pain: Secondary | ICD-10-CM | POA: Diagnosis not present

## 2024-03-26 DIAGNOSIS — F419 Anxiety disorder, unspecified: Secondary | ICD-10-CM

## 2024-03-26 DIAGNOSIS — Z720 Tobacco use: Secondary | ICD-10-CM | POA: Diagnosis not present

## 2024-03-26 DIAGNOSIS — R7989 Other specified abnormal findings of blood chemistry: Secondary | ICD-10-CM | POA: Diagnosis not present

## 2024-03-26 LAB — ECHOCARDIOGRAM COMPLETE
AR max vel: 2 cm2
AV Area VTI: 1.88 cm2
AV Area mean vel: 2 cm2
AV Mean grad: 4 mmHg
AV Peak grad: 7.3 mmHg
Ao pk vel: 1.35 m/s
Area-P 1/2: 3.5 cm2
Height: 61 in
S' Lateral: 2.6 cm
Weight: 1679.02 [oz_av]

## 2024-03-26 LAB — BASIC METABOLIC PANEL WITH GFR
Anion gap: 8 (ref 5–15)
BUN: 10 mg/dL (ref 8–23)
CO2: 27 mmol/L (ref 22–32)
Calcium: 9.2 mg/dL (ref 8.9–10.3)
Chloride: 104 mmol/L (ref 98–111)
Creatinine, Ser: 0.7 mg/dL (ref 0.44–1.00)
GFR, Estimated: >60 mL/min (ref >60)
Glucose, Bld: 86 mg/dL (ref 70–99)
Potassium: 4 mmol/L (ref 3.5–5.1)
Sodium: 139 mmol/L (ref 135–145)

## 2024-03-26 LAB — MAGNESIUM: Magnesium: 2 mg/dL (ref 1.7–2.4)

## 2024-03-26 MED ORDER — VENTOLIN HFA 108 (90 BASE) MCG/ACT IN AERS: 2.0000 | INHALATION_SPRAY | Freq: Four times a day (QID) | RESPIRATORY_TRACT | 1 refills | Status: DC | PRN

## 2024-03-26 MED ORDER — ALPRAZOLAM ER 0.5 MG PO TB24: 0.5000 mg | ORAL_TABLET | Freq: Every evening | ORAL | Status: DC

## 2024-03-26 MED ORDER — PANTOPRAZOLE SODIUM 40 MG PO TBEC: 40.0000 mg | DELAYED_RELEASE_TABLET | Freq: Two times a day (BID) | ORAL | 2 refills | Status: DC

## 2024-03-26 MED ORDER — ALPRAZOLAM ER 1 MG PO TB24: 1.0000 mg | ORAL_TABLET | Freq: Every morning | ORAL | Status: DC

## 2024-03-26 NOTE — Discharge Instructions (Addendum)
 F/u with outpatient psych provider.

## 2024-03-26 NOTE — Discharge Summary (Signed)
 Physician Discharge Summary   Patient: Christina Esparza MRN: [id number] DOB: [birth year]  Admit date:     03/24/2024  Discharge date: 03/26/24  Discharge Physician: Justina Oman   PCP: Kathyleen Parkins, MD   Recommendations at discharge:  Continue assisting patient with tobacco cessation Repeat basic metabolic panel to follow ultralights renal function Make sure patient follow-up with cardiology service as instructed Continue assisting patient with management of her mood disorder and  adjust anxiolytic therapy.  Discharge Diagnoses: Principal Problem:   Elevated troponin Active Problems:   Anxiety   Tobacco use   Atypical chest pain  Brief hospital admission narrative: As per H&P written by Dr. Elyse Hand on 03/24/2024 Christina Esparza is a 63 y.o. female with medical history significant of anxiety, GERD, asthma who presents to the emergency department with concern for benzodiazepine withdrawal.  Patient has been on 1 mg of Xanax 2-4 times daily for several years and this was recently changed by PCP to extended release of Xanax with 1 mg in the morning and 0.5 mg in evening.  She complained of chest tightness and pressure that was constant with sensation of jitteriness, palpitations, nausea, anxiety and increased heart rate.  She endorsed use of tobacco, but denies alcohol or any recreational drug use.  Patient denies vomiting, nausea, headache.  She called PCP who asked her to go to the ER for further evaluation.   ED Course:  In the emergency department, she was tachycardic with pulse of 104 bpm, BP 155/90, other vitals were within normal range.  CBC was normal, BMP was normal except for sodium of 133, D-dimer 0.30, magnesium  1.9, troponin 108 > 195 urine drug screen was positive for benzodiazepine, TSH 1.635, free T4 -0.76 Chest x-ray showed mild changes of COPD and mild bronchitis changes Patient was treated with Valium, Zofran  and IV hydration was provided.  TRH was asked to admit  patient.    Assessment and Plan: 1-atypical chest pain with elevated troponin - Risk factor III-IV - Case discussed with cardiology service  -2D echo reassuring and not demonstrating any wall motion abnormalities or significant valvular disorder.  Preserved ejection fraction. - Presentation most likely driven by presumed benzodiazepine withdrawal and demand ischemia associated with palpitations -After receiving immediate release Xanax at adequate dose patient's symptoms drastically improved and not ischemic process where appreciated on telemetry throughout hospitalization. - Continue patient follow-up with cardiology service.   2-GERD - Continue adjusted dose of PPI. -Dose adjusted to twice a day for better symptomatic management   3-tobacco abuse - Cessation counseling provided - Patient declined nicotine patch at the moment.   4-anxiety - With presumed benzodiazepine withdrawal after experiencing adjustment in her chronic Xanax dosage and transition to extended release form from immediate release form. - Continue current benzodiazepine therapy - Outpatient follow-up with psychiatry service recommended and discussed - Patient will require to be weaned off slowly if changes to her benzodiazepine therapy is planned.   Consultants: Cardiology service. Procedures performed: See below for x-ray reports. Disposition: Home Diet recommendation: Regular diet.  DISCHARGE MEDICATION: Allergies as of 03/26/2024       Reactions   Penicillins Anaphylaxis   Felt like she was choking   Amitiza  [lubiprostone ] Itching   Patient states that she experienced a pin sticking sensation, prickling all over.   Cat Dander Other (See Comments)   Almost like seasonal allergies   Codeine Nausea And Vomiting   Linaclotide Nausea Only   Omeprazole  Other (See Comments)   Only high doses  Dizziness, "felt funny"   Trelegy Ellipta  [fluticasone -umeclidin-vilant] Other (See Comments)   Burning in the throat    Ceftin [cefuroxime] Itching   Dog Epithelium Other (See Comments)   Almost like seasonal allergies   Levaquin [levofloxacin In D5w] Other (See Comments)   Made her dream and she usually doesn't usually dream        Medication List     TAKE these medications    ALPRAZolam 1 MG tablet Commonly known as: XANAX Take 1 mg by mouth 3 (three) times daily as needed for anxiety. What changed: Another medication with the same name was changed. Make sure you understand how and when to take each.   ALPRAZolam 1 MG 24 hr tablet Commonly known as: XANAX XR Take 1 tablet (1 mg total) by mouth every morning. Hold this medication until further discussion with PCP/Psychiatrist What changed: additional instructions   ALPRAZolam 0.5 MG 24 hr tablet Commonly known as: XANAX XR Take 1 tablet (0.5 mg total) by mouth every evening. Hold medication until follow up with PCP/Psychiatrist What changed: additional instructions   azelastine  0.1 % nasal spray Commonly known as: ASTELIN  PLACE 1 SPRAY INTO BOTH NOSTRILS TWICE DAILY   Breztri  Aerosphere 160-9-4.8 MCG/ACT Aero inhaler Generic drug: budeson-glycopyrrolate-formoterol  Inhale 2 puffs into the lungs in the morning and at bedtime.   EPINEPHrine  0.3 mg/0.3 mL Soaj injection Commonly known as: Auvi-Q  Inject 0.3 mg into the muscle as needed for anaphylaxis.   FLUoxetine 40 MG capsule Commonly known as: PROZAC Take 40 mg by mouth daily.   Gemtesa  75 MG Tabs Generic drug: Vibegron  Take 1 tablet (75 mg total) by mouth daily.   pantoprazole  40 MG tablet Commonly known as: PROTONIX  Take 1 tablet (40 mg total) by mouth 2 (two) times daily. What changed:  medication strength how much to take when to take this   Ventolin  HFA 108 (90 Base) MCG/ACT inhaler Generic drug: albuterol  Inhale 2 puffs into the lungs every 6 (six) hours as needed for wheezing or shortness of breath. What changed: when to take this   Xiidra 5 % Soln Generic drug:  Lifitegrast Apply 1 drop to eye every 12 (twelve) hours.        Follow-up Information     Kathyleen Parkins, MD. Schedule an appointment as soon as possible for a visit in 10 day(s).   Specialty: Internal Medicine Contact information: [address] [place] [address] [phone]                Discharge Exam: Cleavon Curls Weights   03/24/24 1351  Weight: 47.6 kg   General exam: Alert, awake, oriented x 3 and in no acute distress. Respiratory system: Clear to auscultation. Respiratory effort normal.  Good saturation on room air. Cardiovascular system:RRR. No rubs or gallops appreciated on exam. Gastrointestinal system: Abdomen is nondistended, soft and nontender.  Positive bowel sounds. Central nervous system: Moving 4 limbs spontaneously.  No focal neurological deficits. Extremities: No cyanosis, clubbing or edema. Skin: No petechiae. Psychiatry: Judgement and insight appear normal.  Slightly anxious on exam.  Condition at discharge: Stable and improved.  The results of significant diagnostics from this hospitalization (including imaging, microbiology, ancillary and laboratory) are listed below for reference.   Imaging Studies: ECHOCARDIOGRAM COMPLETE Result Date: 03/26/2024    ECHOCARDIOGRAM REPORT   Patient Name:   LANIKA PALLEY Date of Exam: 03/26/2024 Medical Rec #:  [id number]       Height:       61.0 in Accession #:  [id number]      Weight:       104.9 lb Date of Birth:  06-21-1961      BSA:          1.436 m Patient Age:    62 years        BP:           126/69 mmHg Patient Gender: F               HR:           97 bpm. Exam Location:  [facility] Procedure: 2D Echo, Cardiac Doppler and Color Doppler (Both Spectral and Color            Flow Doppler were utilized during procedure). Indications:    Chest Pain  History:        Patient has no prior history of Echocardiogram examinations.                 Risk Factors:Current Smoker.  Sonographer:    Astrid Blamer Referring  Phys: [id number] Dorma Gash IMPRESSIONS  1. Left ventricular ejection fraction, by estimation, is 60 to 65%. The left ventricle has normal function. Left ventricular endocardial border not optimally defined to evaluate regional wall motion. Left ventricular diastolic parameters are consistent with Grade I diastolic dysfunction (impaired relaxation).  2. Right ventricular systolic function is normal. The right ventricular size is not well visualized. There is normal pulmonary artery systolic pressure.  3. The mitral valve is normal in structure. No evidence of mitral valve regurgitation. No evidence of mitral stenosis.  4. The aortic valve was not well visualized. Aortic valve regurgitation is not visualized. No aortic stenosis is present.  5. The inferior vena cava is normal in size with greater than 50% respiratory variability, suggesting right atrial pressure of 3 mmHg. Comparison(s): No prior Echocardiogram. FINDINGS  Left Ventricle: Left ventricular ejection fraction, by estimation, is 60 to 65%. The left ventricle has normal function. Left ventricular endocardial border not optimally defined to evaluate regional wall motion. Strain was performed and the global longitudinal strain is indeterminate. The left ventricular internal cavity size was normal in size. There is no left ventricular hypertrophy. Left ventricular diastolic parameters are consistent with Grade I diastolic dysfunction (impaired relaxation). Normal left ventricular filling pressure. Right Ventricle: The right ventricular size is not well visualized. No increase in right ventricular wall thickness. Right ventricular systolic function is normal. There is normal pulmonary artery systolic pressure. The tricuspid regurgitant velocity is 2.51 m/s, and with an assumed right atrial pressure of 3 mmHg, the estimated right ventricular systolic pressure is 28.2 mmHg. Left Atrium: Left atrial size was normal in size. Right Atrium: Right atrial size  was normal in size. Pericardium: There is no evidence of pericardial effusion. Mitral Valve: The mitral valve is normal in structure. No evidence of mitral valve regurgitation. No evidence of mitral valve stenosis. Tricuspid Valve: The tricuspid valve is normal in structure. Tricuspid valve regurgitation is trivial. No evidence of tricuspid stenosis. Aortic Valve: The aortic valve was not well visualized. Aortic valve regurgitation is not visualized. No aortic stenosis is present. Aortic valve mean gradient measures 4.0 mmHg. Aortic valve peak gradient measures 7.3 mmHg. Aortic valve area, by VTI measures 1.88 cm. Pulmonic Valve: The pulmonic valve was not well visualized. Pulmonic valve regurgitation is not visualized. No evidence of pulmonic stenosis. Aorta: The aortic root is normal in size and structure. Venous: The inferior vena cava is normal in size with greater  than 50% respiratory variability, suggesting right atrial pressure of 3 mmHg. IAS/Shunts: No atrial level shunt detected by color flow Doppler. Additional Comments: 3D was performed not requiring image post processing on an independent workstation and was indeterminate.  LEFT VENTRICLE PLAX 2D LVIDd:         4.00 cm   Diastology LVIDs:         2.60 cm   LV e' medial:    6.85 cm/s LV PW:         0.60 cm   LV E/e' medial:  9.3 LV IVS:        0.90 cm   LV e' lateral:   12.50 cm/s LVOT diam:     1.70 cm   LV E/e' lateral: 5.1 LV SV:         44 LV SV Index:   31 LVOT Area:     2.27 cm  RIGHT VENTRICLE RV S prime:     13.30 cm/s TAPSE (M-mode): 2.1 cm LEFT ATRIUM             Index        RIGHT ATRIUM           Index LA Vol (A2C):   31.1 ml 21.66 ml/m  RA Area:     11.90 cm LA Vol (A4C):   22.0 ml 15.32 ml/m  RA Volume:   29.90 ml  20.82 ml/m LA Biplane Vol: 26.3 ml 18.32 ml/m  AORTIC VALVE AV Area (Vmax):    2.00 cm AV Area (Vmean):   2.00 cm AV Area (VTI):     1.88 cm AV Vmax:           135.00 cm/s AV Vmean:          95.700 cm/s AV VTI:             0.233 m AV Peak Grad:      7.3 mmHg AV Mean Grad:      4.0 mmHg LVOT Vmax:         119.00 cm/s LVOT Vmean:        84.400 cm/s LVOT VTI:          0.193 m LVOT/AV VTI ratio: 0.83  AORTA Ao Root diam: 2.60 cm MITRAL VALVE               TRICUSPID VALVE MV Area (PHT): 3.50 cm    TR Peak grad:   25.2 mmHg MV Decel Time: 217 msec    TR Vmax:        251.00 cm/s MV E velocity: 63.80 cm/s MV A velocity: 78.00 cm/s  SHUNTS MV E/A ratio:  0.82        Systemic VTI:  0.19 m                            Systemic Diam: 1.70 cm Vishnu Priya Mallipeddi Electronically signed by Lucetta Russel Mallipeddi Signature Date/Time: 03/26/2024/11:19:26 AM    Final    DG Chest 2 View Result Date: 03/24/2024 CLINICAL DATA:  Chest pain and shortness of breath.  Smoker. EXAM: CHEST - 2 VIEW COMPARISON:  08/29/2020 FINDINGS: Normal sized heart. Clear lungs with normal vascularity. The lungs are mildly hyperexpanded with mild peribronchial thickening. No pneumothorax or pleural fluid. Unremarkable bones. IMPRESSION: Mild changes of COPD and mild bronchitic changes. Electronically Signed   By: Catherin Closs M.D.   On: 03/24/2024 14:22    Microbiology: Results for  orders placed or performed in visit on 12/30/23  Microscopic Examination     Status: Abnormal   Collection Time: 12/30/23 11:16 AM   Urine  Result Value Ref Range Status   WBC, UA 0-5 0 - 5 /hpf Final   RBC, Urine 3-10 (A) 0 - 2 /hpf Final   Epithelial Cells (non renal) 0-10 0 - 10 /hpf Final   Bacteria, UA None seen None seen/Few Final    Labs: CBC: Recent Labs  Lab 03/24/24 1436 03/25/24 0410  WBC 6.3 4.3  HGB 12.6 11.7*  HCT 38.3 37.1  MCV 94.3 94.6  PLT 263 240   Basic Metabolic Panel: Recent Labs  Lab 03/24/24 1434 03/24/24 1436 03/25/24 0410 03/26/24 0455  NA  --  133* 140 139  K  --  4.6 3.5 4.0  CL  --  99 103 104  CO2  --  23 26 27   GLUCOSE  --  96 83 86  BUN  --  14 10 10   CREATININE  --  0.84 0.70 0.70  CALCIUM  --  9.5 9.2 9.2  MG 1.9  --   1.7 2.0  PHOS  --   --  3.7  --    Liver Function Tests: Recent Labs  Lab 03/25/24 0410  AST 18  ALT 12  ALKPHOS 48  BILITOT 0.6  PROT 6.7  ALBUMIN 3.9   CBG: No results for input(s): "GLUCAP" in the last 168 hours.  Discharge time spent: greater than 30 minutes.  Signed: Justina Oman, MD [facility] 03/26/2024

## 2024-03-26 NOTE — ED Provider Notes (Signed)
 Behavioral Health Urgent Care Medical Screening Exam  Patient Name: Christina Esparza MRN: [id number] Date of Evaluation: 03/27/24 Chief Complaint:  looking to get a new medication  Diagnosis:  Final diagnoses:  Medication management  Anxiety and depression    History of Present illness: Christina Esparza is a 63 y.o. female. With a history of GAD, mdd and anxiety prsented to [facility] voluntarily accompanied by her husband.  Per the patient she has been having anxiety and increased panic attacks, according to her she takes Xanax  for 30 years and her psychiatrist is trying to decrease her medication.  Per the patient he puts her on a different medicine and she does not think the medicine is working so she is trying to see if we can put her on something stronger.  Writer discussed with patient that she needs to have this conversation with her psychiatrist so that they can formulate a plan of care for her.  Patient currently sees Dr. Anastasio Kaska in [place] for medication management.  Patient does not appear to be in any distress does not seem to be in any withdrawal.   Face-to-face observation of patient, patient is alert and oriented x 4, speech is clear, maintaining eye contact.  Patient overall is pleasant answer questions appropriately.  Patient denies SI, HI, AVH or paranoia.  Denies alcohol use.  Denies illicit drug use.  Patient does not seem to be in any distress does not seem to be a threat to herself or others.  Does not appear to be influenced by internal or external stimuli at this time.  Patient is advised to call 911 or return to the nearest ED should she experience any suicidal ideation homicidal thoughts or hallucination.  Patient verbalized understanding. At the end of this evaluation patient does not seem to be a risk to herself or others and can safely be discharged to follow-up with her psychiatrist for medication management.  Recommend discharge  Flowsheet Row ED from 03/26/2024 in  [facility] ED to Hosp-Admission (Discharged) from 03/24/2024 in [facility] ED from 10/14/2023 in [facility] Emergency Department at [facility]  C-SSRS RISK CATEGORY No Risk No Risk No Risk       Psychiatric Specialty Exam  Presentation  General Appearance:Casual  Eye Contact:Good  Speech:Clear and Coherent  Speech Volume:Normal  Handedness:Right   Mood and Affect  Mood: Anxious  Affect: Congruent   Thought Process  Thought Processes: Coherent  Descriptions of Associations:Intact  Orientation:Full (Time, Place and Person)  Thought Content:Logical    Hallucinations:None  Ideas of Reference:None  Suicidal Thoughts:No  Homicidal Thoughts:No   Sensorium  Memory: Immediate Fair  Judgment: Fair  Insight: Good   Executive Functions  Concentration: Good  Attention Span: Good  Recall: Good  Fund of Knowledge: Good  Language: Good   Psychomotor Activity  Psychomotor Activity: Normal   Assets  Assets: Communication Skills   Sleep  Sleep: Fair  Number of hours:  8   Physical Exam: Physical Exam HENT:     Head: Normocephalic.     Nose: Nose normal.  Eyes:     Pupils: Pupils are equal, round, and reactive to light.  Cardiovascular:     Rate and Rhythm: Tachycardia present.  Pulmonary:     Effort: Pulmonary effort is normal.  Musculoskeletal:        General: Normal range of motion.     Cervical back: Normal range of motion.  Neurological:  General: No focal deficit present.     Mental Status: She is alert.  Psychiatric:        Mood and Affect: Mood normal.    Review of Systems  Constitutional: Negative.   HENT: Negative.    Eyes: Negative.   Respiratory: Negative.    Cardiovascular: Negative.   Gastrointestinal: Negative.   Genitourinary: Negative.   Musculoskeletal: Negative.   Skin: Negative.   Neurological: Negative.   Psychiatric/Behavioral:   Positive for depression. The patient is nervous/anxious.    Blood pressure 109/78, pulse 89, temperature 98.3 F (36.8 C), temperature source Oral, resp. rate 18, SpO2 93%. There is no height or weight on file to calculate BMI.  Musculoskeletal: Strength & Muscle Tone: within normal limits Gait & Station: normal Patient leans: N/A   [facility] MSE Discharge Disposition for Follow up and Recommendations: Based on my evaluation the patient does not appear to have an emergency medical condition and can be discharged with resources and follow up care in outpatient services for Medication Management   Dorthea Gauze, NP 03/27/2024, 5:36 AM

## 2024-03-26 NOTE — Plan of Care (Signed)
  Problem: Education: Goal: Knowledge of General Education information will improve Description: Including pain rating scale, medication(s)/side effects and non-pharmacologic comfort measures Outcome: Progressing   Problem: Health Behavior/Discharge Planning: Goal: Ability to manage health-related needs will improve Outcome: Progressing   Problem: Clinical Measurements: Goal: Will remain free from infection Outcome: Progressing Goal: Diagnostic test results will improve Outcome: Progressing Goal: Respiratory complications will improve Outcome: Progressing Goal: Cardiovascular complication will be avoided Outcome: Progressing   Problem: Pain Managment: Goal: General experience of comfort will improve and/or be controlled Outcome: Progressing   Problem: Safety: Goal: Ability to remain free from injury will improve Outcome: Progressing   Problem: Skin Integrity: Goal: Risk for impaired skin integrity will decrease Outcome: Progressing

## 2024-03-26 NOTE — Progress Notes (Signed)
 Please schedule patient for Exercise Myoview per Dr. Mallipeddi. Can plan for Lexiscan if unable to tolerate.   Signed,   Metta Actis, PA-C  03/26/2024, 11:05 AM    Order entered.

## 2024-03-26 NOTE — Progress Notes (Signed)
   03/26/24 2022  [facility] Triage Screening (Walk-ins at [facility] only)  What Is the Reason for Your Visit/Call Today? Pt arrived to [facility] voluntarily with concerns of high stress and medicatiuon management. She stated that she had taken xanax for 30 years for her anxiety and depression and it was recently switched. She was admitted to the ED and stayed for 2 nights, they stated that she was going through withdrawal symptoms from xanax reporting chest palpatations and a high level of racing thoughts. She states that the thoughts are grief related but had not SI or thoughts of self harm. She states that she was diagnosed with anxiety, major depressive disorder, phobias, and more. Pt. denies HI, SI, Avh, and substance/alcohol use within the last 24 hours.  How Long Has This Been Causing You Problems? 1-6 months  Have You Recently Had Any Thoughts About Hurting Yourself? No  Are You Planning to Commit Suicide/Harm Yourself At This time? No  Have you Recently Had Thoughts About Hurting Someone Marigene Shoulder? No  Are You Planning To Harm Someone At This Time? No  Physical Abuse Denies  Verbal Abuse Denies  Sexual Abuse Denies  Exploitation of patient/patient's resources Denies  Self-Neglect Denies  Are you currently experiencing any auditory, visual or other hallucinations? No  Have You Used Any Alcohol or Drugs in the Past 24 Hours? No  Do you have any current medical co-morbidities that require immediate attention? Yes  Please describe current medical co-morbidities that require immediate attention: COPD  Clinician description of patient physical appearance/behavior: nervous, anxious, and rattled  What Do You Feel Would Help You the Most Today? Medication(s);Social Support;Stress Management  If access to [facility] was not available, would you have sought care in the Emergency Department? No  Determination of Need Urgent (48 hours)  Options For Referral Outpatient Therapy;Other: Comment;Medication Management;BH  Urgent Care  Determination of Need filed? Yes

## 2024-03-31 ENCOUNTER — Encounter (HOSPITAL_COMMUNITY)

## 2024-03-31 ENCOUNTER — Ambulatory Visit (HOSPITAL_COMMUNITY): Admit: 2024-03-31

## 2024-04-07 ENCOUNTER — Telehealth: Payer: Self-pay | Admitting: *Deleted

## 2024-04-07 ENCOUNTER — Encounter: Admitting: Acute Care

## 2024-04-07 NOTE — Telephone Encounter (Signed)
 Per [name] Parrett:  Patient has moderate OS, will discuss at upcoming appointment on 05/12/24.

## 2024-04-07 NOTE — Telephone Encounter (Signed)
 Copied from CRM [id number]. Topic: Clinical - Lab/Test Results >> Apr 01, 2024 11:06 AM Tyronne Galloway wrote: Reason for CRM: Patient is requesting a phone call back from NP [name] Parrett or a nurse as the patient states [name] told her she would get her sleep study results from [facility] (where the sleep study test was done) and go over them and advise her based off those results. The results were sent in manually by the pt about two weeks ago and she still hasn't received a call. I let the pt know that typically results would go back to her PCP, but the patient insisted it would be best to receive a call from the clinic. Please call the pt back at [phone] ok to leave a vm.  See phone note 04/07/24 I called and spoke with her and notified [name] will go over results at her June visit:   Per [name] Parrett:  Patient has moderate OS, will discuss at upcoming appointment on 05/12/24.

## 2024-04-08 ENCOUNTER — Ambulatory Visit (HOSPITAL_COMMUNITY)
Admission: RE | Admit: 2024-04-08 | Discharge: 2024-04-08 | Disposition: A | Discharge status: Home / Self Care | Source: Ambulatory Visit | Attending: Acute Care | Admitting: Acute Care

## 2024-04-08 ENCOUNTER — Ambulatory Visit: Admitting: Acute Care

## 2024-04-08 ENCOUNTER — Encounter: Payer: Self-pay | Admitting: Acute Care

## 2024-04-08 DIAGNOSIS — Z122 Encounter for screening for malignant neoplasm of respiratory organs: Secondary | ICD-10-CM | POA: Insufficient documentation

## 2024-04-08 DIAGNOSIS — Z87891 Personal history of nicotine dependence: Secondary | ICD-10-CM | POA: Insufficient documentation

## 2024-04-08 DIAGNOSIS — F17210 Nicotine dependence, cigarettes, uncomplicated: Secondary | ICD-10-CM | POA: Diagnosis not present

## 2024-04-08 NOTE — Patient Instructions (Signed)

## 2024-04-08 NOTE — Progress Notes (Signed)
 Virtual Visit via Telephone Note  I connected with Christina Esparza on 04/08/24 at  8:30 AM EDT by a video enabled telemedicine application and verified that I am speaking with the correct person using two identifiers.  Location: Patient:  At home Provider: [address], [place], [place], [address]    I discussed the limitations of evaluation and management by telemedicine and the availability of in person appointments. The patient expressed understanding and agreed to proceed.  Shared Decision Making Visit Lung Cancer Screening Program [phone])   Eligibility: Age 63 y.o. Pack Years Smoking History Calculation 74 pack year smoking history (# packs/per year x # years smoked) Recent History of coughing up blood  no Unexplained weight loss? no ( >Than 15 pounds within the last 6 months ) Prior History Lung / other cancer no (Diagnosis within the last 5 years already requiring surveillance chest CT Scans). Smoking Status Current Smoker Former Smokers: Years since quit:  NA  Quit Date:  NA  Visit Components: Discussion included one or more decision making aids. yes Discussion included risk/benefits of screening. yes Discussion included potential follow up diagnostic testing for abnormal scans. yes Discussion included meaning and risk of over diagnosis. yes Discussion included meaning and risk of False Positives. yes Discussion included meaning of total radiation exposure. yes  Counseling Included: Importance of adherence to annual lung cancer LDCT screening. yes Impact of comorbidities on ability to participate in the program. yes Ability and willingness to under diagnostic treatment. yes  Smoking Cessation Counseling: Current Smokers:  Discussed importance of smoking cessation. yes Information about tobacco cessation classes and interventions provided to patient. yes Patient provided with "ticket" for LDCT Scan. yes Symptomatic Patient. no  Counseling NA Diagnosis  Code: Tobacco Use Z72.0 Asymptomatic Patient yes  Counseling (Intermediate counseling: > three minutes counseling) W0981 Former Smokers:  Discussed the importance of maintaining cigarette abstinence. yes Diagnosis Code: Personal History of Nicotine Dependence. X91.478 Information about tobacco cessation classes and interventions provided to patient. Yes Patient provided with "ticket" for LDCT Scan. yes Written Order for Lung Cancer Screening with LDCT placed in Epic. Yes (CT Chest Lung Cancer Screening Low Dose W/O CM) [id number] Z12.2-Screening of respiratory organs Z87.891-Personal history of nicotine dependence  Counseled to quit smoking x 3 minutes   Raejean Bullock, NP

## 2024-04-15 NOTE — Progress Notes (Deleted)
   [address] [place] [address] Dept: [phone]  FOLLOW UP NOTE  Patient ID: Christina Esparza, female    DOB: 18-Dec-1960  Age: 63 y.o. MRN: [id number] Date of Office Visit: 04/17/2024  Assessment  Chief Complaint: No chief complaint on file.  HPI Christina Esparza is a 63 year old female who presents to the clinic for follow-up visit.  She was last seen in this clinic on 08/09/2019 for by Dr. Idolina Maker for evaluation of asthma COPD overlap syndrome, smoking, allergic rhinitis, polypoid degeneration, and alpha 1 antitrypsin deficiency phenotype MZ. Her last environmental allergy testing was on 07/13/2020 was positive to dog and cat.  Discussed the use of AI scribe software for clinical note transcription with the patient, who gave verbal consent to proceed.  History of Present Illness      Drug Allergies:  Allergies  Allergen Reactions   Penicillins Anaphylaxis    Felt like she was choking   Amitiza  [Lubiprostone ] Itching    Patient states that she experienced a pin sticking sensation, prickling all over.   Cat Dander Other (See Comments)    Almost like seasonal allergies    Codeine Nausea And Vomiting   Linaclotide Nausea Only   Omeprazole  Other (See Comments)    Only high doses Dizziness, "felt funny"   Trelegy Ellipta  [Fluticasone -Umeclidin-Vilant] Other (See Comments)    Burning in the throat   Ceftin [Cefuroxime] Itching   Dog Epithelium Other (See Comments)    Almost like seasonal allergies   Levaquin [Levofloxacin In D5w] Other (See Comments)    Made her dream and she usually doesn't usually dream    Physical Exam: There were no vitals taken for this visit.   Physical Exam  Diagnostics:    Assessment and Plan: No diagnosis found.  No orders of the defined types were placed in this encounter.   There are no Patient Instructions on file for this visit.  No follow-ups on file.    Thank you for the opportunity to care for this  patient.  Please do not hesitate to contact me with questions.  Marinus Sic, FNP [facility]

## 2024-04-15 NOTE — Patient Instructions (Incomplete)
 Asthma/COPD overlap syndrome Continue Trelegy 1 puff once a day to prevent cough or wheeze.  Complete the application for free or reduced medication from [employer] Continue tezspire  injections once every 4 weeks Continue albuterol  2 puffs once every 4 hours as needed for cough or wheeze You may use albuterol  2 puffs 5 to 15 minutes before activity to decrease cough or wheeze Call the clinic if your symptoms worsen or do not improve or if you develop a fever  Allergic rhinitis Continue allergen avoidance measures directed toward dog and cat as listed below Continue Allegra 180 mg once a day as needed for runny nose or itch Continue Xhance  2 sprays in each nostril up to twice a day for nasal congestion Consider saline nasal rinses as needed for nasal symptoms. Use this before any medicated nasal sprays for best result Restart your allergy injections when you are feeling better  Eustachian tube dysfunction Continue Xhance  and nasal saline rinses as listed above  Reflux Continue dietary and lifestyle modifications as listed below Continue lansoprazole as previously prescribed  Tobacco use Smoking cessation written information provided from up-to-date Try to cut down and best to quit smoking  Call the clinic if this treatment plan is not working well for you.  Follow up in 1 month or sooner if needed.  Control of Dog or Cat Allergen Avoidance is the best way to manage a dog or cat allergy. If you have a dog or cat and are allergic to dog or cats, consider removing the dog or cat from the home. If you have a dog or cat but don't want to find it a new home, or if your family wants a pet even though someone in the household is allergic, here are some strategies that may help keep symptoms at bay:  Keep the pet out of your bedroom and restrict it to only a few rooms. Be advised that keeping the dog or cat in only one room will not limit the allergens to that room. Don't pet, hug or kiss  the dog or cat; if you do, wash your hands with soap and water . High-efficiency particulate air (HEPA) cleaners run continuously in a bedroom or living room can reduce allergen levels over time. Regular use of a high-efficiency vacuum cleaner or a central vacuum can reduce allergen levels. Giving your dog or cat a bath at least once a week can reduce airborne allergen.

## 2024-04-16 ENCOUNTER — Telehealth: Payer: Self-pay | Admitting: Urology

## 2024-04-16 NOTE — Telephone Encounter (Signed)
 Patient made aware samples will be placed up front and providers CMA will f/u on gemtesa  appeal.

## 2024-04-16 NOTE — Telephone Encounter (Signed)
 Needs samples of Gemtesa . She has been out and they were working but now that she has been off she is having problems again. She said Isa Manuel said we could appeal insurance. The pharmacy wanted $600.

## 2024-04-17 ENCOUNTER — Ambulatory Visit: Admitting: Family Medicine

## 2024-04-20 NOTE — Telephone Encounter (Signed)
 Tried calling patient with no answer. LVM for return call "I have no supporting documentation to provide for a Gemtesa  appeal at this time; advise f/u appointment to discuss. I only saw her once >6 months ago for hematuria. At that time she reported significant caffeine intake and was informed to minimize caffeine intake since that is a well known contributing factor for OAB symptoms."

## 2024-04-27 ENCOUNTER — Other Ambulatory Visit: Payer: Self-pay | Admitting: Acute Care

## 2024-04-27 DIAGNOSIS — Z87891 Personal history of nicotine dependence: Secondary | ICD-10-CM

## 2024-04-27 DIAGNOSIS — Z122 Encounter for screening for malignant neoplasm of respiratory organs: Secondary | ICD-10-CM

## 2024-04-27 DIAGNOSIS — F17210 Nicotine dependence, cigarettes, uncomplicated: Secondary | ICD-10-CM

## 2024-05-12 ENCOUNTER — Telehealth: Payer: Self-pay

## 2024-05-12 ENCOUNTER — Ambulatory Visit (INDEPENDENT_AMBULATORY_CARE_PROVIDER_SITE_OTHER): Admitting: Adult Health

## 2024-05-12 ENCOUNTER — Encounter: Payer: Self-pay | Admitting: Adult Health

## 2024-05-12 ENCOUNTER — Encounter (INDEPENDENT_AMBULATORY_CARE_PROVIDER_SITE_OTHER): Payer: Self-pay | Admitting: *Deleted

## 2024-05-12 VITALS — BP 109/64 | HR 94 | Ht 61.0 in | Wt 101.6 lb

## 2024-05-12 DIAGNOSIS — G4734 Idiopathic sleep related nonobstructive alveolar hypoventilation: Secondary | ICD-10-CM

## 2024-05-12 DIAGNOSIS — J3089 Other allergic rhinitis: Secondary | ICD-10-CM

## 2024-05-12 DIAGNOSIS — F17210 Nicotine dependence, cigarettes, uncomplicated: Secondary | ICD-10-CM

## 2024-05-12 DIAGNOSIS — F419 Anxiety disorder, unspecified: Secondary | ICD-10-CM

## 2024-05-12 DIAGNOSIS — B370 Candidal stomatitis: Secondary | ICD-10-CM

## 2024-05-12 DIAGNOSIS — J4489 Other specified chronic obstructive pulmonary disease: Secondary | ICD-10-CM

## 2024-05-12 MED ORDER — VENTOLIN HFA 108 (90 BASE) MCG/ACT IN AERS: 2.0000 | INHALATION_SPRAY | Freq: Four times a day (QID) | RESPIRATORY_TRACT | 3 refills | Status: DC | PRN

## 2024-05-12 MED ORDER — BREZTRI AEROSPHERE 160-9-4.8 MCG/ACT IN AERO: 2.0000 | INHALATION_SPRAY | Freq: Two times a day (BID) | RESPIRATORY_TRACT | 5 refills | Status: DC

## 2024-05-12 MED ORDER — CLOTRIMAZOLE 10 MG MT TROC
10.0000 mg | Freq: Every day | OROMUCOSAL | 0 refills | Status: DC
Start: 2024-05-12 — End: 2024-08-03

## 2024-05-12 NOTE — Patient Instructions (Addendum)
 Continue with yearly CT chest screening.  Sleep study results requested.  Continue on Breztri  2 puffs Twice daily, rinse after use.  Albuterol  inhaler as needed Activity as tolerated Work on not smoking Claritin 5mg  At bedtime  As needed   High protein diet.  Labs today  Set up for  PFT  Mycelex troche five times a day for 1 week Brush /rinse and gargle after inhaler use.  Follow up in 3 months and As needed

## 2024-05-12 NOTE — Progress Notes (Signed)
 @Patient  ID: Christina Esparza, female    DOB: 06-Sep-1961, 63 y.o.   MRN: [id number]  Chief Complaint  Patient presents with   Follow-up    Referring provider: Bertell Satterfield, MD  HPI: 63 yo female smoker followed for COPD with Emphysema and Lung nodules (stable on serial imaging), Alpha 1 MZ phenotype. Chronic allergic rhinitis followed by allergist.   TEST/EVENTS :  Intolerant to Spiriva  and Trelegy No perceived benefit with Nucala  and Tezspire .    High-resolution CT chest April 27, 2021 showed no evidence of interstitial lung disease.  Stable emphysema.  A few scattered pulmonary nodules measuring 4 mm or less.  Unchanged and consistent with benign etiology.   Spirometry 09/02/20 >> FEV1 1.47 (53%), FEV1% 59 PFT 10/28/20 >> FEV1 1.42 (63%), FEV1% 65, TLC 5.33 (115%), RV 3.10 (167%), DLCO 104% A1AT 10/28/20 >> 117, MZ A1At 01/2122 >>103    05/12/2024 Follow up ; COPD, AR  Discussed the use of AI scribe software for clinical note transcription with the patient, who gave verbal consent to proceed.  History of Present Illness   Christina Esparza is a 63 year old female with COPD and emphysema who presents for a checkup and evaluation of her COPD with Emphysema   She has been experiencing throat discomfort and difficulty swallowing over last few days. The sensation is described as a raw feeling, akin to having 'rocks' in her throat, which makes talking and swallowing difficult. She is uncertain if this is related to the inhaler.   She has a history of COPD and emphysema, with a lung function test from last year showing her lung function at 49%. She is part of a Lung Cancer Screening Program and had a CT scan in May.  She experiences daily shortness of breath, especially upon waking, and feels that her breathing difficulties are not taken seriously by others at home.  Apr 08, 2024 showed emphysema.  Stable bilateral pulmonary nodules maximum measuring 4.4 mm.  Considered a lung RADS  2.  Her current medication regimen includes Breztri , two puffs in the morning and two in the evening. She has tried other inhalers like Spiriva  and Trelegy without success and previously used Nucala  and Tezspire   injections, which did not significantly help. She is not currently using oxygen at home.  She has a history of stress and anxiety, exacerbated by personal losses, including the death of her mother from Lewy body dementia three years ago and her father's death from a heart attack eight months later. She has been under psychiatric care for stress and anxiety, which she describes as 'bad stress and anxiety all my life'.  A sleep study was done in January.  We received copies of her sleep study that showed very minimal sleep apnea with a AHI of 5.6/hour and an SpO2 low at 79%.  Time spent below 88% was 40 minutes which was 19% of the total recording time.  She is a smoker,carries the MZ phenotype for alpha-1 antitrypsin we discussed smoking cessation in detail.        Allergies  Allergen Reactions   Penicillins Anaphylaxis    Felt like she was choking   Amitiza  [Lubiprostone ] Itching    Patient states that she experienced a pin sticking sensation, prickling all over.   Cat Dander Other (See Comments)    Almost like seasonal allergies    Codeine Nausea And Vomiting   Linaclotide Nausea Only   Omeprazole  Other (See Comments)    Only high doses  Dizziness, felt funny   Trelegy Ellipta  [Fluticasone -Umeclidin-Vilant] Other (See Comments)    Burning in the throat   Ceftin [Cefuroxime] Itching   Dog Epithelium Other (See Comments)    Almost like seasonal allergies   Levaquin [Levofloxacin In D5w] Other (See Comments)    Made her dream and she usually doesn't usually dream     There is no immunization history on file for this patient.  Past Medical History:  Diagnosis Date   Anxiety    Asthma    Depression    Eczema    Urticaria     Tobacco History: Social History    Tobacco Use  Smoking Status Every Day   Current packs/day: 1.00   Average packs/day: 1 pack/day for 20.0 years (20.0 ttl pk-yrs)   Types: Cigarettes  Smokeless Tobacco Never  Tobacco Comments   Currently smokes a pack a day [facility] 06/19/21   Ready to quit: Not Answered Counseling given: Not Answered Tobacco comments: Currently smokes a pack a day [facility] 06/19/21   Outpatient Medications Prior to Visit  Medication Sig Dispense Refill   ALPRAZolam  (XANAX  XR) 0.5 MG 24 hr tablet Take 1 tablet (0.5 mg total) by mouth every evening. Hold medication until follow up with PCP/Psychiatrist     ALPRAZolam  (XANAX  XR) 1 MG 24 hr tablet Take 1 tablet (1 mg total) by mouth every morning. Hold this medication until further discussion with PCP/Psychiatrist     ALPRAZolam  (XANAX ) 1 MG tablet Take 1 mg by mouth 3 (three) times daily as needed for anxiety.     azelastine  (ASTELIN ) 0.1 % nasal spray PLACE 1 SPRAY INTO BOTH NOSTRILS TWICE DAILY 30 mL 5   EPINEPHrine  (AUVI-Q ) 0.3 mg/0.3 mL IJ SOAJ injection Inject 0.3 mg into the muscle as needed for anaphylaxis. 2 each 1   pantoprazole  (PROTONIX ) 40 MG tablet Take 1 tablet (40 mg total) by mouth 2 (two) times daily. 60 tablet 2   sertraline (ZOLOFT) 50 MG tablet Take 50 mg by mouth daily.     Vibegron  (GEMTESA ) 75 MG TABS Take 1 tablet (75 mg total) by mouth daily. 30 tablet 11   XIIDRA 5 % SOLN Apply 1 drop to eye every 12 (twelve) hours.     budeson-glycopyrrolate -formoterol  (BREZTRI  AEROSPHERE) 160-9-4.8 MCG/ACT AERO Inhale 2 puffs into the lungs in the morning and at bedtime. 1 each 5   VENTOLIN  HFA 108 (90 Base) MCG/ACT inhaler Inhale 2 puffs into the lungs every 6 (six) hours as needed for wheezing or shortness of breath. 18 g 1   FLUoxetine  (PROZAC ) 40 MG capsule Take 40 mg by mouth daily.     Facility-Administered Medications Prior to Visit  Medication Dose Route Frequency Provider Last Rate Last Admin   tezepelumab -ekko (TEZSPIRE ) 210 MG/1. syringe  210 mg  210 mg Subcutaneous Q28 days Iva Marty Saltness, MD   210 mg at 09/11/23 1044     Review of Systems:   Constitutional:   No  weight loss, night sweats,  Fevers, chills,+ fatigue, or  lassitude.  HEENT:   No headaches,  Difficulty swallowing,  Tooth/dental problems, or  Sore throat,                No sneezing, itching, ear ache, nasal congestion, post nasal drip,   CV:  No chest pain,  Orthopnea, PND, swelling in lower extremities, anasarca, dizziness, palpitations, syncope.   GI  No heartburn, indigestion, abdominal pain, nausea, vomiting, diarrhea, change in bowel habits, loss of appetite, bloody stools.  Resp:  No chest wall deformity  Skin: no rash or lesions.  GU: no dysuria, change in color of urine, no urgency or frequency.  No flank pain, no hematuria   MS:  No joint pain or swelling.  No decreased range of motion.  No back pain.    Physical Exam  BP 109/64   Pulse 94   Ht 5' 1 (1.549 m)   Wt 101 lb 9.6 oz (46.1 kg)   SpO2 93%   BMI 19.20 kg/m   GEN: A/Ox3; pleasant , NAD, thin female   HEENT:  [place]/AT,  EACs-clear, TMs-wnl, NOSE-clear, THROAT scattered white patches consistent with oral candidiasis  NECK:  Supple w/ fair ROM; no JVD; normal carotid impulses w/o bruits; no thyromegaly or nodules palpated; no lymphadenopathy.    RESP  Clear  P & A; w/o, wheezes/ rales/ or rhonchi. no accessory muscle use, no dullness to percussion  CARD:  RRR, no m/r/g, no peripheral edema, pulses intact, no cyanosis or clubbing.  GI:   Soft & nt; nml bowel sounds; no organomegaly or masses detected.   Musco: Warm bil, no deformities or joint swelling noted.   Neuro: alert, no focal deficits noted.    Skin: Warm, no lesions or rashes    Lab Results:  CBC    BNP No results found for: BNP  ProBNP No results found for: PROBNP  Imaging: No results found.  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  Patient is encouraged on smoking cessation. Continued smoking worsens emphysema and COPD, and as an alpha-1 antitrypsin deficiency carrier, she is at increased risk for progression. Smoking cessation is crucial. Order a full pulmonary function test in 3-4 months. Continue Breztri , two puffs in the morning and evening. Consider Dupixent if needed. Check alpha-1 antitrypsin level.   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 The last alpha-1 level was checked in 2023. Discuss the potential for alpha-1 replacement therapy if levels are low Check alpha-1 antitrypsin level.  Oral thrush   Oral thrush is likely due to inhaler use, with symptoms of a raw throat and difficulty swallowing. Prescribe Mycelex lozenges, five times a day for one week. Advise brushing, rinsing, and gargling after inhaler use to prevent recurrence.  Incidental findings on CT chest with atherosclerosis at gallstones.  Advised to follow-up with her primary care for ongoing management   Anxiety   Chronic anxiety is exacerbated by life stressors and smoking, Encourage smoking cessation and discuss its  potential impact on anxiety.  Follow-up with primary care and psychiatry.  Allergic rhinitis continue on Claritin daily.  Tobacco abuse-smoking cessation. Continue with  LDCT chest screening yearly.   Very Mild  OSA on home sleep study - positional sleep with head of bed elevated at 30 degrees , avoid supine sleeping  Check overnight oximetry for nocturnal hypoxemia .            I spent   43 minutes dedicated to the care of this patient on the date of this encounter to include pre-visit review of records, face-to-face time with the patient discussing conditions above, post visit ordering of testing, clinical documentation with the electronic health record, making appropriate referrals as documented, and communicating necessary findings to members of the patients care team.   Madelin Stank, NP 05/12/2024

## 2024-05-12 NOTE — Telephone Encounter (Signed)
 Called pt regarding HST results

## 2024-05-13 ENCOUNTER — Telehealth: Payer: Self-pay

## 2024-05-13 ENCOUNTER — Telehealth: Payer: Self-pay | Admitting: Adult Health

## 2024-05-13 NOTE — Telephone Encounter (Signed)
 Pt called regarding more info about the HST gave her results that [name] told me yesterday and NFN

## 2024-05-13 NOTE — Telephone Encounter (Signed)
 Spoke with patient to discuss scheduling the PFT ordered by Madelin Stank, NP---patient states she would like to call and schedule this at a later date

## 2024-05-16 LAB — CBC WITH DIFFERENTIAL/PLATELET
Basophils Absolute: 0.1 10*3/uL (ref 0.0–0.2)
Basos: 2 %
EOS (ABSOLUTE): 0.1 10*3/uL (ref 0.0–0.4)
Eos: 2 %
Hematocrit: 34.8 % (ref 34.0–46.6)
Hemoglobin: 11.1 g/dL (ref 11.1–15.9)
Immature Grans (Abs): 0 10*3/uL (ref 0.0–0.1)
Immature Granulocytes: 0 %
Lymphocytes Absolute: 1 10*3/uL (ref 0.7–3.1)
Lymphs: 24 %
MCH: 30.3 pg (ref 26.6–33.0)
MCHC: 31.9 g/dL (ref 31.5–35.7)
MCV: 95 fL (ref 79–97)
Monocytes Absolute: 0.3 10*3/uL (ref 0.1–0.9)
Monocytes: 6 %
Neutrophils Absolute: 2.7 10*3/uL (ref 1.4–7.0)
Neutrophils: 66 %
Platelets: 287 10*3/uL (ref 150–450)
RBC: 3.66 x10E6/uL — ABNORMAL LOW (ref 3.77–5.28)
RDW: 14.3 % (ref 11.7–15.4)
WBC: 4.2 10*3/uL (ref 3.4–10.8)

## 2024-05-16 LAB — ALPHA-1-ANTITRYPSIN: A-1 Antitrypsin: 107 mg/dL (ref 101–187)

## 2024-05-26 ENCOUNTER — Ambulatory Visit (INDEPENDENT_AMBULATORY_CARE_PROVIDER_SITE_OTHER): Admitting: Urology

## 2024-05-26 ENCOUNTER — Ambulatory Visit: Payer: Self-pay | Admitting: Adult Health

## 2024-05-26 ENCOUNTER — Encounter: Payer: Self-pay | Admitting: Urology

## 2024-05-26 VITALS — BP 133/69 | HR 94 | Temp 99.6°F

## 2024-05-26 DIAGNOSIS — K5900 Constipation, unspecified: Secondary | ICD-10-CM | POA: Insufficient documentation

## 2024-05-26 DIAGNOSIS — F329 Major depressive disorder, single episode, unspecified: Secondary | ICD-10-CM | POA: Insufficient documentation

## 2024-05-26 DIAGNOSIS — N3281 Overactive bladder: Secondary | ICD-10-CM | POA: Insufficient documentation

## 2024-05-26 DIAGNOSIS — N3941 Urge incontinence: Secondary | ICD-10-CM | POA: Insufficient documentation

## 2024-05-26 DIAGNOSIS — R682 Dry mouth, unspecified: Secondary | ICD-10-CM | POA: Insufficient documentation

## 2024-05-26 DIAGNOSIS — F410 Panic disorder [episodic paroxysmal anxiety] without agoraphobia: Secondary | ICD-10-CM | POA: Insufficient documentation

## 2024-05-26 LAB — URINALYSIS, ROUTINE W REFLEX MICROSCOPIC
Bilirubin, UA: NEGATIVE
Glucose, UA: NEGATIVE
Ketones, UA: NEGATIVE
Leukocytes,UA: NEGATIVE
Nitrite, UA: NEGATIVE
Protein,UA: NEGATIVE
Specific Gravity, UA: 1.01 (ref 1.005–1.030)
Urobilinogen, Ur: 0.2 mg/dL (ref 0.2–1.0)
pH, UA: 6 (ref 5.0–7.5)

## 2024-05-26 LAB — MICROSCOPIC EXAMINATION
Bacteria, UA: NONE SEEN
WBC, UA: NONE SEEN /HPF (ref 0–5)

## 2024-05-26 MED ORDER — GEMTESA 75 MG PO TABS: 1.0000 | ORAL_TABLET | Freq: Every day | ORAL | 11 refills | Status: DC

## 2024-05-26 NOTE — Progress Notes (Signed)
 Bladder Scan completed today.  Patient can void prior to the bladder scan. Bladder scan result: 3  Performed By: [facility] LPN

## 2024-05-26 NOTE — Progress Notes (Signed)
 Name: Christina Esparza DOB: Aug 10, 1961 MRN: [id number]  History of Present Illness: [name] is a 63 y.o. female who presents today for follow up visit at [facility] [place].  Relevant History includes: 1. OAB with urinary urgency, urge incontinence, and nocturnal enuresis a few times per week. - Exacerbated by her significant caffeine intake (that's all I drink). 2. Asymptomatic microscopic hematuria; 20 pack year smoking history. - 11/26/2023: CT hematuria protocol showed no acute findings. Mild bladder wall thickening - indeterminate.   At last visit with Dr. Sherrilee on 12/30/2023: Cystoscopy showed no acute findings. Plan was to follow up in 6 months with UA. She was given Gemtesa  samples for OAB symptoms.  Today: She reports that the Gemtesa  samples were helpful for her OAB symptoms but she has run out and the prescription was cost-prohibitive without insurance coverage denial. She reports working on decreasing her significant caffeine intake.  She reports ongoing nocturia x1-2, urgency, urge incontinence (particularly at night), and occasional nocturnal enuresis. Leaking 3-4x/day on average; using 3-4 pads per day on average.  She denies dysuria, gross hematuria, straining to void, or sensations of incomplete emptying.   Medications: Current Outpatient Medications  Medication Sig Dispense Refill   Vibegron  (GEMTESA ) 75 MG TABS Take 1 tablet (75 mg total) by mouth daily. 30 tablet 11   ALPRAZolam  (XANAX  XR) 0.5 MG 24 hr tablet Take 1 tablet (0.5 mg total) by mouth every evening. Hold medication until follow up with PCP/Psychiatrist     ALPRAZolam  (XANAX  XR) 1 MG 24 hr tablet Take 1 tablet (1 mg total) by mouth every morning. Hold this medication until further discussion with PCP/Psychiatrist     ALPRAZolam  (XANAX ) 1 MG tablet Take 1 mg by mouth 3 (three) times daily as needed for anxiety.     azelastine  (ASTELIN ) 0.1 % nasal spray PLACE 1 SPRAY INTO BOTH NOSTRILS  TWICE DAILY 30 mL 5   budesonide -glycopyrrolate -formoterol  (BREZTRI  AEROSPHERE) 160-9-4.8 MCG/ACT AERO inhaler Inhale 2 puffs into the lungs in the morning and at bedtime. 1 each 5   clotrimazole  (MYCELEX ) 10 MG troche Take 1 tablet (10 mg total) by mouth 5 (five) times daily. 35 Troche 0   EPINEPHrine  (AUVI-Q ) 0.3 mg/0.3 mL IJ SOAJ injection Inject 0.3 mg into the muscle as needed for anaphylaxis. 2 each 1   pantoprazole  (PROTONIX ) 40 MG tablet Take 1 tablet (40 mg total) by mouth 2 (two) times daily. 60 tablet 2   sertraline (ZOLOFT) 50 MG tablet Take 50 mg by mouth daily.     VENTOLIN  HFA 108 (90 Base) MCG/ACT inhaler Inhale 2 puffs into the lungs every 6 (six) hours as needed for wheezing or shortness of breath. 18 g 3   XIIDRA 5 % SOLN Apply 1 drop to eye every 12 (twelve) hours.     Current Facility-Administered Medications  Medication Dose Route Frequency Provider Last Rate Last Admin   tezepelumab -ekko (TEZSPIRE ) 210 MG/1. syringe 210 mg  210 mg Subcutaneous Q28 days Iva Marty Saltness, MD   210 mg at 09/11/23 1044    Allergies: Allergies  Allergen Reactions   Penicillins Anaphylaxis    Felt like she was choking   Amitiza  [Lubiprostone ] Itching    Patient states that she experienced a pin sticking sensation, prickling all over.   Cat Dander Other (See Comments)    Almost like seasonal allergies    Codeine Nausea And Vomiting   Linaclotide Nausea Only   Omeprazole  Other (See Comments)    Only high  doses Dizziness, felt funny   Trelegy Ellipta  [Fluticasone -Umeclidin-Vilant] Other (See Comments)    Burning in the throat   Ceftin [Cefuroxime] Itching   Dog Epithelium Other (See Comments)    Almost like seasonal allergies   Levaquin [Levofloxacin In D5w] Other (See Comments)    Made her dream and she usually doesn't usually dream    Past Medical History:  Diagnosis Date   Anxiety    Asthma    Depression    Eczema    Urticaria    Past Surgical History:   Procedure Laterality Date   CATARACT EXTRACTION W/PHACO Left 04/01/2023   Procedure: CATARACT EXTRACTION PHACO AND INTRAOCULAR LENS PLACEMENT (IOC);  Surgeon: Harrie Agent, MD;  Location: [facility];  Service: Ophthalmology;  Laterality: Left;  CDE: 9.02   CATARACT EXTRACTION W/PHACO Right 04/29/2023   Procedure: CATARACT EXTRACTION PHACO AND INTRAOCULAR LENS PLACEMENT (IOC);  Surgeon: Harrie Agent, MD;  Location: [facility];  Service: Ophthalmology;  Laterality: Right;  CDE: 6.45   COLONOSCOPY N/A 06/17/2013   Procedure: COLONOSCOPY;  Surgeon: Claudis RAYMOND Rivet, MD;  Location: [facility];  Service: Endoscopy;  Laterality: N/A;  730   ESOPHAGEAL BRUSHING  11/18/2019   Procedure: ESOPHAGEAL BRUSHING;  Surgeon: Rivet Claudis RAYMOND, MD;  Location: [facility];  Service: Endoscopy;;   ESOPHAGEAL DILATION N/A 11/15/2015   Procedure: ESOPHAGEAL DILATION;  Surgeon: Claudis RAYMOND Rivet, MD;  Location: [facility];  Service: Endoscopy;  Laterality: N/A;   ESOPHAGOGASTRODUODENOSCOPY N/A 11/15/2015   Procedure: ESOPHAGOGASTRODUODENOSCOPY (EGD);  Surgeon: Claudis RAYMOND Rivet, MD;  Location: [facility];  Service: Endoscopy;  Laterality: N/A;   ESOPHAGOGASTRODUODENOSCOPY N/A 11/18/2019   Procedure: ESOPHAGOGASTRODUODENOSCOPY (EGD);  Surgeon: Rivet Claudis RAYMOND, MD;  Location: [facility];  Service: Endoscopy;  Laterality: N/A;  2:25   NASAL SEPTOPLASTY W/ TURBINOPLASTY Bilateral 11/18/2018   Procedure: NASAL SEPTOPLASTY WITH BILATERAL TURBINATE REDUCTION;  Surgeon: Karis Clunes, MD;  Location: [facility];  Service: ENT;  Laterality: Bilateral;   TUBAL LIGATION     Family History  Problem Relation Age of Onset   Colon polyps Mother    Prostate cancer Father    Heart attack Father    Colon cancer Neg Hx    Social History   Socioeconomic History   Marital status: Married    Spouse name: Not on file   Number of children: Not on file   Years of education: Not on file   Highest education level: Not  on file  Occupational History   Not on file  Tobacco Use   Smoking status: Every Day    Current packs/day: 1.00    Average packs/day: 1 pack/day for 20.0 years (20.0 ttl pk-yrs)    Types: Cigarettes   Smokeless tobacco: Never   Tobacco comments:    Currently smokes a pack a day [facility] 06/19/21  Vaping Use   Vaping status: Never Used  Substance and Sexual Activity   Alcohol use: No    Alcohol/week: 0.0 standard drinks of alcohol   Drug use: No   Sexual activity: Not Currently    Birth control/protection: Surgical  Other Topics Concern   Not on file  Social History Narrative   Not on file   Social Drivers of Health   Financial Resource Strain: Not on file  Food Insecurity: Not on file  Transportation Needs: Not on file  Physical Activity: Not on file  Stress: Not on file  Social Connections: Not on file  Intimate Partner Violence: Not  At Risk (09/17/2023)   Received from [facility]   Humiliation, Afraid, Rape, and Kick questionnaire    Within the last year, have you been afraid of your partner or ex-partner?: No    Within the last year, have you been humiliated or emotionally abused in other ways by your partner or ex-partner?: No    Within the last year, have you been kicked, hit, slapped, or otherwise physically hurt by your partner or ex-partner?: No    Within the last year, have you been raped or forced to have any kind of sexual activity by your partner or ex-partner?: No    Review of Systems Constitutional: Patient denies any unintentional weight loss or change in strength lntegumentary: Patient denies any rashes or pruritus Cardiovascular: Patient denies chest pain or syncope Respiratory: Patient denies shortness of breath Gastrointestinal: Patient reports baseline dry mouth and constipation Musculoskeletal: Patient denies muscle cramps or weakness Neurologic: Patient denies convulsions or seizures Allergic/Immunologic: Patient denies recent allergic  reaction(s) Hematologic/Lymphatic: Patient denies bleeding tendencies Endocrine: Patient denies heat/cold intolerance  GU: As per HPI.  OBJECTIVE Vitals:   05/26/24 1318  BP: 133/69  Pulse: 94  Temp: 99.6 F (37.6 C)   There is no height or weight on file to calculate BMI.  Physical Examination Constitutional: No obvious distress; patient is non-toxic appearing  Cardiovascular: No visible lower extremity edema.  Respiratory: The patient does not have audible wheezing/stridor; respirations do not appear labored  Gastrointestinal: Abdomen non-distended Musculoskeletal: Normal ROM of UEs  Skin: No obvious rashes/open sores  Neurologic: CN 2-12 grossly intact Psychiatric: Answered questions appropriately with normal affect  Hematologic/Lymphatic/Immunologic: No obvious bruises or sites of spontaneous bleeding  > UA: 1+ blood > Urine microscopy: unremarkable (0-2 RBC/hpf)  PVR: 3 ml  ASSESSMENT OAB (overactive bladder) - Plan: Vibegron  (GEMTESA ) 75 MG TABS  Urge incontinence - Plan: Urinalysis, Routine w reflex microscopic, BLADDER SCAN AMB NON-IMAGING, Vibegron  (GEMTESA ) 75 MG TABS  Dry mouth  Constipation, unspecified constipation type  We discussed the symptoms of overactive bladder (OAB), which include urinary urgency, frequency, nocturia, with or without urge incontinence.  While we may not know the exact etiology of OAB, several risk factors can be identified.  - Patient's neurogenic risk factors: neuropathy and nicotine use.  - Patient's exacerbating factors include: caffeine intake.  We discussed the following management options in detail including potential benefits, risks, and side effects: Behavioral therapy: Modify fluid intake Minimize / avoid bladder irritants (such as caffeine) 2. Medication(s): - Not a safe candidate for anticholinergic medications due to risk for side effects based on patient's age, comorbidities, and pre-existing dry mouth and  constipation.  - Beta-3 agonist medications: Did well on Gemtesa . Will restart that with samples today and submit insurance coverage denial appeal for prescription.   We agreed to plan for follow up in 6 weeks or sooner if needed. Patient verbalized understanding of and agreement with current plan. All questions were answered.  PLAN Advised the following: 1. Restart Gemtesa  (Vibegron ) 75 mg daily. 2. Continue to minimize / avoid caffeine intake. 3. Return in about 6 weeks (around 07/07/2024) for OAB, with UA & PVR.  Orders Placed This Encounter  Procedures   Urinalysis, Routine w reflex microscopic   BLADDER SCAN AMB NON-IMAGING   Total time spent caring for the patient today was over 30 minutes. This includes time spent on the date of the visit reviewing the patient's chart before the visit, time spent during the visit, and time spent  after the visit on documentation. Over 50% of that time was spent in face-to-face time with this patient for direct counseling. E&M based on time and complexity of medical decision making.  It has been explained that the patient is to follow regularly with their PCP in addition to all other providers involved in their care and to follow instructions provided by these respective offices. Patient advised to contact urology clinic if any urologic-pertaining questions, concerns, new symptoms or problems arise in the interim period.  There are no Patient Instructions on file for this visit.  Electronically signed by:  Lauraine JAYSON Oz, FNP   05/26/24    2:03 PM

## 2024-05-27 ENCOUNTER — Telehealth: Payer: Self-pay

## 2024-05-27 ENCOUNTER — Ambulatory Visit: Payer: Self-pay | Admitting: Adult Health

## 2024-05-27 NOTE — Telephone Encounter (Signed)
-----   Message from Lauraine JAYSON Oz sent at 05/26/2024  2:00 PM EDT ----- Just resent prescription for Gemtesa . Her insurance previously denied it but with today's visit note we should be able to appeal that on the following basis: - Not a safe candidate for anticholinergic medications due to risk for side effects based on patient's age, comorbidities, and pre-existing dry mouth and constipation.  - Beta-3 agonist medications: Did well on Gemtesa  samples; we agreed to request appeal for insurance coverage denial of this medication.  Please proceed with insurance appeal. Thank you!

## 2024-05-27 NOTE — Telephone Encounter (Signed)
 Medication prior authorization request received.  Completed PA request through cover my meds for drug Gemtesa . KEY:  [id number]  Approved: Pending   NP's notes added to PA- Will update appeal with her response if PA is denied.

## 2024-05-27 NOTE — Telephone Encounter (Signed)
 FYI Only or Action Required?: FYI only for provider.  Patient is followed in Pulmonology for asthma/COPD, last seen on 05/12/2024 by Parrett, Madelin RAMAN, NP.  Called Nurse Triage reporting Thrush, Sore Throat, trouble swallowing, Hoarse, and redness in throat.  Symptoms began several weeks ago.  Interventions attempted: Prescription medications: mycelex  lozenges, Rescue inhaler, Maintenance inhaler, and Increased fluids/rest.  Symptoms are: unchanged.  Triage Disposition: See Today in Office (overriding Call PCP Within 24 Hours)  Patient/caregiver understands and will follow disposition?: No, refuses disposition - scheduled for tomorrow am per pt request but pt would prefer new script for stronger med     Advised pt be examined today, pt prefers change to stronger med without appt but for appt would prefer appt with Parrett NP next available in [place] even if a month out, advised pt be examined today, prefers Parrett NP appt tomorrow am, scheduled appt for tomorrow am, advised go to ED in meantime if swelling, not able to swallow liquids, or SOB more than usual, sending message for call back earlier if different recommendations from pulm    E2C2 Pulmonary Triage - Initial Assessment Questions "Chief Complaint (e.g., cough, sob, wheezing, fever, chills, sweat or additional symptoms) *Go to specific symptom protocol after initial questions.          Gave me lozenges on 6/24, so bad, felt like had to swallow several times, was really a bad case, hoarse from coughing so much, still red in throat No sores or blisters, no white to tongue Mostly landed in throat Having more SOB than usual for long time, not worse or improved since hospital Been really stressed for about 3 years now Speaking in full sentences Recent cardio issues and work-up No chest pain or heart racing but can't get my breath Inhaler helping some but could have not rinsed could from one of inhalers When swallow seems like  have to swallow 2-3 times to get drink down, still able to swallow liquids, not spitting up or drooling  "Have you used your inhalers/maintenance medication?" Yes If yes, "What medications?"      Breztri  Albuterol  inhaler Have nebulizer not used it yet  If inhaler, ask "How many puffs and how often?" Note: Review instructions on medication in the chart.     2-3x/day, does help  OXYGEN: "Do you wear supplemental oxygen?" No  "Do you monitor your oxygen levels?" Yes If yes, What is your reading (oxygen level) today?      92-93%  What is your usual oxygen saturation reading?  (Note: Pulmonary O2 sats should be 90% or greater)        Been higher for long time ago but 92-93% was good in hospital in May 92-93% for past 2 weeks  1. INFECTION: What infection is the antibiotic being given for?     thrush 2. ANTIBIOTIC/ANTIFUNGAL: What antibiotic/antifungal are you taking How many times per day?       Mycelex  lozenges 3. DURATION: When was the antibiotic started?       Couple weeks ago 5. BETTER-SAME-WORSE: Are you getting better, staying the same, or getting worse compared to when you first started the antibiotics? If getting worse, ask: In what way?          Not worse but not better 7. SYMPTOMS: Are there any other symptoms you're concerned about? If Yes, ask: When did it start?        Trouble swallowing Denies swelling to tongue/throat/under jaw Denies acute SOB since this began, same  level of SOB as at appt 6/24 and before that  Copied from CRM #[id number]. Topic: Clinical - Red Word Triage >> May 27, 2024 11:44 AM Leila C wrote: Red Word that prompted transfer to Nurse Triage: Patient [phone] states thrush has not improved much with lonzenger, the back of the throat sometimes burning like fire, painful, hoarse voice and feels at times like choking and asking for magic mouthwash would be better. Patient does have shortness of breath but has an inhaler. Patient  denies fever, nor dizziness. Patient called on 05/25/24 and has not heard from the office.   Patient asking if there's a smaller mask for the cpap machine? Patient thinks it will work better for her.   Patient is trying to gain weight, and heard there's a pill to have a better appetite. Patient states is taking the antidepressants which make her nausea sometimes and curbs her appetite. Patient saw NP, Parrett 05/12/24 and please advise. Reason for Disposition  [1] Taking antibiotic > 72 hours (3 days) AND [2] symptoms (other than fever) not improved  Protocols used: Infection on Antibiotic Follow-up Call-[facility]

## 2024-05-28 ENCOUNTER — Telehealth: Payer: Self-pay

## 2024-05-28 ENCOUNTER — Encounter (INDEPENDENT_AMBULATORY_CARE_PROVIDER_SITE_OTHER): Payer: Self-pay

## 2024-05-28 ENCOUNTER — Encounter: Payer: Self-pay | Admitting: Adult Health

## 2024-05-28 ENCOUNTER — Telehealth: Payer: Self-pay | Admitting: Internal Medicine

## 2024-05-28 ENCOUNTER — Ambulatory Visit (INDEPENDENT_AMBULATORY_CARE_PROVIDER_SITE_OTHER): Admitting: Adult Health

## 2024-05-28 VITALS — BP 156/77 | HR 85 | Ht 61.0 in | Wt 102.6 lb

## 2024-05-28 DIAGNOSIS — Z720 Tobacco use: Secondary | ICD-10-CM

## 2024-05-28 DIAGNOSIS — G4734 Idiopathic sleep related nonobstructive alveolar hypoventilation: Secondary | ICD-10-CM

## 2024-05-28 DIAGNOSIS — K219 Gastro-esophageal reflux disease without esophagitis: Secondary | ICD-10-CM | POA: Diagnosis not present

## 2024-05-28 DIAGNOSIS — B370 Candidal stomatitis: Secondary | ICD-10-CM

## 2024-05-28 DIAGNOSIS — R490 Dysphonia: Secondary | ICD-10-CM | POA: Diagnosis not present

## 2024-05-28 DIAGNOSIS — F17210 Nicotine dependence, cigarettes, uncomplicated: Secondary | ICD-10-CM

## 2024-05-28 DIAGNOSIS — J4489 Other specified chronic obstructive pulmonary disease: Secondary | ICD-10-CM

## 2024-05-28 LAB — LAB REPORT - SCANNED: A1c: 7

## 2024-05-28 MED ORDER — AZITHROMYCIN 250 MG PO TABS: ORAL_TABLET | ORAL | 0 refills | Status: AC

## 2024-05-28 MED ORDER — FLUCONAZOLE 100 MG PO TABS
100.0000 mg | ORAL_TABLET | Freq: Every day | ORAL | 0 refills | Status: AC
Start: 2024-05-28 — End: 2024-06-04

## 2024-05-28 NOTE — Assessment & Plan Note (Signed)
 Very minimum sleep apnea on sleep study.  Will check overnight oximetry test for nocturnal hypoxemia

## 2024-05-28 NOTE — Assessment & Plan Note (Signed)
 Smoking cessation discussed

## 2024-05-28 NOTE — Progress Notes (Signed)
 @Patient  ID: Christina Esparza, female    DOB: 1961/04/17, 63 y.o.   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    High-resolution CT chest April 27, 2021 showed no evidence of interstitial lung disease.  Stable emphysema.  A few scattered pulmonary nodules measuring 4 mm or less.  Unchanged and consistent with benign etiology.  LDCT chest -03/2024 -Emphysema, stable nodules max 4.23mm , Lung RADS 2    Spirometry 09/02/20 >> FEV1 1.47 (53%), FEV1% 59 PFT 10/28/20 >> FEV1 1.42 (63%), FEV1% 65, TLC 5.33 (115%), RV 3.10 (167%), DLCO 104% A1AT 10/28/20 >> 117, MZ A1At 01/2122 >>103  A1AT 05/15/24 107 , abs eos 100   HST 11/2023  showed very minimal sleep apnea with a AHI of 5.6/hour and an SpO2 low at 79%. Time spent below 88% was 40 minutes which was 19% of the total recording time.   05/28/2024 Acute OV : Sore throat , COPD  Patient presents for an acute office visit.  She complains that she continues to have some ongoing sore throat and hoarseness has been going on for a long time.  Patient was seen 3 weeks ago and diagnosed with oral candidiasis.  She was given Mycelex  for 1 week.  Patient says it did help but she continues to have some ongoing sore throat and daily hoarseness.  She is an active smoker.  She complains of ongoing nasal congestion drainage and discolored discharge.  Some ear fullness.  Has minimal cough.  No fever.  No hemoptysis.  Strep test today in the office is negative. Previous sleep study earlier this year showed very minimal sleep apnea with AHI at 5.  She was set up for an overnight oximetry  test which is pending.  She remains on Breztri  twice daily.   Allergies  Allergen Reactions   Penicillins Anaphylaxis    Felt like she was choking   Amitiza  [Lubiprostone ] Itching    Patient states that she experienced a pin sticking sensation, prickling all over.   Cat Dander Other (See Comments)    Almost like seasonal allergies    Codeine Nausea And Vomiting   Linaclotide Nausea Only   Omeprazole  Other (See Comments)    Only high doses Dizziness, felt funny   Trelegy Ellipta  [Fluticasone -Umeclidin-Vilant] Other (See Comments)    Burning in the throat   Ceftin [Cefuroxime] Itching   Dog Epithelium Other (See Comments)    Almost like seasonal allergies   Levaquin [Levofloxacin In D5w] Other (See Comments)    Made her dream and she usually doesn't usually dream     There is no immunization history on file for this patient.  Past Medical History:  Diagnosis Date   Anxiety    Asthma    Depression    Eczema    Urticaria     Tobacco History: Social History   Tobacco Use  Smoking Status Every Day   Current packs/day: 1.00   Average packs/day: 1 pack/day for 20.0 years (20.0 ttl pk-yrs)   Types: Cigarettes  Smokeless Tobacco Never  Tobacco Comments   Currently smokes a pack a day [facility] 06/19/21   Ready to quit:  Not Answered Counseling given: Not Answered Tobacco comments: Currently smokes a pack a day [facility] 06/19/21   Outpatient Medications Prior to Visit  Medication Sig Dispense Refill   ALPRAZolam  (XANAX ) 1 MG tablet Take 1 mg by mouth 3 (three) times daily as needed for anxiety.     azelastine  (ASTELIN ) 0.1 % nasal spray PLACE 1 SPRAY INTO BOTH NOSTRILS TWICE DAILY 30 mL 5   budesonide -glycopyrrolate -formoterol  (BREZTRI  AEROSPHERE) 160-9-4.8 MCG/ACT AERO inhaler Inhale 2 puffs into the lungs in the morning and at bedtime. 1 each 5   clotrimazole  (MYCELEX ) 10 MG troche Take 1 tablet (10 mg total) by mouth 5 (five) times daily. 35 Troche 0   EPINEPHrine  (AUVI-Q ) 0.3  mg/0.3 mL IJ SOAJ injection Inject 0.3 mg into the muscle as needed for anaphylaxis. 2 each 1   pantoprazole  (PROTONIX ) 40 MG tablet Take 1 tablet (40 mg total) by mouth 2 (two) times daily. 60 tablet 2   sertraline (ZOLOFT) 50 MG tablet Take 50 mg by mouth daily.     VENTOLIN  HFA 108 (90 Base) MCG/ACT inhaler Inhale 2 puffs into the lungs every 6 (six) hours as needed for wheezing or shortness of breath. 18 g 3   Vibegron  (GEMTESA ) 75 MG TABS Take 1 tablet (75 mg total) by mouth daily. 30 tablet 11   XIIDRA 5 % SOLN Apply 1 drop to eye every 12 (twelve) hours.     ALPRAZolam  (XANAX  XR) 0.5 MG 24 hr tablet Take 1 tablet (0.5 mg total) by mouth every evening. Hold medication until follow up with PCP/Psychiatrist     ALPRAZolam  (XANAX  XR) 1 MG 24 hr tablet Take 1 tablet (1 mg total) by mouth every morning. Hold this medication until further discussion with PCP/Psychiatrist     Facility-Administered Medications Prior to Visit  Medication Dose Route Frequency Provider Last Rate Last Admin   tezepelumab -ekko (TEZSPIRE ) 210 MG/1. syringe 210 mg  210 mg Subcutaneous Q28 days Iva Marty Saltness, MD   210 mg at 09/11/23 1044     Review of Systems:   Constitutional:   No  weight loss, night sweats,  Fevers, chills, +fatigue, or  lassitude.  HEENT:   No headaches,  Difficulty swallowing,  Tooth/dental problems, or  Sore throat,                No sneezing, itching, ear ache, nasal congestion, post nasal drip,   CV:  No chest pain,  Orthopnea, PND, swelling in lower extremities, anasarca, dizziness, palpitations, syncope.   GI  No heartburn, indigestion, abdominal pain, nausea, vomiting, diarrhea, change in bowel habits, loss of appetite, bloody stools.   Resp: .  No chest wall deformity  Skin: no rash or lesions.  GU: no dysuria, change in color of urine, no urgency or frequency.  No flank pain, no hematuria   MS:  No joint pain or swelling.  No decreased range of motion.  No back  pain.    Physical Exam  BP (!) 156/77   Pulse 85   Ht 5' 1 (1.549 m)   Wt 102 lb 9.6 oz (46.5 kg)   SpO2 94% Comment: RA  BMI 19.39 kg/m   GEN: A/Ox3; pleasant , NAD, thin female    HEENT:  [place]/AT,  NOSE-clear, THROAT-clear, no lesions, no postnasal drip or exudate noted.   NECK:  Supple w/ fair ROM; no JVD; normal carotid impulses w/o bruits; no thyromegaly or nodules palpated; no lymphadenopathy.    RESP  Clear  P & A;  w/o, wheezes/ rales/ or rhonchi. no accessory muscle use, no dullness to percussion  CARD:  RRR, no m/r/g, no peripheral edema, pulses intact, no cyanosis or clubbing.  GI:   Soft & nt; nml bowel sounds; no organomegaly or masses detected.   Musco: Warm bil, no deformities or joint swelling noted.   Neuro: alert, no focal deficits noted.    Skin: Warm, no lesions or rashes    Lab Results:  CBC   BMET   BNP No results found for: BNP  ProBNP No results found for: PROBNP  Imaging: No results found.  Administration History     None          Latest Ref Rng & Units 10/28/2020    9:37 AM  PFT Results  FVC-Pre L 2.05   FVC-Predicted Pre % 70   FVC-Post L 2.19   FVC-Predicted Post % 74   Pre FEV1/FVC % % 66   Post FEV1/FCV % % 65   FEV1-Pre L 1.36   FEV1-Predicted Pre % 60   FEV1-Post L 1.42   DLCO uncorrected ml/min/mmHg 19.00   DLCO UNC% % 104   DLCO corrected ml/min/mmHg 19.00   DLCO COR %Predicted % 104   DLVA Predicted % 128   TLC L 5.33   TLC % Predicted % 115   RV % Predicted % 167     No results found for: NITRICOXIDE      Assessment & Plan:   Asthma-COPD overlap syndrome (HCC) Possible exacerbation with early sinusitis.  Strep test is negative.  Will begin a Z-Pak.  Patient has multiple antibiotic allergies.  Use saline nasal rinses.   Plan  Patient Instructions  Strep test today.  ZPack take as directed  Diflucan  daily for 1 week.  Warm Salt water  gargles Twice daily   Continue with yearly CT  chest screening.  Continue on Breztri  2 puffs Twice daily, rinse after use. -Add spacer  Albuterol  inhaler as needed Activity as tolerated Work on not smoking Begin Claritin 5mg  At bedtime for 2 weeks and As needed   GERD diet  Continue on Protonix  daily  High protein diet.  Brush /rinse and gargle after inhaler use.  Refer to ENT for chronic hoarsness  Overnight oximetry test as planned  Follow up in 3 months with PFT and As needed       Laryngopharyngeal reflux Continue on a GERD diet.  Continue on Protonix  daily.  Oral candidiasis Treated for oral candidiasis last visit with 7-day course of Mycelex .  Patient continues to have some ongoing symptoms.  Exam is unrevealing.  Will use a 7-day course of Diflucan .  Add in spacer care for her inhaler.  Oral inhaler care discussed in detail.  Advised on salt water  gargles.  Plan  Patient Instructions  Strep test today.  ZPack take as directed  Diflucan  daily for 1 week.  Warm Salt water  gargles Twice daily   Continue with yearly CT chest screening.  Continue on Breztri  2 puffs Twice daily, rinse after use. -Add spacer  Albuterol  inhaler as needed Activity as tolerated Work on not smoking Begin Claritin 5mg  At bedtime for 2 weeks and As needed   GERD diet  Continue on Protonix  daily  High protein diet.  Brush /rinse and gargle after inhaler use.  Refer to ENT for chronic hoarsness  Overnight oximetry test as planned  Follow up in 3 months with PFT and As needed       Hoarseness Chronic hoarseness and active  smoker.  Referral to ENT.  Tobacco use Smoking cessation discussed.  Nocturnal hypoxemia Very minimum sleep apnea on sleep study.  Will check overnight oximetry test for nocturnal hypoxemia     Madelin Stank, NP 05/28/2024

## 2024-05-28 NOTE — Assessment & Plan Note (Signed)
 Treated for oral candidiasis last visit with 7-day course of Mycelex .  Patient continues to have some ongoing symptoms.  Exam is unrevealing.  Will use a 7-day course of Diflucan .  Add in spacer care for her inhaler.  Oral inhaler care discussed in detail.  Advised on salt water  gargles.  Plan  Patient Instructions  Strep test today.  ZPack take as directed  Diflucan  daily for 1 week.  Warm Salt water  gargles Twice daily   Continue with yearly CT chest screening.  Continue on Breztri  2 puffs Twice daily, rinse after use. -Add spacer  Albuterol  inhaler as needed Activity as tolerated Work on not smoking Begin Claritin 5mg  At bedtime for 2 weeks and As needed   GERD diet  Continue on Protonix  daily  High protein diet.  Brush /rinse and gargle after inhaler use.  Refer to ENT for chronic hoarsness  Overnight oximetry test as planned  Follow up in 3 months with PFT and As needed

## 2024-05-28 NOTE — Telephone Encounter (Signed)
 ATC x1 regarding her strep test result, left detailed message - DPR says that's fine

## 2024-05-28 NOTE — Telephone Encounter (Signed)
 LVM for patient regarding the Thursday 07/16/24 11:00 am PFT scheduled at [facility] time is 10:45 am 1st floor registration desk for check in.  Will mail appointment information and instructions to patient and requested return call with questions or concerns

## 2024-05-28 NOTE — Assessment & Plan Note (Signed)
 Continue on a GERD diet.  Continue on Protonix  daily.

## 2024-05-28 NOTE — Assessment & Plan Note (Signed)
 Possible exacerbation with early sinusitis.  Strep test is negative.  Will begin a Z-Pak.  Patient has multiple antibiotic allergies.  Use saline nasal rinses.   Plan  Patient Instructions  Strep test today.  ZPack take as directed  Diflucan  daily for 1 week.  Warm Salt water  gargles Twice daily   Continue with yearly CT chest screening.  Continue on Breztri  2 puffs Twice daily, rinse after use. -Add spacer  Albuterol  inhaler as needed Activity as tolerated Work on not smoking Begin Claritin 5mg  At bedtime for 2 weeks and As needed   GERD diet  Continue on Protonix  daily  High protein diet.  Brush /rinse and gargle after inhaler use.  Refer to ENT for chronic hoarsness  Overnight oximetry test as planned  Follow up in 3 months with PFT and As needed

## 2024-05-28 NOTE — Assessment & Plan Note (Signed)
 Chronic hoarseness and active smoker.  Referral to ENT.

## 2024-05-28 NOTE — Patient Instructions (Addendum)
 Strep test today.  ZPack take as directed  Diflucan  daily for 1 week.  Warm Salt water  gargles Twice daily   Continue with yearly CT chest screening.  Continue on Breztri  2 puffs Twice daily, rinse after use. -Add spacer  Albuterol  inhaler as needed Activity as tolerated Work on not smoking Begin Claritin 5mg  At bedtime for 2 weeks and As needed   GERD diet  Continue on Protonix  daily  High protein diet.  Brush /rinse and gargle after inhaler use.  Refer to ENT for chronic hoarsness  Overnight oximetry test as planned  Follow up in 3 months with PFT and As needed

## 2024-05-28 NOTE — Telephone Encounter (Signed)
 PT seen in [facility] office by Madelin Stank, NP today

## 2024-06-01 ENCOUNTER — Ambulatory Visit: Payer: Self-pay | Admitting: Allergy & Immunology

## 2024-06-01 DIAGNOSIS — J309 Allergic rhinitis, unspecified: Secondary | ICD-10-CM

## 2024-06-18 ENCOUNTER — Encounter (INDEPENDENT_AMBULATORY_CARE_PROVIDER_SITE_OTHER): Payer: Self-pay

## 2024-06-19 DEATH — deceased

## 2024-06-29 ENCOUNTER — Ambulatory Visit: Admitting: Urology

## 2024-07-02 ENCOUNTER — Telehealth: Payer: Self-pay

## 2024-07-02 NOTE — Telephone Encounter (Signed)
 Patient called with no answer. Detailed message left on voicemail concerning appointment change.

## 2024-07-10 ENCOUNTER — Ambulatory Visit: Admitting: Urology

## 2024-07-16 ENCOUNTER — Encounter (HOSPITAL_COMMUNITY)

## 2024-07-29 ENCOUNTER — Ambulatory Visit: Admitting: Urology
